# Patient Record
Sex: Female | Born: 1965 | Race: White | Hispanic: No | Marital: Married | Smoking: Former smoker
Health system: Southern US, Community
[De-identification: ages and names within clinical notes are randomized; demographics above are authoritative.]

## PROBLEM LIST (undated history)

## (undated) DIAGNOSIS — N6310 Unspecified lump in the right breast, unspecified quadrant: Secondary | ICD-10-CM

## (undated) DIAGNOSIS — C801 Malignant (primary) neoplasm, unspecified: Secondary | ICD-10-CM

## (undated) DIAGNOSIS — R519 Headache, unspecified: Secondary | ICD-10-CM

## (undated) DIAGNOSIS — Z800 Family history of malignant neoplasm of digestive organs: Secondary | ICD-10-CM

## (undated) DIAGNOSIS — D649 Anemia, unspecified: Secondary | ICD-10-CM

## (undated) DIAGNOSIS — Z9221 Personal history of antineoplastic chemotherapy: Secondary | ICD-10-CM

## (undated) DIAGNOSIS — G4730 Sleep apnea, unspecified: Secondary | ICD-10-CM

## (undated) DIAGNOSIS — K589 Irritable bowel syndrome without diarrhea: Secondary | ICD-10-CM

## (undated) DIAGNOSIS — K219 Gastro-esophageal reflux disease without esophagitis: Secondary | ICD-10-CM

## (undated) DIAGNOSIS — F419 Anxiety disorder, unspecified: Secondary | ICD-10-CM

## (undated) DIAGNOSIS — H469 Unspecified optic neuritis: Secondary | ICD-10-CM

## (undated) DIAGNOSIS — E039 Hypothyroidism, unspecified: Secondary | ICD-10-CM

## (undated) HISTORY — DX: Family history of malignant neoplasm of digestive organs: Z80.0

## (undated) HISTORY — DX: Unspecified lump in the right breast, unspecified quadrant: N63.10

## (undated) HISTORY — DX: Unspecified optic neuritis: H46.9

---

## 1989-05-02 HISTORY — PX: CHOLECYSTECTOMY: SHX55

## 1996-05-02 HISTORY — PX: TUBAL LIGATION: SHX77

## 2006-05-02 HISTORY — PX: ABDOMINAL HYSTERECTOMY: SHX81

## 2010-09-08 ENCOUNTER — Other Ambulatory Visit (HOSPITAL_COMMUNITY): Payer: Self-pay | Admitting: Internal Medicine

## 2010-09-08 DIAGNOSIS — E039 Hypothyroidism, unspecified: Secondary | ICD-10-CM

## 2010-09-14 ENCOUNTER — Ambulatory Visit (HOSPITAL_COMMUNITY)
Admission: RE | Admit: 2010-09-14 | Discharge: 2010-09-14 | Disposition: A | Discharge status: Home / Self Care | Payer: 59 | Source: Ambulatory Visit | Attending: Internal Medicine | Admitting: Internal Medicine

## 2010-09-14 DIAGNOSIS — E049 Nontoxic goiter, unspecified: Secondary | ICD-10-CM | POA: Insufficient documentation

## 2010-09-14 DIAGNOSIS — E039 Hypothyroidism, unspecified: Secondary | ICD-10-CM | POA: Insufficient documentation

## 2010-12-17 ENCOUNTER — Inpatient Hospital Stay (INDEPENDENT_AMBULATORY_CARE_PROVIDER_SITE_OTHER)
Admission: RE | Admit: 2010-12-17 | Discharge: 2010-12-17 | Disposition: A | Discharge status: Home / Self Care | Payer: 59 | Source: Ambulatory Visit | Attending: Family Medicine | Admitting: Family Medicine

## 2010-12-17 DIAGNOSIS — R109 Unspecified abdominal pain: Secondary | ICD-10-CM

## 2010-12-17 LAB — POCT URINALYSIS DIP (DEVICE)
Bilirubin Urine: NEGATIVE
Glucose, UA: NEGATIVE mg/dL
Ketones, ur: NEGATIVE mg/dL
Leukocytes, UA: NEGATIVE
Nitrite: NEGATIVE

## 2010-12-27 ENCOUNTER — Other Ambulatory Visit (HOSPITAL_COMMUNITY): Payer: Self-pay | Admitting: Internal Medicine

## 2010-12-27 DIAGNOSIS — R1031 Right lower quadrant pain: Secondary | ICD-10-CM

## 2010-12-28 ENCOUNTER — Other Ambulatory Visit (HOSPITAL_COMMUNITY): Payer: 59

## 2010-12-31 ENCOUNTER — Ambulatory Visit (HOSPITAL_COMMUNITY)
Admission: RE | Admit: 2010-12-31 | Discharge: 2010-12-31 | Disposition: A | Discharge status: Home / Self Care | Payer: 59 | Source: Ambulatory Visit | Attending: Internal Medicine | Admitting: Internal Medicine

## 2010-12-31 DIAGNOSIS — R1031 Right lower quadrant pain: Secondary | ICD-10-CM | POA: Insufficient documentation

## 2010-12-31 MED ORDER — IOHEXOL 300 MG/ML  SOLN
80.0000 mL | Freq: Once | INTRAMUSCULAR | Status: AC | PRN
  Administered 2010-12-31: 80 mL via INTRAVENOUS

## 2011-05-29 ENCOUNTER — Emergency Department (HOSPITAL_COMMUNITY)
Admission: EM | Admit: 2011-05-29 | Discharge: 2011-05-29 | Disposition: A | Discharge status: Home / Self Care | Payer: 59 | Source: Home / Self Care | Attending: Emergency Medicine | Admitting: Emergency Medicine

## 2011-05-29 ENCOUNTER — Encounter (HOSPITAL_COMMUNITY): Payer: Self-pay

## 2011-05-29 ENCOUNTER — Emergency Department (INDEPENDENT_AMBULATORY_CARE_PROVIDER_SITE_OTHER): Payer: 59

## 2011-05-29 DIAGNOSIS — S93609A Unspecified sprain of unspecified foot, initial encounter: Secondary | ICD-10-CM

## 2011-05-29 DIAGNOSIS — S93602A Unspecified sprain of left foot, initial encounter: Secondary | ICD-10-CM

## 2011-05-29 HISTORY — DX: Gastro-esophageal reflux disease without esophagitis: K21.9

## 2011-05-29 HISTORY — DX: Hypothyroidism, unspecified: E03.9

## 2011-05-29 HISTORY — DX: Anxiety disorder, unspecified: F41.9

## 2011-05-29 MED ORDER — HYDROCODONE-ACETAMINOPHEN 5-325 MG PO TABS: 2.0000 | ORAL_TABLET | ORAL | Status: AC | PRN

## 2011-05-29 NOTE — ED Provider Notes (Signed)
History     CSN: [id number]  Arrival date & time 05/29/11  0901   First MD Initiated Contact with Patient 05/29/11 [phone]      Chief Complaint  Patient presents with  . Foot Pain    (Consider location/radiation/quality/duration/timing/severity/associated sxs/prior treatment) HPI Comments: Patient reports one week of dull, achy, lateral left foot pain. Reports increased physical activity, but does not recall any specific trauma. Pain worse with ambulation. Has been taking 800 mg of ibuprofen with temporary relief, however states pain is getting worse. Notes some foot swelling starting of the past few days. No fevers, redness, bruising, gross deformity, numbness, weakness. No history of injury to his foot.  ROS as noted in HPI. All other ROS negative.   Patient is a 46 y.o. female presenting with lower extremity pain. The history is provided by the patient.  Foot Pain This is a new problem. The current episode started more than 1 week ago. The problem occurs constantly. The problem has been gradually worsening. The symptoms are aggravated by walking. The symptoms are relieved by NSAIDs. The treatment provided mild relief.    Past Medical History  Diagnosis Date  . Acid reflux   . Anxiety   . Hypothyroid     History reviewed. No pertinent past surgical history.  History reviewed. No pertinent family history.  History  Substance Use Topics  . Smoking status: Never Smoker   . Smokeless tobacco: Not on file  . Alcohol Use: Yes    OB History    Grav Para Term Preterm Abortions TAB SAB Ect Mult Living                  Review of Systems  Allergies  Codeine; Wellbutrin; and Zanaflex  Home Medications   Current Outpatient Rx  Name Route Sig Dispense Refill  . ESCITALOPRAM OXALATE 20 MG PO TABS Oral Take 20 mg by mouth daily.    Marland Kitchen ESOMEPRAZOLE MAGNESIUM 40 MG PO CPDR Oral Take 40 mg by mouth daily before breakfast.    . IBUPROFEN 800 MG PO TABS Oral Take 800 mg by mouth  every 8 (eight) hours as needed.    Marland Kitchen PRESCRIPTION MEDICATION  tirosent for her thyroid      BP 145/92  Pulse 86  Temp(Src) 98 F (36.7 C) (Oral)  Resp 16  SpO2 98%  Physical Exam  Nursing note and vitals reviewed. Constitutional: She is oriented to person, place, and time. She appears well-developed and well-nourished. No distress.  HENT:  Head: Normocephalic and atraumatic.  Eyes: Conjunctivae and EOM are normal.  Neck: Normal range of motion.  Cardiovascular: Regular rhythm.   Pulmonary/Chest: Effort normal.  Abdominal: She exhibits no distension.  Musculoskeletal: Normal range of motion.       Feet:       Left foot trace edema. Point tenderness base left fifth metatarsal. Ankle, heel, midfoot, toes within normal limits. No tenderness along plantar fascia. DP 2+. No bruising, redness. Skin intact.  Neurological: She is alert and oriented to person, place, and time.  Skin: Skin is warm and dry.  Psychiatric: She has a normal mood and affect. Her behavior is normal. Judgment and thought content normal.    ED Course  Procedures (including critical care time)  Labs Reviewed - No data to display Dg Foot Complete Left  05/29/2011  *RADIOLOGY REPORT*  Clinical Data: Lateral foot pain  LEFT FOOT - COMPLETE 3+ VIEW  Comparison: None  Findings: There is no evidence of fracture  or dislocation. There is a plantar heel spur.  There is no evidence of arthropathy or other focal bone abnormality.  Soft tissues are unremarkable.  IMPRESSION: 1.  Plantar heel spur. 2.  No acute findings.  Original Report Authenticated By: Rosealee Albee, M.D.     No diagnosis found. X-ray reviewed by myself. Report per radiologist.  MDM  Attending x-ray to rule out fracture. Suspect either stress fracture versus overuse. Will send patient home was stiff soled shoe, versus ASO , plus minus crutches. Will have her continue 800 mg ibuprofen 3 times a day. Sent home with Norco. Patient followup with [facility]  medicine center or Dr. Carola Frost, orthopedics on call in a week.   Luiz Blare, MD 05/29/11 1008

## 2011-05-29 NOTE — ED Notes (Signed)
Pt has lt foot pain for one week, no known injury.  Painful with ambulation.

## 2012-05-02 HISTORY — PX: BREAST EXCISIONAL BIOPSY: SUR124

## 2013-02-25 ENCOUNTER — Telehealth (INDEPENDENT_AMBULATORY_CARE_PROVIDER_SITE_OTHER): Payer: Self-pay | Admitting: General Surgery

## 2013-02-25 NOTE — Telephone Encounter (Signed)
LMOM for patient to call back and ask for me or triage. Calling to see if patient has a CD of mammogram and ultrasound from [facility]. I would like to have patient bring these to her appt on Wednesday if possible. Awaiting call back.

## 2013-02-26 ENCOUNTER — Telehealth (INDEPENDENT_AMBULATORY_CARE_PROVIDER_SITE_OTHER): Payer: Self-pay | Admitting: General Surgery

## 2013-02-26 NOTE — Telephone Encounter (Signed)
Patient called back and she will have her Mgm CD with her tomorrow for her Dr apt

## 2013-02-27 ENCOUNTER — Encounter (INDEPENDENT_AMBULATORY_CARE_PROVIDER_SITE_OTHER): Payer: Self-pay | Admitting: Surgery

## 2013-02-27 ENCOUNTER — Ambulatory Visit (INDEPENDENT_AMBULATORY_CARE_PROVIDER_SITE_OTHER): Payer: Commercial Managed Care - PPO | Admitting: Surgery

## 2013-02-27 VITALS — BP 128/74 | HR 76 | Temp 98.4°F | Resp 15 | Ht 71.0 in | Wt 251.4 lb

## 2013-02-27 DIAGNOSIS — N6310 Unspecified lump in the right breast, unspecified quadrant: Secondary | ICD-10-CM

## 2013-02-27 DIAGNOSIS — N63 Unspecified lump in unspecified breast: Secondary | ICD-10-CM

## 2013-02-27 HISTORY — DX: Unspecified lump in the right breast, unspecified quadrant: N63.10

## 2013-02-27 NOTE — Progress Notes (Signed)
NAME: Natalie Burton DOB: 07/29/1965 MRN: [id number]                                                                                      DATE: 02/27/2013  PCP: Melissa Joyce Brown-Patram Referring Provider: No ref. 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  I have discussed the indications for the lumpectomy and described the procedure. She understand that the chance of removal of the abnormal area is very good, but that occasionally we are unable to locate it and may have to do a second procedure. We also discussed the possibility of a second procedure to get additional tissue. Risks of surgery such as bleeding and infection have also been explained, as well as the implications of not doing the surgery. She understands and wishes to proceed.                  CC:  Chief Complaint  Patient presents with  . Breast Mass    Right    HPI:  Natalie Burton is a 47 y.o.  female who presents for evaluation of A right breast mass recently found.she was referred from her primary care physician. She had a mammogram which showed a potential abnormality and after further evaluation an ultrasound a small right subareolar mass was found. A needle core biopsy was done showing a papilloma. Surgical biopsy was recommended. She does not have any breast symptoms or breast problems. She is not had any prior breast surgery. Her mother apparently has had a mastectomy for breast cancer developed at age 55.  PMH:  has a past medical history of Acid reflux; Anxiety; and Hypothyroid.  PSH:   has past surgical history that includes Cholecystectomy; Tubal ligation; and Abdominal hysterectomy.  ALLERGIES:   Allergies  Allergen Reactions  . Codeine   . Wellbutrin [Bupropion Hcl]   . Zanaflex [Tizanidine Hydrochloride]     MEDICATIONS: Current outpatient prescriptions:Dexlansoprazole (DEXILANT PO), Take by  mouth., Disp: , Rfl: ;  esomeprazole (NEXIUM) 40 MG capsule, Take 40 mg by mouth daily before breakfast., Disp: , Rfl: ;  ibuprofen (ADVIL,MOTRIN) 800 MG tablet, Take 800 mg by mouth every 8 (eight) hours as needed., Disp: , Rfl: ;  levothyroxine (SYNTHROID, LEVOTHROID) 175 MCG tablet, Take 175 mcg by mouth daily before breakfast., Disp: , Rfl:  escitalopram (LEXAPRO) 20 MG tablet, Take 20 mg by mouth daily., Disp: , Rfl:   ROS: She has filled out our 12 point review of systems and it is negative . EXAM:   VITAL SIGNS:  BP 128/74  Pulse 76  Temp(Src) 98.4 F (36.9 C) (Temporal)  Resp 15  Ht 5' 11" (1.803 m)  Wt 251 lb 6.4 oz (114.034 kg)  BMI 35.08 kg/m2  GENERAL:  The patient is alert, oriented, and generally healthy-appearing, NAD. Mood and affect are normal.  HEENT:  The head is normocephalic, the eyes nonicteric, the pupils were round regular and equal. EOMs are normal. Pharynx normal. Dentition good.  NECK:  The neck is supple and there are no masses or thyromegaly.    LUNGS: Normal respirations and clear to auscultation.  HEART: Regular rhythm, with no murmurs rubs or gallops. Pulses are intact carotid dorsalis pedis and posterior tibial. No significant varicosities are noted.  BREASTS:  the breasts are symmetric in size and shape. They're nontender. There is no mass. The nipple and areolar areas appear normal.  LYMPHATICS: There is no axillary or supraclavicular adenopathy on either side.  ABDOMEN: Soft, flat, and nontender. No masses or organomegaly is noted. No hernias are noted. Bowel sounds are normal.  EXTREMITIES:  Good range of motion, no edema.   DATA REVIEWED:  I have reviewed the mammogram, ultrasound, pathology reports as well as her recent physical exam by her primary care provider.    Geneen Dieter J 02/27/2013  CC: No ref. provider found, Melissa Joyce Brown-Patram        

## 2013-02-27 NOTE — Patient Instructions (Signed)
Schedule surgery to remove the small papilloma from your right breast

## 2013-03-05 ENCOUNTER — Encounter (INDEPENDENT_AMBULATORY_CARE_PROVIDER_SITE_OTHER): Payer: Self-pay

## 2013-03-13 ENCOUNTER — Encounter (HOSPITAL_BASED_OUTPATIENT_CLINIC_OR_DEPARTMENT_OTHER): Payer: Self-pay | Admitting: *Deleted

## 2013-03-13 NOTE — Progress Notes (Signed)
No labs needed [facility]

## 2013-03-18 ENCOUNTER — Ambulatory Visit (HOSPITAL_BASED_OUTPATIENT_CLINIC_OR_DEPARTMENT_OTHER): Payer: 59 | Admitting: Certified Registered Nurse Anesthetist

## 2013-03-18 ENCOUNTER — Encounter (HOSPITAL_BASED_OUTPATIENT_CLINIC_OR_DEPARTMENT_OTHER): Payer: Self-pay | Admitting: Certified Registered Nurse Anesthetist

## 2013-03-18 ENCOUNTER — Ambulatory Visit
Admission: RE | Admit: 2013-03-18 | Discharge: 2013-03-18 | Disposition: A | Discharge status: Home / Self Care | Payer: 59 | Source: Ambulatory Visit | Attending: Surgery | Admitting: Surgery

## 2013-03-18 ENCOUNTER — Encounter (HOSPITAL_BASED_OUTPATIENT_CLINIC_OR_DEPARTMENT_OTHER): Payer: 59 | Admitting: Certified Registered Nurse Anesthetist

## 2013-03-18 ENCOUNTER — Ambulatory Visit (HOSPITAL_BASED_OUTPATIENT_CLINIC_OR_DEPARTMENT_OTHER)
Admission: RE | Admit: 2013-03-18 | Discharge: 2013-03-18 | Disposition: A | Discharge status: Home / Self Care | Payer: 59 | Source: Ambulatory Visit | Attending: Surgery | Admitting: Surgery

## 2013-03-18 ENCOUNTER — Encounter (HOSPITAL_BASED_OUTPATIENT_CLINIC_OR_DEPARTMENT_OTHER)
Admission: RE | Disposition: A | Discharge status: Home / Self Care | Payer: Self-pay | Source: Ambulatory Visit | Attending: Surgery

## 2013-03-18 DIAGNOSIS — E039 Hypothyroidism, unspecified: Secondary | ICD-10-CM | POA: Insufficient documentation

## 2013-03-18 DIAGNOSIS — K219 Gastro-esophageal reflux disease without esophagitis: Secondary | ICD-10-CM | POA: Insufficient documentation

## 2013-03-18 DIAGNOSIS — F411 Generalized anxiety disorder: Secondary | ICD-10-CM | POA: Insufficient documentation

## 2013-03-18 DIAGNOSIS — Z803 Family history of malignant neoplasm of breast: Secondary | ICD-10-CM | POA: Insufficient documentation

## 2013-03-18 DIAGNOSIS — N6310 Unspecified lump in the right breast, unspecified quadrant: Secondary | ICD-10-CM

## 2013-03-18 DIAGNOSIS — N6089 Other benign mammary dysplasias of unspecified breast: Secondary | ICD-10-CM

## 2013-03-18 DIAGNOSIS — D249 Benign neoplasm of unspecified breast: Secondary | ICD-10-CM

## 2013-03-18 DIAGNOSIS — F17200 Nicotine dependence, unspecified, uncomplicated: Secondary | ICD-10-CM | POA: Insufficient documentation

## 2013-03-18 HISTORY — PX: BREAST LUMPECTOMY WITH NEEDLE LOCALIZATION: SHX5759

## 2013-03-18 HISTORY — DX: Sleep apnea, unspecified: G47.30

## 2013-03-18 SURGERY — BREAST LUMPECTOMY WITH NEEDLE LOCALIZATION
Anesthesia: General | Site: Breast | Laterality: Right | Wound class: Clean

## 2013-03-18 MED ORDER — HYDROMORPHONE HCL PF 1 MG/ML IJ SOLN
INTRAMUSCULAR | Status: AC
  Filled 2013-03-18: qty 1

## 2013-03-18 MED ORDER — MIDAZOLAM HCL 5 MG/5ML IJ SOLN
INTRAMUSCULAR | Status: DC | PRN
  Administered 2013-03-18: 2 mg via INTRAVENOUS

## 2013-03-18 MED ORDER — OXYCODONE HCL 5 MG/5ML PO SOLN: 5.0000 mg | Freq: Once | ORAL | Status: DC | PRN

## 2013-03-18 MED ORDER — FENTANYL CITRATE 0.05 MG/ML IJ SOLN
INTRAMUSCULAR | Status: DC | PRN
  Administered 2013-03-18 (×2): 50 ug via INTRAVENOUS

## 2013-03-18 MED ORDER — OXYCODONE HCL 5 MG PO TABS: 5.0000 mg | ORAL_TABLET | Freq: Once | ORAL | Status: DC | PRN

## 2013-03-18 MED ORDER — CHLORHEXIDINE GLUCONATE 4 % EX LIQD: 1.0000 "application " | Freq: Once | CUTANEOUS | Status: DC

## 2013-03-18 MED ORDER — PROPOFOL 10 MG/ML IV BOLUS
INTRAVENOUS | Status: DC | PRN
  Administered 2013-03-18: 150 mg via INTRAVENOUS

## 2013-03-18 MED ORDER — CEFAZOLIN SODIUM-DEXTROSE 2-3 GM-% IV SOLR
2.0000 g | INTRAVENOUS | Status: AC
  Administered 2013-03-18: 2 g via INTRAVENOUS

## 2013-03-18 MED ORDER — BUPIVACAINE HCL (PF) 0.25 % IJ SOLN
INTRAMUSCULAR | Status: AC
  Filled 2013-03-18: qty 30

## 2013-03-18 MED ORDER — ONDANSETRON HCL 4 MG/2ML IJ SOLN
INTRAMUSCULAR | Status: DC | PRN
  Administered 2013-03-18: 4 mg via INTRAVENOUS

## 2013-03-18 MED ORDER — DEXAMETHASONE SODIUM PHOSPHATE 4 MG/ML IJ SOLN
INTRAMUSCULAR | Status: DC | PRN
  Administered 2013-03-18: 10 mg via INTRAVENOUS

## 2013-03-18 MED ORDER — MIDAZOLAM HCL 2 MG/2ML IJ SOLN: 1.0000 mg | INTRAMUSCULAR | Status: DC | PRN

## 2013-03-18 MED ORDER — OXYCODONE-ACETAMINOPHEN 5-325 MG PO TABS: 1.0000 | ORAL_TABLET | ORAL | Status: DC | PRN

## 2013-03-18 MED ORDER — MIDAZOLAM HCL 2 MG/2ML IJ SOLN
INTRAMUSCULAR | Status: AC
  Filled 2013-03-18: qty 2

## 2013-03-18 MED ORDER — FENTANYL CITRATE 0.05 MG/ML IJ SOLN
INTRAMUSCULAR | Status: AC
  Filled 2013-03-18: qty 6

## 2013-03-18 MED ORDER — LACTATED RINGERS IV SOLN
INTRAVENOUS | Status: DC
  Administered 2013-03-18: 09:00:00 via INTRAVENOUS
  Administered 2013-03-18: 20 mL/h via INTRAVENOUS

## 2013-03-18 MED ORDER — ONDANSETRON HCL 4 MG/2ML IJ SOLN: 4.0000 mg | Freq: Once | INTRAMUSCULAR | Status: DC | PRN

## 2013-03-18 MED ORDER — MEPERIDINE HCL 25 MG/ML IJ SOLN: 6.2500 mg | INTRAMUSCULAR | Status: DC | PRN

## 2013-03-18 MED ORDER — HYDROMORPHONE HCL PF 1 MG/ML IJ SOLN
0.2500 mg | INTRAMUSCULAR | Status: DC | PRN
  Administered 2013-03-18 (×2): 0.5 mg via INTRAVENOUS

## 2013-03-18 MED ORDER — FENTANYL CITRATE 0.05 MG/ML IJ SOLN: 50.0000 ug | INTRAMUSCULAR | Status: DC | PRN

## 2013-03-18 MED ORDER — CEFAZOLIN SODIUM 1-5 GM-% IV SOLN
INTRAVENOUS | Status: AC
  Filled 2013-03-18: qty 100

## 2013-03-18 MED ORDER — PROPOFOL 10 MG/ML IV EMUL
INTRAVENOUS | Status: AC
  Filled 2013-03-18: qty 50

## 2013-03-18 MED ORDER — LIDOCAINE HCL (CARDIAC) 20 MG/ML IV SOLN
INTRAVENOUS | Status: DC | PRN
  Administered 2013-03-18: 60 mg via INTRAVENOUS

## 2013-03-18 MED ORDER — BUPIVACAINE HCL (PF) 0.25 % IJ SOLN
INTRAMUSCULAR | Status: DC | PRN
  Administered 2013-03-18: 20 mL

## 2013-03-18 SURGICAL SUPPLY — 46 items
APPLICATOR COTTON TIP 6IN STRL (MISCELLANEOUS) IMPLANT
BINDER BREAST LRG (GAUZE/BANDAGES/DRESSINGS) IMPLANT
BINDER BREAST MEDIUM (GAUZE/BANDAGES/DRESSINGS) IMPLANT
BINDER BREAST XLRG (GAUZE/BANDAGES/DRESSINGS) ×2 IMPLANT
BINDER BREAST XXLRG (GAUZE/BANDAGES/DRESSINGS) IMPLANT
BLADE HEX COATED 2.75 (ELECTRODE) ×2 IMPLANT
BLADE SURG 15 STRL LF DISP TIS (BLADE) ×1 IMPLANT
BLADE SURG 15 STRL SS (BLADE) ×1
CANISTER SUCT 1200ML W/VALVE (MISCELLANEOUS) ×2 IMPLANT
CHLORAPREP W/TINT 26ML (MISCELLANEOUS) ×2 IMPLANT
CLIP TI MEDIUM 6 (CLIP) IMPLANT
CLIP TI WIDE RED SMALL 6 (CLIP) IMPLANT
COVER MAYO STAND STRL (DRAPES) ×2 IMPLANT
COVER TABLE BACK 60X90 (DRAPES) ×2 IMPLANT
DECANTER SPIKE VIAL GLASS SM (MISCELLANEOUS) IMPLANT
DERMABOND ADVANCED (GAUZE/BANDAGES/DRESSINGS) ×1
DERMABOND ADVANCED .7 DNX12 (GAUZE/BANDAGES/DRESSINGS) ×1 IMPLANT
DEVICE DUBIN W/COMP PLATE 8390 (MISCELLANEOUS) ×2 IMPLANT
DRAPE LAPAROTOMY TRNSV 102X78 (DRAPE) ×2 IMPLANT
DRAPE SURG 17X23 STRL (DRAPES) IMPLANT
DRAPE UTILITY XL STRL (DRAPES) ×2 IMPLANT
ELECT REM PT RETURN 9FT ADLT (ELECTROSURGICAL) ×2
ELECTRODE REM PT RTRN 9FT ADLT (ELECTROSURGICAL) ×1 IMPLANT
GLOVE BIO SURGEON STRL SZ 6.5 (GLOVE) ×2 IMPLANT
GLOVE BIOGEL PI IND STRL 7.0 (GLOVE) ×1 IMPLANT
GLOVE BIOGEL PI INDICATOR 7.0 (GLOVE) ×1
GLOVE EUDERMIC 7 POWDERFREE (GLOVE) ×2 IMPLANT
GOWN PREVENTION PLUS XLARGE (GOWN DISPOSABLE) ×4 IMPLANT
KIT MARKER MARGIN INK (KITS) IMPLANT
NEEDLE HYPO 25X1 1.5 SAFETY (NEEDLE) ×2 IMPLANT
NS IRRIG 1000ML POUR BTL (IV SOLUTION) ×2 IMPLANT
PACK BASIN DAY SURGERY FS (CUSTOM PROCEDURE TRAY) ×2 IMPLANT
PENCIL BUTTON HOLSTER BLD 10FT (ELECTRODE) ×2 IMPLANT
SHEET MEDIUM DRAPE 40X70 STRL (DRAPES) IMPLANT
SLEEVE SCD COMPRESS KNEE MED (MISCELLANEOUS) ×2 IMPLANT
SPONGE GAUZE 4X4 12PLY (GAUZE/BANDAGES/DRESSINGS) IMPLANT
SPONGE INTESTINAL PEANUT (DISPOSABLE) IMPLANT
SPONGE LAP 4X18 X RAY DECT (DISPOSABLE) ×2 IMPLANT
STAPLER VISISTAT 35W (STAPLE) IMPLANT
SUT MNCRL AB 4-0 PS2 18 (SUTURE) ×2 IMPLANT
SUT SILK 0 TIES 10X30 (SUTURE) IMPLANT
SUT VICRYL 3-0 CR8 SH (SUTURE) ×2 IMPLANT
SYR CONTROL 10ML LL (SYRINGE) ×2 IMPLANT
TOWEL OR NON WOVEN STRL DISP B (DISPOSABLE) IMPLANT
TUBE CONNECTING 20X1/4 (TUBING) ×2 IMPLANT
YANKAUER SUCT BULB TIP NO VENT (SUCTIONS) ×2 IMPLANT

## 2013-03-18 NOTE — Interval H&P Note (Signed)
History and Physical Interval Note:  03/18/2013 8:46 AM  Natalie Burton  has presented today for surgery, with the diagnosis of breast mass  The various methods of treatment have been discussed with the patient and family. After consideration of risks, benefits and other options for treatment, the patient has consented to  Procedure(s): BREAST LUMPECTOMY WITH NEEDLE LOCALIZATION (Right) as a surgical intervention .  The patient's history has been reviewed, patient examined, no change in status, stable for surgery.  I have reviewed the patient's chart and labs.  Questions were answered to the patient's satisfaction.     Chanise Habeck J

## 2013-03-18 NOTE — Anesthesia Procedure Notes (Signed)
Procedure Name: LMA Insertion Date/Time: 03/18/2013 9:15 AM Performed by: Currie Paris Pre-anesthesia Checklist: Patient identified, Emergency Drugs available, Suction available and Patient being monitored Patient Re-evaluated:Patient Re-evaluated prior to inductionOxygen Delivery Method: Circle System Utilized Preoxygenation: Pre-oxygenation with 100% oxygen Intubation Type: IV induction Ventilation: Mask ventilation without difficulty LMA: LMA inserted LMA Size: 4.0 Number of attempts: 1 Airway Equipment and Method: bite block Placement Confirmation: positive ETCO2 Tube secured with: Tape Dental Injury: Teeth and Oropharynx as per pre-operative assessment

## 2013-03-18 NOTE — Addendum Note (Signed)
Addendum created 03/18/13 1319 by Arta Bruce, MD   Modules edited: Anesthesia Attestations

## 2013-03-18 NOTE — H&P (View-Only) (Signed)
NAME: Natalie Burton DOB: 05-28-1965 MRN: [id number]                                                                                      DATE: 02/27/2013  PCP: Krystal Clark Referring Provider: No ref. 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  I have discussed the indications for the lumpectomy and described the procedure. She understand that the chance of removal of the abnormal area is very good, but that occasionally we are unable to locate it and may have to do a second procedure. We also discussed the possibility of a second procedure to get additional tissue. Risks of surgery such as bleeding and infection have also been explained, as well as the implications of not doing the surgery. She understands and wishes to proceed.                  CC:  Chief Complaint  Patient presents with  . Breast Mass    Right    HPI:  Natalie Burton is a 47 y.o.  female who presents for evaluation of A right breast mass recently found.she was referred from her primary care physician. She had a mammogram which showed a potential abnormality and after further evaluation an ultrasound a small right subareolar mass was found. A needle core biopsy was done showing a papilloma. Surgical biopsy was recommended. She does not have any breast symptoms or breast problems. She is not had any prior breast surgery. Her mother apparently has had a mastectomy for breast cancer developed at age 59.  PMH:  has a past medical history of Acid reflux; Anxiety; and Hypothyroid.  PSH:   has past surgical history that includes Cholecystectomy; Tubal ligation; and Abdominal hysterectomy.  ALLERGIES:   Allergies  Allergen Reactions  . Codeine   . Wellbutrin [Bupropion Hcl]   . Zanaflex [Tizanidine Hydrochloride]     MEDICATIONS: Current outpatient prescriptions:Dexlansoprazole (DEXILANT PO), Take by  mouth., Disp: , Rfl: ;  esomeprazole (NEXIUM) 40 MG capsule, Take 40 mg by mouth daily before breakfast., Disp: , Rfl: ;  ibuprofen (ADVIL,MOTRIN) 800 MG tablet, Take 800 mg by mouth every 8 (eight) hours as needed., Disp: , Rfl: ;  levothyroxine (SYNTHROID, LEVOTHROID) 175 MCG tablet, Take 175 mcg by mouth daily before breakfast., Disp: , Rfl:  escitalopram (LEXAPRO) 20 MG tablet, Take 20 mg by mouth daily., Disp: , Rfl:   ROS: She has filled out our 12 point review of systems and it is negative . EXAM:   VITAL SIGNS:  BP 128/74  Pulse 76  Temp(Src) 98.4 F (36.9 C) (Temporal)  Resp 15  Ht 5\' 11"  (1.803 m)  Wt 251 lb 6.4 oz (114.034 kg)  BMI 35.08 kg/m2  GENERAL:  The patient is alert, oriented, and generally healthy-appearing, NAD. Mood and affect are normal.  HEENT:  The head is normocephalic, the eyes nonicteric, the pupils were round regular and equal. EOMs are normal. Pharynx normal. Dentition good.  NECK:  The neck is supple and there are no masses or thyromegaly.  LUNGS: Normal respirations and clear to auscultation.  HEART: Regular rhythm, with no murmurs rubs or gallops. Pulses are intact carotid dorsalis pedis and posterior tibial. No significant varicosities are noted.  BREASTS:  the breasts are symmetric in size and shape. They're nontender. There is no mass. The nipple and areolar areas appear normal.  LYMPHATICS: There is no axillary or supraclavicular adenopathy on either side.  ABDOMEN: Soft, flat, and nontender. No masses or organomegaly is noted. No hernias are noted. Bowel sounds are normal.  EXTREMITIES:  Good range of motion, no edema.   DATA REVIEWED:  I have reviewed the mammogram, ultrasound, pathology reports as well as her recent physical exam by her primary care provider.    Mikala Podoll J 02/27/2013  CC: No ref. provider found, Melissa Richardson Dopp

## 2013-03-18 NOTE — Transfer of Care (Signed)
Immediate Anesthesia Transfer of Care Note  Patient: Natalie Burton  Procedure(s) Performed: Procedure(s): BREAST LUMPECTOMY WITH NEEDLE LOCALIZATION (Right)  Patient Location: PACU  Anesthesia Type:General  Level of Consciousness: awake, alert , oriented and patient cooperative  Airway & Oxygen Therapy: Patient Spontanous Breathing and Patient connected to face mask oxygen  Post-op Assessment: Report given to PACU RN and Post -op Vital signs reviewed and stable  Post vital signs: Reviewed and stable  Complications: No apparent anesthesia complications

## 2013-03-18 NOTE — Op Note (Signed)
Natalie Burton  1965/05/10  [id number]  03/18/2013   Preoperative diagnosis: papilloma, right subareolar area  Postoperative diagnosis: same  Procedure: wire localized removal of right breast papilloma  Surgeon: Currie Paris, MD, FACS  Anesthesia: GA combined with regional for post-op pain  Clinical History and Indications: this patient presents for a guidewire localized excision of a right breast Mass, papilloma by needle core biopsy.  Description of procedure: The patient was seen in the holding area and the plans for the procedure reviewed. The right breast was marked as the operative side. The wire localizing films were reviewed.  The patient was taken to the operating room and after satisfactory general anesthesia had been obtained the right breast was prepped and draped and the timeout was performed.  The guidewire entered laterally and tracked towards the nipple. The clip from the biopsy appeared to be subareolar. I made a curvilinear incision at the lateral aspect of the areola, manipulated the guidewire into the wound, grasped the breast tissue around the guidewire and excise the tissue around it going beyond the tip.in the very superficial area I saw what appeared to be an old hematoma from her prior biopsy.  After achieving hemostasis, I infiltrated the area with 20 cc of 0.25% Marcaine and then the incision was closed with 3-0 Vicryl, 4-0 Monocryl subcuticular, and Dermabond.  The patient tolerated the procedure well. There were no operative complications. All counts were correct. Specimen mammogram showed the clip in the specimen.  EBL: minimal  Currie Paris, MD, FACS 03/18/2013 9:59 AM

## 2013-03-18 NOTE — Anesthesia Postprocedure Evaluation (Signed)
Anesthesia Post Note  Patient: Natalie Burton  Procedure(s) Performed: Procedure(s) (LRB): BREAST LUMPECTOMY WITH NEEDLE LOCALIZATION (Right)  Anesthesia type: general  Patient location: PACU  Post pain: Pain level controlled  Post assessment: Patient's Cardiovascular Status Stable  Last Vitals:  Filed Vitals:   03/18/13 1220  BP: 142/81  Pulse:   Temp:   Resp:     Post vital signs: Reviewed and stable  Level of consciousness: sedated  Complications: No apparent anesthesia complications

## 2013-03-18 NOTE — Anesthesia Preprocedure Evaluation (Addendum)
Anesthesia Evaluation  Patient identified by MRN, date of birth, ID band Patient awake    Reviewed: Allergy & Precautions, NPO status   Airway Mallampati: I TM Distance: >3 FB Neck ROM: Full    Dental   Pulmonary sleep apnea , Current Smoker,          Cardiovascular     Neuro/Psych PSYCHIATRIC DISORDERS Anxiety    GI/Hepatic GERD-  Medicated,  Endo/Other  Hypothyroidism   Renal/GU      Musculoskeletal   Abdominal   Peds  Hematology   Anesthesia Other Findings   Reproductive/Obstetrics                         Anesthesia Physical Anesthesia Plan  ASA: II  Anesthesia Plan: General   Post-op Pain Management:    Induction: Intravenous  Airway Management Planned: LMA  Additional Equipment:   Intra-op Plan:   Post-operative Plan: Extubation in OR  Informed Consent: I have reviewed the patients History and Physical, chart, labs and discussed the procedure including the risks, benefits and alternatives for the proposed anesthesia with the patient or authorized representative who has indicated his/her understanding and acceptance.     Plan Discussed with: CRNA and Surgeon  Anesthesia Plan Comments:        Anesthesia Quick Evaluation

## 2013-03-19 ENCOUNTER — Encounter (HOSPITAL_BASED_OUTPATIENT_CLINIC_OR_DEPARTMENT_OTHER): Payer: Self-pay | Admitting: Surgery

## 2013-03-20 ENCOUNTER — Telehealth (INDEPENDENT_AMBULATORY_CARE_PROVIDER_SITE_OTHER): Payer: Self-pay | Admitting: General Surgery

## 2013-03-20 ENCOUNTER — Encounter (INDEPENDENT_AMBULATORY_CARE_PROVIDER_SITE_OTHER): Payer: Self-pay | Admitting: Surgery

## 2013-03-20 NOTE — Telephone Encounter (Signed)
Message copied by Liliana Cline on Wed Mar 20, 2013  4:10 PM ------      Message from: Natalie Burton      Created: Wed Mar 20, 2013  3:25 PM       Pt would like a callback with test results please...[phone] ------

## 2013-03-20 NOTE — Telephone Encounter (Signed)
Patient made aware results not back yet. Will call when we have them.

## 2013-03-21 ENCOUNTER — Telehealth (INDEPENDENT_AMBULATORY_CARE_PROVIDER_SITE_OTHER): Payer: Self-pay | Admitting: General Surgery

## 2013-03-21 NOTE — Telephone Encounter (Signed)
Message copied by Liliana Cline on Thu Mar 21, 2013  2:52 PM ------      Message from: Currie Paris      Created: Thu Mar 21, 2013  2:39 PM       Tell her path is benign and as expected. A papilloma. I will review in detail in the office ------

## 2013-03-21 NOTE — Telephone Encounter (Signed)
Patient made aware of path results. Will follow up at appt and call with any questions prior. Patient okay with change in appt time on 04/10/2013.

## 2013-03-26 ENCOUNTER — Ambulatory Visit (INDEPENDENT_AMBULATORY_CARE_PROVIDER_SITE_OTHER): Payer: Commercial Managed Care - PPO | Admitting: Surgery

## 2013-03-26 ENCOUNTER — Encounter (INDEPENDENT_AMBULATORY_CARE_PROVIDER_SITE_OTHER): Payer: Self-pay | Admitting: Surgery

## 2013-03-26 VITALS — BP 132/80 | HR 78 | Temp 97.0°F | Resp 14 | Ht 71.0 in | Wt 248.6 lb

## 2013-03-26 DIAGNOSIS — Z9889 Other specified postprocedural states: Secondary | ICD-10-CM | POA: Insufficient documentation

## 2013-03-26 NOTE — Progress Notes (Signed)
Patient returns after right breast mass excision from just under the right nipple but Streck last week. She had some changes to her nipple one to check. Pathology showed complex lesion papilloma.  Exam: There is some mild skin changes to the right nipple. Small amount of slough noted. This encompasses about 10% of the lateral portion of the nipple. Otherwise nipple viable.  Impression: Status post excision right breast lesion with benign pathology and mild nipple slough  Plan: Local wound care. Reassurance. Return to see Streck in about 10 days. Call condition worsens.

## 2013-03-26 NOTE — Patient Instructions (Signed)
Apply neosporin to nipple daily.  Expect some slough.  Keep follow up

## 2013-04-10 ENCOUNTER — Ambulatory Visit (INDEPENDENT_AMBULATORY_CARE_PROVIDER_SITE_OTHER): Payer: Commercial Managed Care - PPO | Admitting: Surgery

## 2013-04-10 ENCOUNTER — Encounter (INDEPENDENT_AMBULATORY_CARE_PROVIDER_SITE_OTHER): Payer: Self-pay | Admitting: Surgery

## 2013-04-10 VITALS — BP 122/72 | HR 64 | Resp 14 | Ht 71.0 in | Wt 247.6 lb

## 2013-04-10 DIAGNOSIS — Z9889 Other specified postprocedural states: Secondary | ICD-10-CM

## 2013-04-10 DIAGNOSIS — N63 Unspecified lump in unspecified breast: Secondary | ICD-10-CM

## 2013-04-10 DIAGNOSIS — N6310 Unspecified lump in the right breast, unspecified quadrant: Secondary | ICD-10-CM

## 2013-04-10 NOTE — Patient Instructions (Signed)
We will see you again on an as needed basis. Please call the office at [phone] if you have any questions or concerns. Thank you for allowing us to take care of you.  

## 2013-04-10 NOTE — Progress Notes (Signed)
NAME: Natalie Burton                                            DOB: 10-Nov-1965 DATE: 04/10/2013                                                   MRN: [id number]  CC:  Chief Complaint  Patient presents with  . po breast lumpectomy    HPI: This patient comes in for post op follow-up .Sheunderwent Right ductal excisioin on 03/18/2013. She feels that she is doing well.  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 Microscopic Comment There are portions of complex sclerosing lesion with areas of adenosis and intraductal papillomas. There are focal proliferative duct lesions consistent with atypical duct hyperplasia which is focally less than 0.1 cm from the margin of the specimen. Dr. Dierdre Searles has reviewed this case and agrees. (JDP:gt, 03/21/13) Jimmy Picket MD Pathologist, Electronic Signature (Case signed 03/21/2013)  IMPRESSION: The patient is doing well S/P ductal excision on right.    PLAN: RTC PRN I gave the patient a copy of the pathology report and reviewed it with her

## 2014-01-27 ENCOUNTER — Other Ambulatory Visit: Payer: Self-pay

## 2014-01-27 ENCOUNTER — Other Ambulatory Visit: Payer: Self-pay | Admitting: Internal Medicine

## 2014-01-27 DIAGNOSIS — Z853 Personal history of malignant neoplasm of breast: Secondary | ICD-10-CM

## 2014-01-27 DIAGNOSIS — Z9889 Other specified postprocedural states: Secondary | ICD-10-CM

## 2014-02-04 ENCOUNTER — Other Ambulatory Visit: Payer: Self-pay | Admitting: Internal Medicine

## 2014-02-04 ENCOUNTER — Ambulatory Visit
Admission: RE | Admit: 2014-02-04 | Discharge: 2014-02-04 | Disposition: A | Discharge status: Home / Self Care | Payer: 59 | Source: Ambulatory Visit | Attending: Internal Medicine | Admitting: Internal Medicine

## 2014-02-04 DIAGNOSIS — Z9889 Other specified postprocedural states: Secondary | ICD-10-CM

## 2014-02-04 DIAGNOSIS — Z853 Personal history of malignant neoplasm of breast: Secondary | ICD-10-CM

## 2014-02-04 DIAGNOSIS — Z1231 Encounter for screening mammogram for malignant neoplasm of breast: Secondary | ICD-10-CM

## 2015-04-21 ENCOUNTER — Other Ambulatory Visit: Payer: Self-pay

## 2015-04-21 DIAGNOSIS — Z1231 Encounter for screening mammogram for malignant neoplasm of breast: Secondary | ICD-10-CM

## 2015-05-26 ENCOUNTER — Ambulatory Visit
Admission: RE | Admit: 2015-05-26 | Discharge: 2015-05-26 | Disposition: A | Discharge status: Home / Self Care | Payer: 59 | Source: Ambulatory Visit

## 2015-05-26 DIAGNOSIS — Z1231 Encounter for screening mammogram for malignant neoplasm of breast: Secondary | ICD-10-CM

## 2015-06-01 MED FILL — PHENTERMINE 37.5 MG TABLET: 37.5 | 30 days supply | Qty: 30 | Fill #2

## 2015-06-02 ENCOUNTER — Ambulatory Visit: Payer: Self-pay | Admitting: Allergy and Immunology

## 2015-06-12 DIAGNOSIS — K581 Irritable bowel syndrome with constipation: Secondary | ICD-10-CM | POA: Diagnosis not present

## 2015-06-15 MED FILL — SYNTHROID 200 MCG TABLET: 200 | 90 days supply | Qty: 90 | Fill #0

## 2015-06-15 MED FILL — ESOMEPRAZOLE MAG DR 40 MG C: 40 | 90 days supply | Qty: 180 | Fill #1

## 2015-06-16 ENCOUNTER — Ambulatory Visit: Payer: Self-pay | Admitting: Allergy and Immunology

## 2015-06-23 ENCOUNTER — Ambulatory Visit: Payer: Self-pay | Admitting: Allergy and Immunology

## 2015-06-30 ENCOUNTER — Ambulatory Visit: Payer: Self-pay | Admitting: Allergy and Immunology

## 2015-07-03 MED FILL — LINZESS 145 MCG CAPSULE: 145 | 30 days supply | Qty: 30 | Fill #0

## 2015-07-07 ENCOUNTER — Ambulatory Visit: Payer: Self-pay | Admitting: Allergy and Immunology

## 2015-07-08 DIAGNOSIS — M25511 Pain in right shoulder: Secondary | ICD-10-CM | POA: Diagnosis not present

## 2015-08-24 DIAGNOSIS — E559 Vitamin D deficiency, unspecified: Secondary | ICD-10-CM | POA: Insufficient documentation

## 2015-08-24 DIAGNOSIS — F339 Major depressive disorder, recurrent, unspecified: Secondary | ICD-10-CM | POA: Insufficient documentation

## 2015-08-24 DIAGNOSIS — R5381 Other malaise: Secondary | ICD-10-CM | POA: Insufficient documentation

## 2015-08-24 DIAGNOSIS — E039 Hypothyroidism, unspecified: Secondary | ICD-10-CM | POA: Insufficient documentation

## 2015-08-24 DIAGNOSIS — E782 Mixed hyperlipidemia: Secondary | ICD-10-CM | POA: Insufficient documentation

## 2015-08-25 ENCOUNTER — Other Ambulatory Visit (HOSPITAL_COMMUNITY): Payer: Self-pay | Admitting: Internal Medicine

## 2015-08-25 ENCOUNTER — Encounter (HOSPITAL_COMMUNITY): Payer: Self-pay

## 2015-08-25 ENCOUNTER — Ambulatory Visit (HOSPITAL_COMMUNITY)
Admission: RE | Admit: 2015-08-25 | Discharge: 2015-08-25 | Disposition: A | Discharge status: Home / Self Care | Payer: 59 | Source: Ambulatory Visit | Attending: Internal Medicine | Admitting: Internal Medicine

## 2015-08-25 DIAGNOSIS — Z9049 Acquired absence of other specified parts of digestive tract: Secondary | ICD-10-CM | POA: Diagnosis not present

## 2015-08-25 DIAGNOSIS — R1084 Generalized abdominal pain: Secondary | ICD-10-CM

## 2015-08-25 DIAGNOSIS — K219 Gastro-esophageal reflux disease without esophagitis: Secondary | ICD-10-CM | POA: Diagnosis not present

## 2015-08-25 DIAGNOSIS — Z90710 Acquired absence of both cervix and uterus: Secondary | ICD-10-CM | POA: Insufficient documentation

## 2015-08-25 DIAGNOSIS — R1033 Periumbilical pain: Secondary | ICD-10-CM | POA: Diagnosis not present

## 2015-08-25 DIAGNOSIS — R110 Nausea: Secondary | ICD-10-CM | POA: Diagnosis not present

## 2015-08-25 MED ORDER — BARIUM SULFATE 2.1 % PO SUSP
ORAL | Status: AC
  Filled 2015-08-25: qty 2

## 2015-08-25 MED ORDER — IOPAMIDOL (ISOVUE-300) INJECTION 61%
INTRAVENOUS | Status: AC
  Administered 2015-08-25: 100 mL
  Filled 2015-08-25: qty 100

## 2015-09-07 MED FILL — SYNTHROID 200 MCG TABLET: 200 | 90 days supply | Qty: 90 | Fill #1

## 2015-10-28 DIAGNOSIS — R232 Flushing: Secondary | ICD-10-CM | POA: Insufficient documentation

## 2015-10-28 DIAGNOSIS — E039 Hypothyroidism, unspecified: Secondary | ICD-10-CM | POA: Diagnosis not present

## 2015-10-28 DIAGNOSIS — F330 Major depressive disorder, recurrent, mild: Secondary | ICD-10-CM | POA: Diagnosis not present

## 2015-10-28 DIAGNOSIS — E782 Mixed hyperlipidemia: Secondary | ICD-10-CM | POA: Diagnosis not present

## 2015-10-28 DIAGNOSIS — G4733 Obstructive sleep apnea (adult) (pediatric): Secondary | ICD-10-CM | POA: Diagnosis not present

## 2015-10-28 DIAGNOSIS — R5383 Other fatigue: Secondary | ICD-10-CM | POA: Diagnosis not present

## 2015-10-28 DIAGNOSIS — R5381 Other malaise: Secondary | ICD-10-CM | POA: Diagnosis not present

## 2015-10-28 DIAGNOSIS — K219 Gastro-esophageal reflux disease without esophagitis: Secondary | ICD-10-CM | POA: Diagnosis not present

## 2015-10-28 DIAGNOSIS — E559 Vitamin D deficiency, unspecified: Secondary | ICD-10-CM | POA: Diagnosis not present

## 2015-10-28 MED FILL — ESOMEPRAZOLE MAG DR 40 MG C: 40 | 90 days supply | Qty: 180 | Fill #0

## 2015-12-14 MED FILL — SYNTHROID 200 MCG TABLET: 200 | 90 days supply | Qty: 90 | Fill #2

## 2016-01-06 DIAGNOSIS — E782 Mixed hyperlipidemia: Secondary | ICD-10-CM | POA: Diagnosis not present

## 2016-01-06 DIAGNOSIS — K219 Gastro-esophageal reflux disease without esophagitis: Secondary | ICD-10-CM | POA: Diagnosis not present

## 2016-01-06 DIAGNOSIS — E039 Hypothyroidism, unspecified: Secondary | ICD-10-CM | POA: Diagnosis not present

## 2016-01-06 DIAGNOSIS — B351 Tinea unguium: Secondary | ICD-10-CM | POA: Diagnosis not present

## 2016-01-06 DIAGNOSIS — R5381 Other malaise: Secondary | ICD-10-CM | POA: Diagnosis not present

## 2016-01-06 DIAGNOSIS — F330 Major depressive disorder, recurrent, mild: Secondary | ICD-10-CM | POA: Diagnosis not present

## 2016-01-06 DIAGNOSIS — R232 Flushing: Secondary | ICD-10-CM | POA: Diagnosis not present

## 2016-01-06 DIAGNOSIS — Z6835 Body mass index (BMI) 35.0-35.9, adult: Secondary | ICD-10-CM | POA: Insufficient documentation

## 2016-01-06 DIAGNOSIS — E669 Obesity, unspecified: Secondary | ICD-10-CM | POA: Diagnosis not present

## 2016-01-06 DIAGNOSIS — E559 Vitamin D deficiency, unspecified: Secondary | ICD-10-CM | POA: Diagnosis not present

## 2016-01-06 MED FILL — FLUCONAZOLE 100 MG TABLET: 100 | 30 days supply | Qty: 30 | Fill #0

## 2016-03-21 MED FILL — SYNTHROID 200 MCG TABLET: 200 | 90 days supply | Qty: 90 | Fill #3

## 2016-03-23 DIAGNOSIS — L821 Other seborrheic keratosis: Secondary | ICD-10-CM | POA: Diagnosis not present

## 2016-03-23 DIAGNOSIS — D1801 Hemangioma of skin and subcutaneous tissue: Secondary | ICD-10-CM | POA: Diagnosis not present

## 2016-03-23 DIAGNOSIS — D2239 Melanocytic nevi of other parts of face: Secondary | ICD-10-CM | POA: Diagnosis not present

## 2016-03-23 DIAGNOSIS — D225 Melanocytic nevi of trunk: Secondary | ICD-10-CM | POA: Diagnosis not present

## 2016-03-23 DIAGNOSIS — D485 Neoplasm of uncertain behavior of skin: Secondary | ICD-10-CM | POA: Diagnosis not present

## 2016-03-29 MED FILL — ESOMEPRAZOLE MAG DR 40 MG C: 40 | 90 days supply | Qty: 180 | Fill #1

## 2016-04-23 DIAGNOSIS — S65212A Laceration of superficial palmar arch of left hand, initial encounter: Secondary | ICD-10-CM | POA: Diagnosis not present

## 2016-05-09 MED FILL — PHENTERMINE 37.5 MG TABLET: 37.5 | 30 days supply | Qty: 30 | Fill #0

## 2016-06-08 ENCOUNTER — Other Ambulatory Visit: Payer: Self-pay | Admitting: Specialist

## 2016-06-08 DIAGNOSIS — Z1231 Encounter for screening mammogram for malignant neoplasm of breast: Secondary | ICD-10-CM

## 2016-06-27 MED FILL — FLUCONAZOLE 100 MG TABLET: 100 | 30 days supply | Qty: 30 | Fill #1

## 2016-06-27 MED FILL — PHENTERMINE 37.5 MG TABLET: 37.5 | 30 days supply | Qty: 30 | Fill #1

## 2016-07-05 ENCOUNTER — Ambulatory Visit
Admission: RE | Admit: 2016-07-05 | Discharge: 2016-07-05 | Disposition: A | Discharge status: Home / Self Care | Payer: 59 | Source: Ambulatory Visit | Attending: Specialist | Admitting: Specialist

## 2016-07-05 DIAGNOSIS — Z1231 Encounter for screening mammogram for malignant neoplasm of breast: Secondary | ICD-10-CM

## 2016-07-05 MED FILL — SYNTHROID 200 MCG TABLET: 200 | 90 days supply | Qty: 90 | Fill #0

## 2016-07-22 DIAGNOSIS — Z800 Family history of malignant neoplasm of digestive organs: Secondary | ICD-10-CM | POA: Diagnosis not present

## 2016-07-27 DIAGNOSIS — R5383 Other fatigue: Secondary | ICD-10-CM | POA: Diagnosis not present

## 2016-07-27 DIAGNOSIS — R232 Flushing: Secondary | ICD-10-CM | POA: Diagnosis not present

## 2016-07-27 DIAGNOSIS — R5381 Other malaise: Secondary | ICD-10-CM | POA: Diagnosis not present

## 2016-07-27 DIAGNOSIS — F330 Major depressive disorder, recurrent, mild: Secondary | ICD-10-CM | POA: Diagnosis not present

## 2016-07-27 DIAGNOSIS — G4733 Obstructive sleep apnea (adult) (pediatric): Secondary | ICD-10-CM | POA: Diagnosis not present

## 2016-07-27 DIAGNOSIS — K219 Gastro-esophageal reflux disease without esophagitis: Secondary | ICD-10-CM | POA: Diagnosis not present

## 2016-07-27 DIAGNOSIS — E669 Obesity, unspecified: Secondary | ICD-10-CM | POA: Diagnosis not present

## 2016-07-27 DIAGNOSIS — E782 Mixed hyperlipidemia: Secondary | ICD-10-CM | POA: Diagnosis not present

## 2016-07-27 DIAGNOSIS — E559 Vitamin D deficiency, unspecified: Secondary | ICD-10-CM | POA: Diagnosis not present

## 2016-07-27 DIAGNOSIS — E039 Hypothyroidism, unspecified: Secondary | ICD-10-CM | POA: Diagnosis not present

## 2016-07-27 MED FILL — ESOMEPRAZOLE MAG DR 40 MG C: 40 | 90 days supply | Qty: 180 | Fill #0

## 2016-07-27 MED FILL — CYCLOBENZAPRINE 10 MG TAB: 10 | 30 days supply | Qty: 90 | Fill #0

## 2016-07-29 MED FILL — CLENPIQ 10-3.5-12 MG-GM -GM: 10-3.5-12 M | 1 days supply | Qty: 320 | Fill #0

## 2016-08-15 MED FILL — PHENTERMINE 37.5 MG TABLET: 37.5 | 30 days supply | Qty: 30 | Fill #2

## 2016-08-19 DIAGNOSIS — H524 Presbyopia: Secondary | ICD-10-CM | POA: Diagnosis not present

## 2016-08-19 DIAGNOSIS — H5213 Myopia, bilateral: Secondary | ICD-10-CM | POA: Diagnosis not present

## 2016-08-19 DIAGNOSIS — H52223 Regular astigmatism, bilateral: Secondary | ICD-10-CM | POA: Diagnosis not present

## 2016-08-24 DIAGNOSIS — K648 Other hemorrhoids: Secondary | ICD-10-CM | POA: Diagnosis not present

## 2016-08-24 DIAGNOSIS — D122 Benign neoplasm of ascending colon: Secondary | ICD-10-CM | POA: Diagnosis not present

## 2016-08-24 DIAGNOSIS — Z800 Family history of malignant neoplasm of digestive organs: Secondary | ICD-10-CM | POA: Diagnosis not present

## 2016-08-24 DIAGNOSIS — E039 Hypothyroidism, unspecified: Secondary | ICD-10-CM | POA: Diagnosis not present

## 2016-08-24 DIAGNOSIS — Z1211 Encounter for screening for malignant neoplasm of colon: Secondary | ICD-10-CM | POA: Diagnosis not present

## 2016-08-24 DIAGNOSIS — F17200 Nicotine dependence, unspecified, uncomplicated: Secondary | ICD-10-CM | POA: Diagnosis not present

## 2016-08-24 DIAGNOSIS — K649 Unspecified hemorrhoids: Secondary | ICD-10-CM | POA: Diagnosis not present

## 2016-08-24 DIAGNOSIS — K635 Polyp of colon: Secondary | ICD-10-CM | POA: Diagnosis not present

## 2016-08-24 HISTORY — PX: COLONOSCOPY: SHX174

## 2016-09-20 MED FILL — PHENTERMINE 37.5 MG TABLET: 37.5 | 30 days supply | Qty: 30 | Fill #3

## 2016-09-28 MED FILL — SYNTHROID 200 MCG TABLET: 200 | 90 days supply | Qty: 90 | Fill #1

## 2016-10-27 MED FILL — PHENTERMINE 37.5 MG TABLET: 37.5 | 30 days supply | Qty: 30 | Fill #0

## 2016-11-14 DIAGNOSIS — D105 Benign neoplasm of other parts of oropharynx: Secondary | ICD-10-CM | POA: Diagnosis not present

## 2016-11-14 DIAGNOSIS — H6980 Other specified disorders of Eustachian tube, unspecified ear: Secondary | ICD-10-CM | POA: Diagnosis not present

## 2016-11-14 DIAGNOSIS — H9203 Otalgia, bilateral: Secondary | ICD-10-CM | POA: Diagnosis not present

## 2016-11-14 DIAGNOSIS — J342 Deviated nasal septum: Secondary | ICD-10-CM | POA: Diagnosis not present

## 2016-11-14 DIAGNOSIS — Z8739 Personal history of other diseases of the musculoskeletal system and connective tissue: Secondary | ICD-10-CM | POA: Diagnosis not present

## 2016-11-14 MED FILL — ESOMEPRAZOLE MAG DR 40 MG C: 40 | 90 days supply | Qty: 180 | Fill #1

## 2016-12-07 MED FILL — PHENTERMINE 37.5 MG TABLET: 37.5 | 30 days supply | Qty: 30 | Fill #1

## 2017-01-03 MED FILL — SYNTHROID 200 MCG TABLET: 200 | 90 days supply | Qty: 90 | Fill #2

## 2017-01-26 MED FILL — PHENTERMINE 37.5 MG TABLET: 37.5 | 30 days supply | Qty: 30 | Fill #0

## 2017-02-21 MED FILL — ESOMEPRAZOLE MAG DR 40 MG C: 40 | 90 days supply | Qty: 180 | Fill #0

## 2017-03-07 DIAGNOSIS — R682 Dry mouth, unspecified: Secondary | ICD-10-CM | POA: Diagnosis not present

## 2017-03-07 DIAGNOSIS — R5381 Other malaise: Secondary | ICD-10-CM | POA: Diagnosis not present

## 2017-03-07 DIAGNOSIS — E039 Hypothyroidism, unspecified: Secondary | ICD-10-CM | POA: Diagnosis not present

## 2017-03-07 DIAGNOSIS — F330 Major depressive disorder, recurrent, mild: Secondary | ICD-10-CM | POA: Diagnosis not present

## 2017-03-07 DIAGNOSIS — R5383 Other fatigue: Secondary | ICD-10-CM | POA: Diagnosis not present

## 2017-03-07 DIAGNOSIS — K219 Gastro-esophageal reflux disease without esophagitis: Secondary | ICD-10-CM | POA: Diagnosis not present

## 2017-03-22 MED FILL — VENLAFAXINE HCL ER 37.5 MG: 37.5 | 90 days supply | Qty: 90 | Fill #0

## 2017-03-27 MED FILL — SYNTHROID 200 MCG TABLET: 200 | 90 days supply | Qty: 90 | Fill #3

## 2017-05-16 DIAGNOSIS — R079 Chest pain, unspecified: Secondary | ICD-10-CM | POA: Diagnosis not present

## 2017-05-16 DIAGNOSIS — E039 Hypothyroidism, unspecified: Secondary | ICD-10-CM | POA: Diagnosis not present

## 2017-05-16 DIAGNOSIS — F330 Major depressive disorder, recurrent, mild: Secondary | ICD-10-CM | POA: Diagnosis not present

## 2017-05-16 DIAGNOSIS — R0602 Shortness of breath: Secondary | ICD-10-CM | POA: Diagnosis not present

## 2017-05-16 MED FILL — ESCITALOPRAM 10 MG TABLET: 10 | 90 days supply | Qty: 90 | Fill #0

## 2017-05-17 ENCOUNTER — Other Ambulatory Visit (HOSPITAL_BASED_OUTPATIENT_CLINIC_OR_DEPARTMENT_OTHER): Payer: Self-pay | Admitting: Specialist

## 2017-05-17 ENCOUNTER — Other Ambulatory Visit (HOSPITAL_COMMUNITY): Payer: Self-pay | Admitting: Specialist

## 2017-05-17 DIAGNOSIS — R7889 Finding of other specified substances, not normally found in blood: Secondary | ICD-10-CM

## 2017-05-17 DIAGNOSIS — R9431 Abnormal electrocardiogram [ECG] [EKG]: Secondary | ICD-10-CM

## 2017-05-17 MED FILL — SYNTHROID 175 MCG TABLET: 175 | 90 days supply | Qty: 90 | Fill #0

## 2017-05-18 ENCOUNTER — Ambulatory Visit (HOSPITAL_BASED_OUTPATIENT_CLINIC_OR_DEPARTMENT_OTHER)
Admission: RE | Admit: 2017-05-18 | Discharge: 2017-05-18 | Disposition: A | Discharge status: Home / Self Care | Payer: 59 | Source: Ambulatory Visit | Attending: Specialist | Admitting: Specialist

## 2017-05-18 ENCOUNTER — Ambulatory Visit (HOSPITAL_COMMUNITY)
Admission: RE | Admit: 2017-05-18 | Discharge: 2017-05-18 | Disposition: A | Discharge status: Home / Self Care | Payer: 59 | Source: Ambulatory Visit | Attending: Specialist | Admitting: Specialist

## 2017-05-18 DIAGNOSIS — J9811 Atelectasis: Secondary | ICD-10-CM | POA: Insufficient documentation

## 2017-05-18 DIAGNOSIS — R7989 Other specified abnormal findings of blood chemistry: Secondary | ICD-10-CM | POA: Diagnosis not present

## 2017-05-18 DIAGNOSIS — I517 Cardiomegaly: Secondary | ICD-10-CM | POA: Diagnosis not present

## 2017-05-18 DIAGNOSIS — Z9049 Acquired absence of other specified parts of digestive tract: Secondary | ICD-10-CM | POA: Insufficient documentation

## 2017-05-18 DIAGNOSIS — R9431 Abnormal electrocardiogram [ECG] [EKG]: Secondary | ICD-10-CM | POA: Diagnosis not present

## 2017-05-18 DIAGNOSIS — R7889 Finding of other specified substances, not normally found in blood: Secondary | ICD-10-CM

## 2017-05-18 MED ORDER — IOPAMIDOL (ISOVUE-370) INJECTION 76%
INTRAVENOUS | Status: AC
  Filled 2017-05-18: qty 100

## 2017-05-18 MED ORDER — IOPAMIDOL (ISOVUE-370) INJECTION 76%
100.0000 mL | Freq: Once | INTRAVENOUS | Status: AC | PRN
  Administered 2017-05-18: 100 mL via INTRAVENOUS

## 2017-05-18 NOTE — Progress Notes (Signed)
  Echocardiogram 2D Echocardiogram has been performed.  Sumedh Shinsato T Arieanna Pressey 05/18/2017, 11:37 AM

## 2017-05-23 ENCOUNTER — Emergency Department (HOSPITAL_COMMUNITY)
Admission: EM | Admit: 2017-05-23 | Discharge: 2017-05-24 | Disposition: A | Discharge status: Home / Self Care | Payer: 59 | Attending: Emergency Medicine | Admitting: Emergency Medicine

## 2017-05-23 ENCOUNTER — Encounter (HOSPITAL_COMMUNITY): Payer: Self-pay

## 2017-05-23 ENCOUNTER — Other Ambulatory Visit: Payer: Self-pay

## 2017-05-23 ENCOUNTER — Emergency Department (HOSPITAL_COMMUNITY): Payer: 59

## 2017-05-23 DIAGNOSIS — R0789 Other chest pain: Secondary | ICD-10-CM | POA: Diagnosis not present

## 2017-05-23 DIAGNOSIS — R079 Chest pain, unspecified: Secondary | ICD-10-CM | POA: Diagnosis not present

## 2017-05-23 DIAGNOSIS — E039 Hypothyroidism, unspecified: Secondary | ICD-10-CM | POA: Insufficient documentation

## 2017-05-23 DIAGNOSIS — Z79899 Other long term (current) drug therapy: Secondary | ICD-10-CM | POA: Diagnosis not present

## 2017-05-23 DIAGNOSIS — F17200 Nicotine dependence, unspecified, uncomplicated: Secondary | ICD-10-CM | POA: Diagnosis not present

## 2017-05-23 DIAGNOSIS — I517 Cardiomegaly: Secondary | ICD-10-CM | POA: Insufficient documentation

## 2017-05-23 DIAGNOSIS — R0602 Shortness of breath: Secondary | ICD-10-CM | POA: Insufficient documentation

## 2017-05-23 LAB — CBC
HEMATOCRIT: 41 % (ref 36.0–46.0)
Hemoglobin: 13.8 g/dL (ref 12.0–15.0)
MCH: 30.2 pg (ref 26.0–34.0)
MCHC: 33.7 g/dL (ref 30.0–36.0)
MCV: 89.7 fL (ref 78.0–100.0)
Platelets: 234 10*3/uL (ref 150–400)
RBC: 4.57 MIL/uL (ref 3.87–5.11)
RDW: 11.9 % (ref 11.5–15.5)
WBC: 6.8 10*3/uL (ref 4.0–10.5)

## 2017-05-23 LAB — BASIC METABOLIC PANEL
Anion gap: 13 (ref 5–15)
BUN: 11 mg/dL (ref 6–20)
CHLORIDE: 103 mmol/L (ref 101–111)
CO2: 24 mmol/L (ref 22–32)
Calcium: 9.2 mg/dL (ref 8.9–10.3)
Creatinine, Ser: 0.78 mg/dL (ref 0.44–1.00)
GFR calc Af Amer: >60 mL/min (ref >60)
GFR calc non Af Amer: >60 mL/min (ref >60)
GLUCOSE: 113 mg/dL — AB (ref 65–99)
POTASSIUM: 3.8 mmol/L (ref 3.5–5.1)
Sodium: 140 mmol/L (ref 135–145)

## 2017-05-23 LAB — I-STAT TROPONIN, ED: Troponin i, poc: 0 ng/mL (ref 0.00–0.08)

## 2017-05-23 NOTE — ED Triage Notes (Signed)
Pt presents with onset of mid-sternal chest pain and shortness of breath x 2 weeks that is intermittent; pt worked up for elevated d-dimer with negative CT of chest; echo done; diagnosed with enlarged heart.  Pt reports tonight, pain was the worst, radiated into mid-scapula.

## 2017-05-23 NOTE — ED Provider Notes (Signed)
[facility] EMERGENCY DEPARTMENT Provider Note   CSN: [id number] Arrival date & time: 05/23/17  2157     History   Chief Complaint No chief complaint on file.   HPI Natalie Burton is a 52 y.o. female with history of hypothyroidism, anxiety who presents with a 2-week history of intermittent chest pain.  Patient reports the pain occurs randomly out of nowhere.  It is not associated with exertion or at rest.  Usually lasts a few minutes up to 30 minutes.  She describes a combination of pressure and sharp pains.  She has had associated shortness of breath with it.  She denies pleuritic symptoms.  She recently had a basically normal echocardiogram (mild LVH, mild mitral and tricuspid regurgitation, EF 60-65%) as well as a negative CT angios study through her PCP.  PCP plans to refer her to cardiology.  Patient came tonight because her pain was little more severe than usual.  It resolved on its own.  She has been taking ibuprofen without relief.  Patient reports having similar symptoms several years ago from a medication reaction to Wellbutrin.  Patient stopped taking this medication at the time, however she had been on Effexor, which she stopped 2 weeks ago.  This medication is a similar ingredient and patient feels it may be related.  She has no history of panic attacks.  She has been under more stress recently.  She denies any history of blood clots, recent long trips, surgeries, known cancer, new leg pain or swelling, exogenous estrogen use.  HPI  Past Medical History:  Diagnosis Date  . Acid reflux   . Anxiety   . Breast mass, right 02/27/2013   Excision 03/18/13. Papilloma with focal ADH   . Hypothyroid   . Sleep apnea    uses a cpap    Patient Active Problem List   Diagnosis Date Noted  . Post-operative state 03/26/2013    Past Surgical History:  Procedure Laterality Date  . ABDOMINAL HYSTERECTOMY  2008   vag  . BREAST EXCISIONAL BIOPSY Right 2014  .  BREAST LUMPECTOMY WITH NEEDLE LOCALIZATION Right 03/18/2013   Procedure: BREAST LUMPECTOMY WITH NEEDLE LOCALIZATION;  Surgeon: Haywood Lasso, MD;  Location: [facility];  Service: General;  Laterality: Right;  . CHOLECYSTECTOMY  1991   lap choli  . TUBAL LIGATION  1998    OB History    No data available       Home Medications    Prior to Admission medications   Medication Sig Start Date End Date Taking? Authorizing Provider  escitalopram (LEXAPRO) 20 MG tablet Take 20 mg by mouth daily.   Yes [provider]  esomeprazole (NEXIUM) 40 MG capsule Take 40 mg by mouth daily before breakfast.   Yes [provider]  ibuprofen (ADVIL,MOTRIN) 800 MG tablet Take 800 mg by mouth every 8 (eight) hours as needed for moderate pain.    Yes [provider]  levothyroxine (SYNTHROID, LEVOTHROID) 200 MCG tablet Take 175 mcg by mouth daily before breakfast.    Yes [provider]  Nutritional Supplements (ESTROVEN PO) Take 1 tablet by mouth daily.   Yes [provider]    Family History Family History  Problem Relation Age of Onset  . Cancer Mother        colon, breast  . Breast cancer Mother   . Cancer Father        non hodgkins lymphoma    Social History Social History  Tobacco Use  . Smoking status: Current Some Day Smoker    Packs/day: 0.25  . Smokeless tobacco: Never Used  Substance Use Topics  . Alcohol use: Yes  . Drug use: No     Allergies   Wellbutrin [bupropion hcl]; Codeine; and Zanaflex [tizanidine hydrochloride]   Review of Systems Review of Systems  Constitutional: Negative for chills and fever.  HENT: Negative for facial swelling and sore throat.   Respiratory: Positive for shortness of breath.   Cardiovascular: Positive for chest pain.  Gastrointestinal: Negative for abdominal pain, nausea and vomiting.  Genitourinary: Negative for dysuria.  Musculoskeletal: Negative for back pain.  Skin:  Negative for rash and wound.  Neurological: Negative for headaches.  Psychiatric/Behavioral: The patient is not nervous/anxious.      Physical Exam Updated Vital Signs BP 116/67   Pulse 74   Temp 97.9 F (36.6 C) (Oral)   Resp 18   Ht 5\' 11"  (1.803 m)   Wt 108.9 kg (240 lb)   SpO2 94%   BMI 33.47 kg/m   Physical Exam  Constitutional: She appears well-developed and well-nourished. No distress.  HENT:  Head: Normocephalic and atraumatic.  Mouth/Throat: Oropharynx is clear and moist. No oropharyngeal exudate.  Eyes: Conjunctivae are normal. Pupils are equal, round, and reactive to light. Right eye exhibits no discharge. Left eye exhibits no discharge. No scleral icterus.  Neck: Normal range of motion. Neck supple. No thyromegaly present.  Cardiovascular: Normal rate, regular rhythm, normal heart sounds and intact distal pulses. Exam reveals no gallop and no friction rub.  No murmur heard. Pulmonary/Chest: Effort normal and breath sounds normal. No stridor. No respiratory distress. She has no wheezes. She has no rales. She exhibits no tenderness.  Abdominal: Soft. Bowel sounds are normal. She exhibits no distension. There is no tenderness. There is no rebound and no guarding.  Musculoskeletal: She exhibits no edema.  Lymphadenopathy:    She has no cervical adenopathy.  Neurological: She is alert. Coordination normal.  Skin: Skin is warm and dry. No rash noted. She is not diaphoretic. No pallor.  Psychiatric: She has a normal mood and affect.  Nursing note and vitals reviewed.    ED Treatments / Results  Labs (all labs ordered are listed, but only abnormal results are displayed) Labs Reviewed  BASIC METABOLIC PANEL - Abnormal; Notable for the following components:      Result Value   Glucose, Bld 113 (*)    All other components within normal limits  CBC  I-STAT TROPONIN, ED  I-STAT TROPONIN, ED    EKG  EKG Interpretation  Date/Time:  Tuesday May 23 2017 22:00:40  EST Ventricular Rate:  74 PR Interval:  158 QRS Duration: 88 QT Interval:  404 QTC Calculation: 448 R Axis:   23 Text Interpretation:  Normal sinus rhythm No old tracing to compare Confirmed by Pryor Curia [phone]) on 05/24/2017 1:57:50 AM       Radiology Dg Chest 2 View  Result Date: 05/23/2017 CLINICAL DATA:  52 year old with central chest pain and shortness of breath. EXAM: CHEST  2 VIEW COMPARISON:  Chest CT 05/18/2017. FINDINGS: Both lungs are clear. Heart and mediastinum are within normal limits. Trachea is midline. No large pleural effusions. No acute bone abnormality. IMPRESSION: No active cardiopulmonary disease. Electronically Signed   By: Markus Daft M.D.   On: 05/23/2017 22:31    Procedures Procedures (including critical care time)  Medications Ordered in ED Medications - No data to display   Initial Impression /  Assessment and Plan / ED Course  I have reviewed the triage vital signs and the nursing notes.  Pertinent labs & imaging results that were available during my care of the patient were reviewed by me and considered in my medical decision making (see chart for details).     Patient reporting 2-week history of intermittent chest pain.  She has had extensive workup by her primary care provider including negative CTA chest and echocardiogram which showed mild LVH and mild mitral and tricuspid regurgitation.  [place] troponin is negative.  Chest x-ray is negative.  CBC, BMP negative.  EKG shows NSR.  Patient's HEART score is 1.  I feel patient is safe for follow-up to cardiology for further evaluation.  Patient symptoms may be related to side effect from medication versus anxiety as well.  Return precautions discussed.  Patient understands and agrees with plan.  Patient vitals stable and discharged in satisfactory condition. I discussed patient case with Dr. Leonides Schanz who guided the patient's management and agrees with plan.   Final Clinical Impressions(s) / ED Diagnoses    Final diagnoses:  Nonspecific chest pain    ED Discharge Orders    None       Frederica Kuster, PA-C 05/24/17 [place], Delice Bison, DO 05/24/17 [id number]

## 2017-05-24 DIAGNOSIS — Z79899 Other long term (current) drug therapy: Secondary | ICD-10-CM | POA: Diagnosis not present

## 2017-05-24 DIAGNOSIS — R0602 Shortness of breath: Secondary | ICD-10-CM | POA: Diagnosis not present

## 2017-05-24 DIAGNOSIS — F17200 Nicotine dependence, unspecified, uncomplicated: Secondary | ICD-10-CM | POA: Diagnosis not present

## 2017-05-24 DIAGNOSIS — R079 Chest pain, unspecified: Secondary | ICD-10-CM | POA: Diagnosis not present

## 2017-05-24 DIAGNOSIS — E039 Hypothyroidism, unspecified: Secondary | ICD-10-CM | POA: Diagnosis not present

## 2017-05-24 LAB — I-STAT TROPONIN, ED: Troponin i, poc: 0 ng/mL (ref 0.00–0.08)

## 2017-05-24 NOTE — Discharge Instructions (Signed)
Please follow-up with cardiology, either the office below or whoever your primary care provider refers you.  Please return to the emergency department if you develop any new or worsening symptoms.

## 2017-05-31 ENCOUNTER — Encounter: Payer: Self-pay | Admitting: Cardiology

## 2017-05-31 ENCOUNTER — Ambulatory Visit (INDEPENDENT_AMBULATORY_CARE_PROVIDER_SITE_OTHER): Payer: 59 | Admitting: Cardiology

## 2017-05-31 DIAGNOSIS — F419 Anxiety disorder, unspecified: Secondary | ICD-10-CM | POA: Diagnosis not present

## 2017-05-31 DIAGNOSIS — G4733 Obstructive sleep apnea (adult) (pediatric): Secondary | ICD-10-CM

## 2017-05-31 DIAGNOSIS — R0789 Other chest pain: Secondary | ICD-10-CM

## 2017-05-31 DIAGNOSIS — G4730 Sleep apnea, unspecified: Secondary | ICD-10-CM | POA: Insufficient documentation

## 2017-05-31 DIAGNOSIS — R0609 Other forms of dyspnea: Secondary | ICD-10-CM | POA: Diagnosis not present

## 2017-05-31 NOTE — Patient Instructions (Addendum)
Medication Instructions:  Your physician recommends that you continue on your current medications as directed. Please refer to the Current Medication list given to you today.  Labwork: Your physician recommends that you have lab work today: Lipid panel  Testing/Procedures: Your physician has requested that you have a stress echocardiogram. For further information please visit [email or web address]. Please follow instruction sheet as given.  EKG Today  Follow-Up: Your physician recommends that you schedule a follow-up appointment in: 1 month with Dr. Agustin Cree  Any Other Special Instructions Will Be Listed Below (If Applicable).     If you need a refill on your cardiac medications before your next appointment, please call your pharmacy.

## 2017-05-31 NOTE — Progress Notes (Signed)
Cardiology Consultation:    Date:  05/31/2017   ID:  Natalie Burton, DOB [birth year], MRN [id number]  PCP:  [name] (Inactive)  Cardiologist:  Jenne Campus, MD   Referring MD: Raina Mina., MD   Chief Complaint  Patient presents with  . Chest Pain  . Shortness of Breath  Having some shortness of breath and atypical chest pain  History of Present Illness:    Natalie Burton is a 52 y.o. female who is being seen today for the evaluation of chest pain and shortness of breath at the request of Raina Mina., MD.  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  She is being experiencing exertional shortness of breath and some chest pain for a few months.  She is to exercise on the regular basis but in November stopped doing it.  Initially she ended up getting plantar fasciitis and after that around Christmas time.  She started experiencing some shortness of breath that happen with exertion as well as some chest tightness that happen at rest chest sensation can last for a few minutes and it is clearly not related to exercise.  There are no aggravating or relieving factor.  She graded the sensation is 2-3 scale up to 10.  She had similar sensation couple years ago when she was put on Wellbutrin.  Eventually this medication has been withdrawn and then symptoms subsided.  She was recently put on Effexor and she thinks the symptoms could be related to that medication this medication has been withdrawn 2 weeks ago she thinks she may be feeling better but not perfect yet.  She is to exercise on the regular basis but now she is afraid to do it because of concern about her heart.  Years ago she did have a stress test done which was negative.  Recently she had echocardiogram which showed borderline left ventricular hypertrophy.  She also end up getting to the emergency room for some heaviness in the chest troponins were negative d-dimer was positive but CT Angie was negative for  PE.  Past Medical History:  Diagnosis Date  . Acid reflux   . Anxiety   . Breast mass, right 02/27/2013   Excision 03/18/13. Papilloma with focal ADH   . Hypothyroid   . Sleep apnea    uses a cpap    Past Surgical History:  Procedure Laterality Date  . ABDOMINAL HYSTERECTOMY  2008   vag  . BREAST EXCISIONAL BIOPSY Right 2014  . BREAST LUMPECTOMY WITH NEEDLE LOCALIZATION Right 03/18/2013   Procedure: BREAST LUMPECTOMY WITH NEEDLE LOCALIZATION;  Surgeon: Haywood Lasso, MD;  Location: [facility];  Service: General;  Laterality: Right;  . CHOLECYSTECTOMY  1991   lap choli  . TUBAL LIGATION  1998    Current Medications: Current Meds  Medication Sig  . escitalopram (LEXAPRO) 20 MG tablet Take 20 mg by mouth daily.  Marland Kitchen esomeprazole (NEXIUM) 40 MG capsule Take 40 mg by mouth daily before breakfast.  . ibuprofen (ADVIL,MOTRIN) 800 MG tablet Take 800 mg by mouth every 8 (eight) hours as needed for moderate pain.   Marland Kitchen levothyroxine (SYNTHROID) 175 MCG tablet Take by mouth.  . Nutritional Supplements (ESTROVEN PO) Take 1 tablet by mouth daily.     Allergies:   Wellbutrin [bupropion hcl]; Codeine; and Zanaflex [tizanidine hydrochloride]   Social History   Socioeconomic History  . Marital status: Married    Spouse name: None  . Number of children:  None  . Years of education: None  . Highest education level: None  Social Needs  . Financial resource strain: None  . Food insecurity - worry: None  . Food insecurity - inability: None  . Transportation needs - medical: None  . Transportation needs - non-medical: None  Occupational History  . None  Tobacco Use  . Smoking status: Former Smoker    Packs/day: 0.25    Last attempt to quit: 05/31/2016    Years since quitting: 1.0  . Smokeless tobacco: Never Used  Substance and Sexual Activity  . Alcohol use: Yes  . Drug use: No  . Sexual activity: None  Other Topics Concern  . None  Social History Narrative  .  None     Family History: The patient's family history includes Breast cancer in her mother; Cancer in her father and mother. ROS:   Please see the history of present illness.    All 14 point review of systems negative except as described per history of present illness.  EKGs/Labs/Other Studies Reviewed:    The following studies were reviewed today: CT Angie of the chest reviewed was negative for PE    Recent Labs: 05/23/2017: BUN 11; Creatinine, Ser 0.78; Hemoglobin 13.8; Platelets 234; Potassium 3.8; Sodium 140  Recent Lipid Panel No results found for: CHOL, TRIG, HDL, CHOLHDL, VLDL, LDLCALC, LDLDIRECT  Physical Exam:    VS:  BP 130/88 (BP Location: Right Arm, Patient Position: Sitting, Cuff Size: Normal)   Pulse 68   Ht 5\' 11"  (1.803 m)   Wt 244 lb (110.7 kg)   SpO2 98%   BMI 34.03 kg/m     Wt Readings from Last 3 Encounters:  05/31/17 244 lb (110.7 kg)  05/23/17 240 lb (108.9 kg)  04/10/13 247 lb 9.6 oz (112.3 kg)     GEN:  Well nourished, well developed in no acute distress HEENT: Normal NECK: No JVD; No carotid bruits LYMPHATICS: No lymphadenopathy CARDIAC: RRR, no murmurs, no rubs, no gallops RESPIRATORY:  Clear to auscultation without rales, wheezing or rhonchi  ABDOMEN: Soft, non-tender, non-distended MUSCULOSKELETAL:  No edema; No deformity  SKIN: Warm and dry NEUROLOGIC:  Alert and oriented x 3 PSYCHIATRIC:  Normal affect   ASSESSMENT:    1. Atypical chest pain   2. Anxiety   3. Dyspnea on exertion    PLAN:    In order of problems listed above:  1. Atypical chest pain: Likely she does not have too many risk factors for coronary artery disease.  She quit smoking a year ago, I do not see cholesterol and will do the test.  Will ask her to have stress test and will do stress echocardiogram to rule out ischemia.  I have I rather low level of suspicion for her to have significant coronary artery disease but I think we reached the point that need to be  clarified. 2. Anxiety: She is being put on Lexapro just 2 weeks ago she cannot tell if there is any difference she thinks it may be helping somewhat.  She was on this medication before which seems to be helping a lot.  We did talk also potentially about seeing some psychotherapist with psychotherapy that could help with the situation. 3. Dyspnea on exertion: Echocardiogram showed preserved left ventricular ejection fraction with mild degree of left ventricle hypertrophy which is kind of surprising since he does not have any hypertension.  This is something we can I watch EKG will be done today to check for  LVH.   Medication Adjustments/Labs and Tests Ordered: Current medicines are reviewed at length with the patient today.  Concerns regarding medicines are outlined above.  No orders of the defined types were placed in this encounter.  No orders of the defined types were placed in this encounter.   Signed, Park Liter, MD, [facility]. 05/31/2017 9:43 AM    [facility]

## 2017-06-01 LAB — LIPID PANEL
CHOL/HDL RATIO: 3.4 ratio (ref 0.0–4.4)
Cholesterol, Total: 239 mg/dL — ABNORMAL HIGH (ref 100–199)
HDL: 71 mg/dL (ref >39)
LDL CALC: 143 mg/dL — AB (ref 0–99)
TRIGLYCERIDES: 124 mg/dL (ref 0–149)
VLDL CHOLESTEROL CAL: 25 mg/dL (ref 5–40)

## 2017-06-13 MED FILL — ESOMEPRAZOLE MAG DR 40 MG C: 40 | 90 days supply | Qty: 180 | Fill #0

## 2017-06-21 ENCOUNTER — Ambulatory Visit (HOSPITAL_BASED_OUTPATIENT_CLINIC_OR_DEPARTMENT_OTHER)
Admission: RE | Admit: 2017-06-21 | Discharge: 2017-06-21 | Disposition: A | Discharge status: Home / Self Care | Payer: 59 | Source: Ambulatory Visit | Attending: Cardiology | Admitting: Cardiology

## 2017-06-21 DIAGNOSIS — R0600 Dyspnea, unspecified: Secondary | ICD-10-CM | POA: Insufficient documentation

## 2017-06-21 DIAGNOSIS — R0789 Other chest pain: Secondary | ICD-10-CM | POA: Insufficient documentation

## 2017-06-21 NOTE — Progress Notes (Signed)
Echocardiogram Echocardiogram Stress Test has been performed.  Joelene Millin 06/21/2017, 12:22 PM

## 2017-06-27 DIAGNOSIS — L820 Inflamed seborrheic keratosis: Secondary | ICD-10-CM | POA: Diagnosis not present

## 2017-06-27 DIAGNOSIS — L821 Other seborrheic keratosis: Secondary | ICD-10-CM | POA: Diagnosis not present

## 2017-06-27 DIAGNOSIS — L918 Other hypertrophic disorders of the skin: Secondary | ICD-10-CM | POA: Diagnosis not present

## 2017-06-27 DIAGNOSIS — C44529 Squamous cell carcinoma of skin of other part of trunk: Secondary | ICD-10-CM | POA: Diagnosis not present

## 2017-06-28 ENCOUNTER — Ambulatory Visit: Payer: 59 | Admitting: Cardiology

## 2017-08-14 MED FILL — ESCITALOPRAM 10 MG TABLET: 10 | 90 days supply | Qty: 90 | Fill #1

## 2017-08-14 MED FILL — SYNTHROID 175 MCG TABLET: 175 | 90 days supply | Qty: 90 | Fill #1

## 2017-09-07 MED FILL — PHENTERMINE 37.5 MG TABLET: 37.5 | 30 days supply | Qty: 30 | Fill #0

## 2017-09-08 MED FILL — ESOMEPRAZOLE MAG DR 40 MG C: 40 | 90 days supply | Qty: 180 | Fill #1

## 2017-09-19 ENCOUNTER — Other Ambulatory Visit: Payer: Self-pay | Admitting: Specialist

## 2017-09-19 DIAGNOSIS — Z1231 Encounter for screening mammogram for malignant neoplasm of breast: Secondary | ICD-10-CM

## 2017-10-17 ENCOUNTER — Ambulatory Visit
Admission: RE | Admit: 2017-10-17 | Discharge: 2017-10-17 | Disposition: A | Discharge status: Home / Self Care | Payer: 59 | Source: Ambulatory Visit | Attending: Specialist | Admitting: Specialist

## 2017-10-17 ENCOUNTER — Ambulatory Visit: Payer: 59

## 2017-10-17 DIAGNOSIS — R5383 Other fatigue: Secondary | ICD-10-CM | POA: Diagnosis not present

## 2017-10-17 DIAGNOSIS — E782 Mixed hyperlipidemia: Secondary | ICD-10-CM | POA: Diagnosis not present

## 2017-10-17 DIAGNOSIS — K219 Gastro-esophageal reflux disease without esophagitis: Secondary | ICD-10-CM | POA: Diagnosis not present

## 2017-10-17 DIAGNOSIS — R928 Other abnormal and inconclusive findings on diagnostic imaging of breast: Secondary | ICD-10-CM | POA: Diagnosis not present

## 2017-10-17 DIAGNOSIS — Z1231 Encounter for screening mammogram for malignant neoplasm of breast: Secondary | ICD-10-CM

## 2017-10-17 DIAGNOSIS — Z0000 Encounter for general adult medical examination without abnormal findings: Secondary | ICD-10-CM | POA: Diagnosis not present

## 2017-10-17 DIAGNOSIS — F330 Major depressive disorder, recurrent, mild: Secondary | ICD-10-CM | POA: Diagnosis not present

## 2017-10-17 DIAGNOSIS — E559 Vitamin D deficiency, unspecified: Secondary | ICD-10-CM | POA: Diagnosis not present

## 2017-10-17 DIAGNOSIS — G4733 Obstructive sleep apnea (adult) (pediatric): Secondary | ICD-10-CM | POA: Diagnosis not present

## 2017-10-17 DIAGNOSIS — R5381 Other malaise: Secondary | ICD-10-CM | POA: Diagnosis not present

## 2017-10-17 DIAGNOSIS — E039 Hypothyroidism, unspecified: Secondary | ICD-10-CM | POA: Diagnosis not present

## 2017-11-14 ENCOUNTER — Ambulatory Visit: Payer: 59

## 2017-11-15 MED FILL — SYNTHROID 175 MCG TABLET: 175 | 90 days supply | Qty: 90 | Fill #0

## 2017-11-15 MED FILL — ESCITALOPRAM 10 MG TABLET: 10 | 90 days supply | Qty: 90 | Fill #0

## 2017-11-16 DIAGNOSIS — I517 Cardiomegaly: Secondary | ICD-10-CM | POA: Diagnosis not present

## 2017-12-05 MED FILL — BELVIQ 10 MG TABLET: 10 | 30 days supply | Qty: 60 | Fill #0

## 2017-12-27 MED FILL — ESOMEPRAZOLE MAG DR 40 MG C: 40 | 90 days supply | Qty: 180 | Fill #0

## 2018-02-19 MED FILL — SYNTHROID 175 MCG TABLET: 175 | 90 days supply | Qty: 90 | Fill #1

## 2018-02-19 MED FILL — ESCITALOPRAM 10 MG TABLET: 10 | 90 days supply | Qty: 90 | Fill #1

## 2018-03-16 DIAGNOSIS — R0602 Shortness of breath: Secondary | ICD-10-CM | POA: Diagnosis not present

## 2018-03-16 DIAGNOSIS — E039 Hypothyroidism, unspecified: Secondary | ICD-10-CM | POA: Diagnosis not present

## 2018-03-16 DIAGNOSIS — R5381 Other malaise: Secondary | ICD-10-CM | POA: Diagnosis not present

## 2018-03-16 DIAGNOSIS — R5383 Other fatigue: Secondary | ICD-10-CM | POA: Diagnosis not present

## 2018-03-16 DIAGNOSIS — R232 Flushing: Secondary | ICD-10-CM | POA: Diagnosis not present

## 2018-03-16 DIAGNOSIS — E559 Vitamin D deficiency, unspecified: Secondary | ICD-10-CM | POA: Diagnosis not present

## 2018-04-02 MED FILL — ESOMEPRAZOLE MAG DR 40 MG C: 40 | 90 days supply | Qty: 180 | Fill #1

## 2018-05-30 DIAGNOSIS — E669 Obesity, unspecified: Secondary | ICD-10-CM | POA: Diagnosis not present

## 2018-05-30 DIAGNOSIS — R5383 Other fatigue: Secondary | ICD-10-CM | POA: Diagnosis not present

## 2018-05-30 DIAGNOSIS — M2550 Pain in unspecified joint: Secondary | ICD-10-CM | POA: Diagnosis not present

## 2018-05-30 DIAGNOSIS — R5381 Other malaise: Secondary | ICD-10-CM | POA: Diagnosis not present

## 2018-05-30 MED FILL — PHENTERMINE 37.5 MG TABLET: 37.5 | 90 days supply | Qty: 90 | Fill #0

## 2018-06-06 MED FILL — SYNTHROID 175 MCG TABLET: 175 | 90 days supply | Qty: 90 | Fill #0

## 2018-06-26 DIAGNOSIS — H5213 Myopia, bilateral: Secondary | ICD-10-CM | POA: Diagnosis not present

## 2018-06-26 DIAGNOSIS — H52223 Regular astigmatism, bilateral: Secondary | ICD-10-CM | POA: Diagnosis not present

## 2018-06-26 DIAGNOSIS — H524 Presbyopia: Secondary | ICD-10-CM | POA: Diagnosis not present

## 2018-07-02 ENCOUNTER — Other Ambulatory Visit: Payer: Self-pay | Admitting: Family Medicine

## 2018-07-02 ENCOUNTER — Other Ambulatory Visit (HOSPITAL_COMMUNITY): Payer: Self-pay | Admitting: Family Medicine

## 2018-07-02 DIAGNOSIS — K5732 Diverticulitis of large intestine without perforation or abscess without bleeding: Secondary | ICD-10-CM | POA: Diagnosis not present

## 2018-07-02 DIAGNOSIS — K5792 Diverticulitis of intestine, part unspecified, without perforation or abscess without bleeding: Secondary | ICD-10-CM

## 2018-07-03 DIAGNOSIS — R1032 Left lower quadrant pain: Secondary | ICD-10-CM | POA: Diagnosis not present

## 2018-07-03 DIAGNOSIS — B9689 Other specified bacterial agents as the cause of diseases classified elsewhere: Secondary | ICD-10-CM | POA: Diagnosis not present

## 2018-07-03 DIAGNOSIS — F17210 Nicotine dependence, cigarettes, uncomplicated: Secondary | ICD-10-CM | POA: Diagnosis not present

## 2018-07-03 DIAGNOSIS — N3000 Acute cystitis without hematuria: Secondary | ICD-10-CM | POA: Diagnosis not present

## 2018-07-06 ENCOUNTER — Ambulatory Visit (HOSPITAL_COMMUNITY): Payer: 59

## 2018-07-16 MED FILL — ESOMEPRAZOLE MAG DR 40 MG C: 40 | 90 days supply | Qty: 180 | Fill #0

## 2018-10-03 DIAGNOSIS — E559 Vitamin D deficiency, unspecified: Secondary | ICD-10-CM | POA: Diagnosis not present

## 2018-10-03 DIAGNOSIS — G4733 Obstructive sleep apnea (adult) (pediatric): Secondary | ICD-10-CM | POA: Diagnosis not present

## 2018-10-03 DIAGNOSIS — K219 Gastro-esophageal reflux disease without esophagitis: Secondary | ICD-10-CM | POA: Diagnosis not present

## 2018-10-03 DIAGNOSIS — R5383 Other fatigue: Secondary | ICD-10-CM | POA: Diagnosis not present

## 2018-10-03 DIAGNOSIS — R232 Flushing: Secondary | ICD-10-CM | POA: Diagnosis not present

## 2018-10-03 DIAGNOSIS — E039 Hypothyroidism, unspecified: Secondary | ICD-10-CM | POA: Diagnosis not present

## 2018-10-03 DIAGNOSIS — R748 Abnormal levels of other serum enzymes: Secondary | ICD-10-CM | POA: Diagnosis not present

## 2018-10-03 DIAGNOSIS — R5381 Other malaise: Secondary | ICD-10-CM | POA: Diagnosis not present

## 2018-10-03 DIAGNOSIS — Z0184 Encounter for antibody response examination: Secondary | ICD-10-CM | POA: Diagnosis not present

## 2018-10-03 DIAGNOSIS — E782 Mixed hyperlipidemia: Secondary | ICD-10-CM | POA: Diagnosis not present

## 2018-10-03 DIAGNOSIS — F330 Major depressive disorder, recurrent, mild: Secondary | ICD-10-CM | POA: Diagnosis not present

## 2018-10-08 MED FILL — CYCLOBENZAPRINE HCL 10 MG T: 10 | 30 days supply | Qty: 90 | Fill #0

## 2018-10-08 MED FILL — ESOMEPRAZOLE MAG DR 40 MG C: 40 | 90 days supply | Qty: 180 | Fill #0

## 2018-10-08 MED FILL — SYNTHROID 175 MCG TABLET: 175 | 90 days supply | Qty: 90 | Fill #0

## 2018-10-25 DIAGNOSIS — N3091 Cystitis, unspecified with hematuria: Secondary | ICD-10-CM | POA: Diagnosis not present

## 2018-10-25 DIAGNOSIS — R399 Unspecified symptoms and signs involving the genitourinary system: Secondary | ICD-10-CM | POA: Diagnosis not present

## 2018-10-25 MED FILL — OXYBUTYNIN CL ER 5 MG TAB: 5 | 5 days supply | Qty: 5 | Fill #0

## 2018-10-31 DIAGNOSIS — C50919 Malignant neoplasm of unspecified site of unspecified female breast: Secondary | ICD-10-CM

## 2018-10-31 HISTORY — DX: Malignant neoplasm of unspecified site of unspecified female breast: C50.919

## 2018-11-07 ENCOUNTER — Other Ambulatory Visit: Payer: Self-pay | Admitting: Specialist

## 2018-11-07 DIAGNOSIS — Z1231 Encounter for screening mammogram for malignant neoplasm of breast: Secondary | ICD-10-CM

## 2018-11-26 ENCOUNTER — Other Ambulatory Visit: Payer: Self-pay | Admitting: Family Medicine

## 2018-11-26 ENCOUNTER — Other Ambulatory Visit: Payer: Self-pay

## 2018-11-26 ENCOUNTER — Ambulatory Visit
Admission: RE | Admit: 2018-11-26 | Discharge: 2018-11-26 | Disposition: A | Discharge status: Home / Self Care | Payer: 59 | Source: Ambulatory Visit | Attending: Family Medicine | Admitting: Family Medicine

## 2018-11-26 ENCOUNTER — Other Ambulatory Visit: Payer: Self-pay | Admitting: Specialist

## 2018-11-26 DIAGNOSIS — N6310 Unspecified lump in the right breast, unspecified quadrant: Secondary | ICD-10-CM

## 2018-11-26 DIAGNOSIS — R922 Inconclusive mammogram: Secondary | ICD-10-CM | POA: Diagnosis not present

## 2018-11-26 DIAGNOSIS — N6313 Unspecified lump in the right breast, lower outer quadrant: Secondary | ICD-10-CM | POA: Diagnosis not present

## 2018-11-27 ENCOUNTER — Ambulatory Visit
Admission: RE | Admit: 2018-11-27 | Discharge: 2018-11-27 | Disposition: A | Discharge status: Home / Self Care | Payer: 59 | Source: Ambulatory Visit | Attending: Family Medicine | Admitting: Family Medicine

## 2018-11-27 DIAGNOSIS — N6310 Unspecified lump in the right breast, unspecified quadrant: Secondary | ICD-10-CM

## 2018-11-27 DIAGNOSIS — N6313 Unspecified lump in the right breast, lower outer quadrant: Secondary | ICD-10-CM | POA: Diagnosis not present

## 2018-11-27 DIAGNOSIS — Z170 Estrogen receptor positive status [ER+]: Secondary | ICD-10-CM | POA: Diagnosis not present

## 2018-11-27 DIAGNOSIS — C50511 Malignant neoplasm of lower-outer quadrant of right female breast: Secondary | ICD-10-CM | POA: Insufficient documentation

## 2018-12-03 ENCOUNTER — Ambulatory Visit: Payer: Self-pay | Admitting: Surgery

## 2018-12-03 DIAGNOSIS — C50911 Malignant neoplasm of unspecified site of right female breast: Secondary | ICD-10-CM | POA: Diagnosis not present

## 2018-12-03 DIAGNOSIS — C50511 Malignant neoplasm of lower-outer quadrant of right female breast: Secondary | ICD-10-CM | POA: Diagnosis not present

## 2018-12-03 DIAGNOSIS — Z170 Estrogen receptor positive status [ER+]: Secondary | ICD-10-CM | POA: Diagnosis not present

## 2018-12-03 NOTE — H&P (Signed)
Natalie Burton Documented: 12/03/2018 2:09 PM Location: [facility] Patient #: [id number] DOB: 12-16-1965 Married / Language: English / Race: White Female  History of Present Illness Marcello Moores A. Jalaysia Lobb MD; 12/03/2018 3:47 PM) Patient words: 53 year old female sent at the request of the [facility] [place] and Dr. Blanch Media for right breast cancer. Patient noted a mass 1 week ago in the right breast just below the nipple. Core biopsy with ultrasound guidance showed a 3.2 cm mass as she receptor positive progesterone receptor positive HER-2/neu negative grade 3. Patient complains of some mild bruising at the biopsy site. Mother had atypical breast condition necessitating bilateral mastectomy in her 8s. 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 HER2 FISH was performed by a technologist and cell imaging and analysis on the BioView. RATIO OF HER2/CEN17 SIGNALS 1.18 AVERAGE HER2 COPY NUMBER PER CELL 1.95 The ratio of HER2/CEN 17 is within the range < 2.0 of HER2/CEN 17 and a copy number of HER2 signals per cell is <4.0. Arch Pathol Lab Med 1:1,2018 Enid Cutter MD Pathologist, Electronic Signature ( Signed 11/30/2018) PROGNOSTIC INDICATORS Results: IMMUNOHISTOCHEMICAL AND MORPHOMETRIC ANALYSIS PERFORMED MANUALLY The tumor cells are EQUIVOCAL for Her2 (2+). HER2 by FISH will be PERFORMED and the RESULTS REPORTED SEPARATELY Estrogen Receptor: 80%, POSITIVE, STRONG STAINING INTENSITY Progesterone Receptor: 5%, POSITIVE, STRONG STAINING INTENSITY Proliferation Marker Ki67: 40% REFERENCE RANGE ESTROGEN RECEPTOR NEGATIVE 0% POSITIVE =>1% 1 of 3 FINAL for Natalie Burton, Natalie Burton [phone]) ADDITIONAL  INFORMATION:(continued) REFERENCE RANGE PROGESTERONE RECEPTOR NEGATIVE 0% POSITIVE =>1% All controls stained appropriately Vicente Males MD Pathologist, Electronic Signature ( Signed 11/29/2018) FINAL DIAGNOSIS Diagnosis Breast, right, needle core biopsy, 7 o'clock - INVASIVE DUCTAL CARCINOMA - DUCTAL CARCINOMA IN SITU - SEE COMMENT Microscopic Comment Based on the biopsy, the carcinoma appears Nottingham grade 3 of 3 and measures 1.8 cm in greatest linear extent. 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 Prognostic markers (ER/PR/ki-67/HER2) are pending and will be reported in an addendum. Dr. Jeannie Done has reviewed the case and agrees with above diagnosis. These results were called to [facility] on November 28, 2018. Thressa Sheller MD Pathologist, Electronic Signature (Case signed 11/28/2018)                CLINICAL DATA: Mass felt by the patient in the slightly lower outer right breast for the past 2 days. Mother diagnosed with breast cancer at age 59. Status right breast excision in 2014.  EXAM: DIGITAL DIAGNOSTIC BILATERAL MAMMOGRAM WITH CAD AND TOMO  ULTRASOUND RIGHT BREAST  COMPARISON: Previous exam(s).  ACR Breast Density Category c: The breast tissue is heterogeneously dense, which may obscure small masses.  FINDINGS: There is an interval irregular mass with pleomorphic microcalcifications in the inferior right breast slightly laterally, corresponding to the mass felt by the patient, marked with a metallic marker. There are no findings elsewhere in either breast suspicious for malignancy.  Mammographic images were processed with CAD.  On physical exam, the patient has an approximately 3 x 3 cm oval, firm, immobile, palpable mass centered in the 7 o'clock position of the right breast, 2 cm from the nipple. There are no palpable right axillary lymph nodes.  Targeted ultrasound is performed, showing a 3.2 x 3.2 x 1.7  cm irregular, heterogeneous,  predominantly hypoechoic mass with posterior acoustical shadowing in the 7 o'clock position of the right breast, 2 cm from the nipple. There is an ill-defined surrounding echogenic halo and the mass contains multiple pleomorphic calcifications.  Ultrasound of the right axilla demonstrated normal appearing right axillary lymph nodes.  IMPRESSION: 3.2 cm mass in the 7 o'clock position of the right breast with imaging features highly suspicious for malignancy.  RECOMMENDATION: Ultrasound-guided core needle biopsy of the 3.2 cm mass in the 7 o'clock position of the right breast. This has been discussed with the patient and scheduled at 8:30 a.m. on 11/27/2018.  I have discussed the findings and recommendations with the patient. Results were also provided in writing at the conclusion of the visit. If applicable, a reminder letter will be sent to the patient regarding the next appointment.  BI-RADS CATEGORY 5: Highly suggestive of malignancy.   Electronically Signed By: Claudie Revering M.D. On: 11/26/2018 16:06.  The patient is a 53 year old female.   Past Surgical History (Tanisha A. Owens Shark, [place]; 12/03/2018 2:09 PM) Breast Biopsy Right. multiple Colon Polyp Removal - Colonoscopy Gallbladder Surgery - Laparoscopic Hysterectomy (not due to cancer) - Partial Oral Surgery  Diagnostic Studies History (Tanisha A. Owens Shark, [place]; 12/03/2018 2:09 PM) Colonoscopy 1-5 years ago Mammogram within last year Pap Smear >5 years ago  Allergies (Tanisha A. Owens Shark, [place]; 12/03/2018 2:10 PM) No Known Drug Allergies [12/03/2018]: Allergies Reconciled  Medication History (Tanisha A. Owens Shark, [place]; 12/03/2018 2:10 PM) Escitalopram Oxalate (10MG Tablet, Oral) Active. Esomeprazole Magnesium (40MG Capsule DR, Oral) Active. Rizatriptan Benzoate (5MG Tablet, Oral) Active. Synthroid (175MCG Tablet, Oral) Active. Medications Reconciled  Social History (Tanisha A.  Owens Shark, [place]; 12/03/2018 2:09 PM) Alcohol use Moderate alcohol use. Caffeine use Carbonated beverages, Tea. No drug use Tobacco use Current some day smoker.  Family History (Tanisha A. Owens Shark, [place]; 12/03/2018 2:09 PM) Colon Cancer Mother. Colon Polyps Mother. Depression Mother. Diabetes Mellitus Mother. Heart Disease Father. Hypertension Mother. Ischemic Bowel Disease Sister. Respiratory Condition Father. Thyroid problems Daughter.  Pregnancy / Birth History (Tanisha A. Owens Shark, [place]; 12/03/2018 2:09 PM) Age at menarche 3 years. Contraceptive History Depo-provera. Gravida 2 Irregular periods Length (months) of breastfeeding 3-6 Maternal age 73-25 Para 2  Other Problems (Tanisha A. Owens Shark, [place]; 12/03/2018 2:09 PM) Cholelithiasis Depression Gastroesophageal Reflux Disease Lump In Breast Migraine Headache Sleep Apnea Thyroid Disease     Review of Systems (Broox Lonigro A. Marylen Zuk MD; 12/03/2018 2:27 PM) General Not Present- Appetite Loss, Chills, Fatigue, Fever, Night Sweats, Weight Gain and Weight Loss. Skin Not Present- Change in Wart/Mole, Dryness, Hives, Jaundice, New Lesions, Non-Healing Wounds, Rash and Ulcer. HEENT Not Present- Earache, Hearing Loss, Hoarseness, Nose Bleed, Oral Ulcers, Ringing in the Ears, Seasonal Allergies, Sinus Pain, Sore Throat, Visual Disturbances, Wears glasses/contact lenses and Yellow Eyes. Respiratory Not Present- Bloody sputum, Chronic Cough, Difficulty Breathing, Snoring and Wheezing. Breast Present- Breast Mass and Breast Pain. Not Present- Nipple Discharge and Skin Changes. Cardiovascular Not Present- Chest Pain, Difficulty Breathing Lying Down, Leg Cramps, Palpitations, Rapid Heart Rate, Shortness of Breath and Swelling of Extremities. Gastrointestinal Not Present- Abdominal Pain, Bloating, Bloody Stool, Change in Bowel Habits, Chronic diarrhea, Constipation, Difficulty Swallowing, Excessive gas, Gets full quickly at meals,  Hemorrhoids, Indigestion, Nausea, Rectal Pain and Vomiting. Female Genitourinary Not Present- Frequency, Nocturia, Painful Urination, Pelvic Pain and Urgency. Musculoskeletal Not Present- Back Pain, Joint Pain, Joint Stiffness, Muscle Pain, Muscle Weakness and Swelling of Extremities. Neurological Not Present- Decreased Memory, Fainting, Headaches, Numbness, Seizures, Tingling, Tremor, Trouble walking and  Weakness. Psychiatric Not Present- Anxiety, Bipolar, Change in Sleep Pattern, Depression, Fearful and Frequent crying. Endocrine Not Present- Cold Intolerance, Excessive Hunger, Hair Changes, Heat Intolerance, Hot flashes and New Diabetes. Hematology Not Present- Blood Thinners, Easy Bruising, Excessive bleeding, Gland problems, HIV and Persistent Infections. All other systems negative  Vitals (Tanisha A. Brown RMA; 12/03/2018 2:10 PM) 12/03/2018 2:09 PM Weight: 272.8 lb Height: 71in Body Surface Area: 2.41 m Body Mass Index: 38.05 kg/m  Temp.: 97.25F  Pulse: 77 (Regular)  BP: 126/84 (Sitting, Left Arm, Standard)        Physical Exam (Aviyah Swetz A. Donevan Biller MD; 12/03/2018 2:27 PM)  General Mental Status-Alert. General Appearance-Consistent with stated age. Hydration-Well hydrated. Voice-Normal.  Head and Neck Head-normocephalic, atraumatic with no lesions or palpable masses. Trachea-midline. Thyroid Gland Characteristics - normal size and consistency.  Chest and Lung Exam Chest and lung exam reveals -quiet, even and easy respiratory effort with no use of accessory muscles and on auscultation, normal breath sounds, no adventitious sounds and normal vocal resonance. Inspection Chest Wall - Normal. Back - normal.  Breast Note: Right breast shows bruising at about 6:00 with a 3 cm mobile mass. Left breast is normal. Scar is noted on both breasts.  Neurologic Neurologic evaluation reveals -alert and oriented x 3 with no impairment of recent or remote  memory. Mental Status-Normal.  Musculoskeletal Normal Exam - Left-Upper Extremity Strength Normal and Lower Extremity Strength Normal. Normal Exam - Right-Upper Extremity Strength Normal and Lower Extremity Strength Normal.  Lymphatic Head & Neck  General Head & Neck Lymphatics: Bilateral - Description - Normal. Axillary  General Axillary Region: Bilateral - Description - Normal. Tenderness - Non Tender.    Assessment & Plan (Leonidas Boateng A. Catalino Plascencia MD; 12/03/2018 3:48 PM)  BREAST CANCER, RIGHT (C50.911) Impression: Patient opted for bilateral simple mastectomy and right sentinel lymph node mapping Discussed treatment options for breast cancer to include breast conservation vs mastectomy with reconstruction. Pt has decided on mastectomy. Risk include bleeding, infection, flap necrosis, pain, numbness, recurrence, hematoma, other surgery needs. Pt understands and agrees to proceed. Risk of sentinel lymph node mapping include bleeding, infection, lymphedema, shoulder pain. stiffness, dye allergy. cosmetic deformity , blood clots, death, need for more surgery. Pt agres to proceed. with reconstruction.  Refer to medical oncology in [place] [facility] Rao oncology since she lives there Refer to plastic surgery  Genetics  Current Plans You are being scheduled for surgery- Our schedulers will call you.  You should hear from our office's scheduling department within 5 working days about the location, date, and time of surgery. We try to make accommodations for patient's preferences in scheduling surgery, but sometimes the OR schedule or the surgeon's schedule prevents [place] from making those accommodations.  If you have not heard from our office [phone]) in 5 working days, call the office and ask for your surgeon's nurse.  If you have other questions about your diagnosis, plan, or surgery, call the office and ask for your surgeon's nurse.  Pt Education - [facility] Breast Cancer Information  Given - [facility] "Breast Journey" Package We discussed the staging and pathophysiology of breast cancer. We discussed all of the different options for treatment for breast cancer including surgery, chemotherapy, radiation therapy, Herceptin, and antiestrogen therapy. We discussed a sentinel lymph node biopsy as she does not appear to having lymph node involvement right now. We discussed the performance of that with injection of radioactive tracer and blue dye. We discussed that she would have an incision underneath her  axillary hairline. We discussed that there is a bout a 10-20% chance of having a positive node with a sentinel lymph node biopsy and we will await the permanent pathology to make any other first further decisions in terms of her treatment. One of these options might be to return to the operating room to perform an axillary lymph node dissection. We discussed about a 1-2% risk lifetime of chronic shoulder pain as well as lymphedema associated with a sentinel lymph node biopsy. We discussed the options for treatment of the breast cancer which included lumpectomy versus a mastectomy. We discussed the performance of the lumpectomy with a wire placement. We discussed a 10-20% chance of a positive margin requiring reexcision in the operating room. We also discussed that she may need radiation therapy or antiestrogen therapy or both if she undergoes lumpectomy. We discussed the mastectomy and the postoperative care for that as well. We discussed that there is no difference in her survival whether she undergoes lumpectomy with radiation therapy or antiestrogen therapy versus a mastectomy. There is a slight difference in the local recurrence rate being 3-5% with lumpectomy and about 1% with a mastectomy. We discussed the risks of operation including bleeding, infection, possible reoperation. She understands her further therapy will be based on what her stages at the time of her operation.  Pt Education -  flb breast cancer surgery: discussed with patient and provided information. Pt Education - [facility] Breast Biopsy HCI: discussed with patient and provided information. Pt Education - [facility] Breast Pains Education Pt Education - [facility] Mastectomy HCI  MALIGNANT NEOPLASM OF LOWER-OUTER QUADRANT OF RIGHT BREAST OF FEMALE, ESTROGEN RECEPTOR POSITIVE (C50.511)

## 2018-12-04 DIAGNOSIS — L820 Inflamed seborrheic keratosis: Secondary | ICD-10-CM | POA: Diagnosis not present

## 2018-12-04 DIAGNOSIS — L821 Other seborrheic keratosis: Secondary | ICD-10-CM | POA: Diagnosis not present

## 2018-12-04 DIAGNOSIS — D225 Melanocytic nevi of trunk: Secondary | ICD-10-CM | POA: Diagnosis not present

## 2018-12-04 DIAGNOSIS — D2239 Melanocytic nevi of other parts of face: Secondary | ICD-10-CM | POA: Diagnosis not present

## 2018-12-04 DIAGNOSIS — L814 Other melanin hyperpigmentation: Secondary | ICD-10-CM | POA: Diagnosis not present

## 2018-12-10 DIAGNOSIS — C50511 Malignant neoplasm of lower-outer quadrant of right female breast: Secondary | ICD-10-CM | POA: Diagnosis not present

## 2018-12-10 DIAGNOSIS — Z170 Estrogen receptor positive status [ER+]: Secondary | ICD-10-CM | POA: Diagnosis not present

## 2018-12-10 DIAGNOSIS — Z720 Tobacco use: Secondary | ICD-10-CM | POA: Diagnosis not present

## 2018-12-17 DIAGNOSIS — C50511 Malignant neoplasm of lower-outer quadrant of right female breast: Secondary | ICD-10-CM | POA: Diagnosis not present

## 2018-12-19 DIAGNOSIS — C50511 Malignant neoplasm of lower-outer quadrant of right female breast: Secondary | ICD-10-CM | POA: Diagnosis not present

## 2018-12-21 ENCOUNTER — Ambulatory Visit: Payer: 59

## 2019-01-10 DIAGNOSIS — C50511 Malignant neoplasm of lower-outer quadrant of right female breast: Secondary | ICD-10-CM | POA: Diagnosis not present

## 2019-01-10 DIAGNOSIS — Z170 Estrogen receptor positive status [ER+]: Secondary | ICD-10-CM | POA: Diagnosis not present

## 2019-01-10 NOTE — H&P (Signed)
Subjective:     Patient ID: Natalie Burton is a 53 y.o. female.  HPI  Here for follow up discussion breast reconstruction prior to planned bilateral mastectomies. Presented with palpable right breast mass. MMG/US showed 3.2 x 3.2 x 1.7 cm mass in the 7 o'clock position of the right breast, 2 cmfn. Mass contains multiple pleomorphic calcifications. Right axilla benign. Biopsy demonstrated IDC, ER/PR+, Her2-  History prior right lumpectomy for ADH. Mother with colon ca and "pre cancer" breasts per patient, underwent bilateral mastectomies no reconstruction.   Current 40 B. Wt up 40 lb over last year.  Stopped all nicotine via e cig 3 weeks ago.  PMH includes OSA on CPAP. 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  Accompanied by husband who is retired Engineer, structural.  Review of Systems  Allergic/Immunologic: Positive for environmental allergies.  Psychiatric/Behavioral: Positive for sleep disturbance.  Remainder 12 point review negative    Objective:   Physical Exam  Cardiovascular: Normal rate, regular rhythm and normal heart sounds.  Pulmonary/Chest: Effort normal and breath sounds normal.  Abdominal:  Sufficient tissue reconstruction  Lymphadenopathy:    She has no axillary adenopathy.   no ptosis bilateral Right areolar scar, left breast superior pole scar from atypical mole Right breast mass palpable 6 o clock SN to nipple R 26 L 27 cm BW R 20 L 20 cm (CW 16 cm) Nipple to IMF R 8 L 8 cm    Assessment:     Right breast ca LOQ ER+ Family history breast ca Current smoker    Plan:     Plan bilateral mastectomies with immediate expander acellular dermis reconstruction.  Reviewedanticipated incisions/scars, drains, OR length, hospital stay and recovery, limitations. Discussed process of expansion and implant based risks including rupture, MRI surveillance for silicone implants, infection requiring surgery or removal, contracture.  Reviewed risks mastectomy flap necrosis requiring additional surgery.Reviewed SSM vs NSM, both will be asensate and not stimulate. Patient herself concerned about leaving breast tissue and reading online groups about not ideal position of NAC post NSM. Plan SSM.  Discussed use of acellular dermis in reconstruction, cadaveric source, incorporation over several weeks, risk that if has seroma or infection can act as additional nidus for infection if not incorporated.  Discussed prepectoral vs sub pectoral reconstruction. Discussed with patient and benefit of this is no animation deformity, may be less pain. Risk may be more visible rippling over upper poles, greater need of ADM. Reviewed pre pectoral would require larger amount acellular dermis, more drains. Discussed any type reconstruction also risks long term displacement implant and visible rippling. If prepectoral counseled I would recommend she be comfortable with silicone implants as more options that have less rippling. She agrees to prepectoral placement.  Reviewed reconstruction will be asensate and not stimulate. Reviewed additional risks including but not limited to risks mastectomy flap necrosis requiring additional surgery, seroma, hematoma, asymmetry, need to additional procedures, fat necrosis, DVT/PE, damage to adjacent structures, cardiopulmonary complications.  Discussed risk COVID infectionthrough this elective surgery. It is likely patient will receive COVID testing prior to surgery. Discussed even if patient receivesa negative test result, the tests in some cases may fail to detect the virus or patient maycontract COVID after the test.COVID 19 infectionbefore/during/aftersurgery may result in lead to a higher chance of complication and death.  Rx for [place] given.  States narcotics cause significant constipation, only used one day after last surgery due to this. Has linzess for constipation. Has flexeril at  home. Has tried Tramadol in past - did not help pain.  Irene Limbo, MD [facility] [phone], pin [phone]

## 2019-01-14 DIAGNOSIS — M8589 Other specified disorders of bone density and structure, multiple sites: Secondary | ICD-10-CM | POA: Diagnosis not present

## 2019-01-14 DIAGNOSIS — Z13820 Encounter for screening for osteoporosis: Secondary | ICD-10-CM | POA: Diagnosis not present

## 2019-01-14 DIAGNOSIS — C50511 Malignant neoplasm of lower-outer quadrant of right female breast: Secondary | ICD-10-CM | POA: Diagnosis not present

## 2019-01-14 LAB — HM DEXA SCAN

## 2019-01-17 MED FILL — ESOMEPRAZOLE MAG DR 40 MG C: 40 | 90 days supply | Qty: 180 | Fill #0

## 2019-01-17 MED FILL — SYNTHROID 175 MCG TABLET: 175 | 90 days supply | Qty: 90 | Fill #0

## 2019-01-17 NOTE — Progress Notes (Signed)
[place], [place] - [address] [facility]. [address] [place] [address] Phone: [phone] Fax: [place], [place] [address] Phone: [phone] Fax: [phone]       Your procedure is scheduled on September 22nd.  Report to [facility] Main Entrance "A" at 5:30 A.M., and check in at the Admitting office.  Call this number if you have problems the morning of surgery:  [phone]  Call [phone] if you have any questions prior to your surgery date Monday-Friday 8am-4pm    Remember:  Do not eat after midnight the night before your surgery  You may drink clear liquids until 4:30 AM the morning of your surgery.   Clear liquids allowed are: Water, Non-Citrus Juices (without pulp), Carbonated Beverages, Clear Tea, Black Coffee Only, and Gatorade  Please complete your PRE-SURGERY ENSURE that was provided to you by 4:30 AM the morning of surgery.  Please, if able, drink it in one setting. DO NOT SIP.     Take these medicines the morning of surgery with A SIP OF WATER   Escitalopram (Lexapro)  Esomeprazole (Nexium)  Levothyroxine (Synthroid)  7 days prior to surgery STOP taking any Aspirin (unless otherwise instructed by your surgeon), Aleve, Naproxen, Ibuprofen, Motrin, Advil, Goody's, BC's, all herbal medications, fish oil, and all vitamins.    The Morning of Surgery  Do not wear jewelry, make-up or nail polish.  Do not wear lotions, powders, or perfumes, or deodorant  Do not shave 48 hours prior to surgery.    Do not bring valuables to the hospital.  [facility] is not responsible for any belongings or valuables.  If you are a smoker, DO NOT Smoke 24 hours prior to surgery IF you wear a CPAP at night please bring your mask, tubing, and machine the morning of surgery   Remember that you must have someone to transport you home after your surgery, and remain  with you for 24 hours if you are discharged the same day.   Contacts, glasses, hearing aids, dentures or bridgework may not be worn into surgery.    Leave your suitcase in the car.  After surgery it may be brought to your room.  For patients admitted to the hospital, discharge time will be determined by your treatment team.  Patients discharged the day of surgery will not be allowed to drive home.    Special instructions:   Natalie Burton- Preparing For Surgery  Before surgery, you can play an important role. Because skin is not sterile, your skin needs to be as free of germs as possible. You can reduce the number of germs on your skin by washing with CHG (chlorahexidine gluconate) Soap before surgery.  CHG is an antiseptic cleaner which kills germs and bonds with the skin to continue killing germs even after washing.    Oral Hygiene is also important to reduce your risk of infection.  Remember - BRUSH YOUR TEETH THE MORNING OF SURGERY WITH YOUR REGULAR TOOTHPASTE  Please do not use if you have an allergy to CHG or antibacterial soaps. If your skin becomes reddened/irritated stop using the CHG.  Do not shave (including legs and underarms) for at least 48 hours prior to first CHG shower. It is OK to shave your face.  Please follow these instructions carefully.   1. Shower the NIGHT BEFORE SURGERY and the MORNING OF SURGERY with CHG Soap.  2. If you chose to wash your hair, wash your hair first as usual with your normal shampoo.  3. After you shampoo, rinse your hair and body thoroughly to remove the shampoo.  4. Use CHG as you would any other liquid soap. You can apply CHG directly to the skin and wash gently with a scrungie or a clean washcloth.   5. Apply the CHG Soap to your body ONLY FROM THE NECK DOWN.  Do not use on open wounds or open sores. Avoid contact with your eyes, ears, mouth and genitals (private parts). Wash Face and genitals (private parts)  with your normal soap.    6. Wash thoroughly, paying special attention to the area where your surgery will be performed.  7. Thoroughly rinse your body with warm water from the neck down.  8. DO NOT shower/wash with your normal soap after using and rinsing off the CHG Soap.  9. Pat yourself dry with a CLEAN TOWEL.  10. Wear CLEAN PAJAMAS to bed the night before surgery, wear comfortable clothes the morning of surgery  11. Place CLEAN SHEETS on your bed the night of your first shower and DO NOT SLEEP WITH PETS.    Day of Surgery:  Do not apply any deodorants/lotions. Please shower the morning of surgery with the CHG soap  Please wear clean clothes to the hospital/surgery center.   Remember to brush your teeth WITH YOUR REGULAR TOOTHPASTE.   Please read over the following fact sheets that you were given.

## 2019-01-18 ENCOUNTER — Encounter (HOSPITAL_COMMUNITY): Payer: Self-pay

## 2019-01-18 ENCOUNTER — Other Ambulatory Visit: Payer: Self-pay

## 2019-01-18 ENCOUNTER — Encounter (HOSPITAL_COMMUNITY)
Admission: RE | Admit: 2019-01-18 | Discharge: 2019-01-18 | Disposition: A | Discharge status: Home / Self Care | Payer: 59 | Source: Ambulatory Visit | Attending: Surgery | Admitting: Surgery

## 2019-01-18 ENCOUNTER — Other Ambulatory Visit (HOSPITAL_COMMUNITY)
Admission: RE | Admit: 2019-01-18 | Discharge: 2019-01-18 | Disposition: A | Discharge status: Home / Self Care | Payer: 59 | Source: Ambulatory Visit | Attending: Surgery | Admitting: Surgery

## 2019-01-18 DIAGNOSIS — C50911 Malignant neoplasm of unspecified site of right female breast: Secondary | ICD-10-CM

## 2019-01-18 DIAGNOSIS — Z20828 Contact with and (suspected) exposure to other viral communicable diseases: Secondary | ICD-10-CM | POA: Insufficient documentation

## 2019-01-18 DIAGNOSIS — Z01812 Encounter for preprocedural laboratory examination: Secondary | ICD-10-CM | POA: Insufficient documentation

## 2019-01-18 HISTORY — DX: Malignant (primary) neoplasm, unspecified: C80.1

## 2019-01-18 HISTORY — DX: Headache, unspecified: R51.9

## 2019-01-18 HISTORY — DX: Irritable bowel syndrome, unspecified: K58.9

## 2019-01-18 LAB — CBC WITH DIFFERENTIAL/PLATELET
Abs Immature Granulocytes: 0.01 10*3/uL (ref 0.00–0.07)
Basophils Absolute: 0.1 10*3/uL (ref 0.0–0.1)
Basophils Relative: 1 %
Eosinophils Absolute: 0.2 10*3/uL (ref 0.0–0.5)
Eosinophils Relative: 4 %
HCT: 43.3 % (ref 36.0–46.0)
Hemoglobin: 14.2 g/dL (ref 12.0–15.0)
Immature Granulocytes: 0 %
Lymphocytes Relative: 25 %
Lymphs Abs: 1.7 10*3/uL (ref 0.7–4.0)
MCH: 30.1 pg (ref 26.0–34.0)
MCHC: 32.8 g/dL (ref 30.0–36.0)
MCV: 91.9 fL (ref 80.0–100.0)
Monocytes Absolute: 0.4 10*3/uL (ref 0.1–1.0)
Monocytes Relative: 6 %
Neutro Abs: 4.4 10*3/uL (ref 1.7–7.7)
Neutrophils Relative %: 64 %
Platelets: 245 10*3/uL (ref 150–400)
RBC: 4.71 MIL/uL (ref 3.87–5.11)
RDW: 12.5 % (ref 11.5–15.5)
WBC: 6.8 10*3/uL (ref 4.0–10.5)
nRBC: 0 % (ref 0.0–0.2)

## 2019-01-18 LAB — COMPREHENSIVE METABOLIC PANEL
ALT: 40 U/L (ref 0–44)
AST: 21 U/L (ref 15–41)
Albumin: 3.9 g/dL (ref 3.5–5.0)
Alkaline Phosphatase: 96 U/L (ref 38–126)
Anion gap: 8 (ref 5–15)
BUN: 13 mg/dL (ref 6–20)
CO2: 26 mmol/L (ref 22–32)
Calcium: 9.6 mg/dL (ref 8.9–10.3)
Chloride: 103 mmol/L (ref 98–111)
Creatinine, Ser: 0.78 mg/dL (ref 0.44–1.00)
GFR calc Af Amer: >60 mL/min (ref >60)
GFR calc non Af Amer: >60 mL/min (ref >60)
Glucose, Bld: 99 mg/dL (ref 70–99)
Potassium: 3.9 mmol/L (ref 3.5–5.1)
Sodium: 137 mmol/L (ref 135–145)
Total Bilirubin: 0.5 mg/dL (ref 0.3–1.2)
Total Protein: 7.2 g/dL (ref 6.5–8.1)

## 2019-01-18 NOTE — Progress Notes (Signed)
PCP: Edyth Gunnels, NP in [place] Cardiologist: Denies  EKG: 2019 C.E. NSR CXR: n/a ECHO: 2019 Stress Test: denies Cardiac Cath: denies  Had previous cardiac workup in 2019 for SOB, but cardiac workup was normal.  SOB caused by medications (now on allergy list)--no SOB currently  Pre-surgery ENSURE provided. Going for Covid testing today.   Patient denies shortness of breath, fever, cough, and chest pain at PAT appointment.  Patient verbalized understanding of instructions provided today at the PAT appointment.  Patient asked to review instructions at home and day of surgery.

## 2019-01-19 LAB — NOVEL CORONAVIRUS, NAA (HOSP ORDER, SEND-OUT TO REF LAB; TAT 18-24 HRS): SARS-CoV-2, NAA: NOT DETECTED

## 2019-01-21 MED ORDER — DEXTROSE 5 % IV SOLN
3.0000 g | INTRAVENOUS | Status: AC
  Administered 2019-01-22: 3 g via INTRAVENOUS
  Filled 2019-01-21: qty 3

## 2019-01-21 NOTE — Anesthesia Preprocedure Evaluation (Addendum)
Anesthesia Evaluation  Patient identified by MRN, date of birth, ID band Patient awake    Reviewed: Allergy & Precautions, NPO status , Patient's Chart, lab work & pertinent test results  History of Anesthesia Complications Negative for: history of anesthetic complications  Airway Mallampati: III  TM Distance: >3 FB Neck ROM: Full    Dental  (+) Teeth Intact, Dental Advisory Given   Pulmonary sleep apnea , former smoker,    Pulmonary exam normal breath sounds clear to auscultation       Cardiovascular negative cardio ROS Normal cardiovascular exam Rhythm:Regular Rate:Normal     Neuro/Psych  Headaches, PSYCHIATRIC DISORDERS Anxiety    GI/Hepatic Neg liver ROS, GERD  Medicated,  Endo/Other  Hypothyroidism Morbid obesity  Renal/GU negative Renal ROS     Musculoskeletal negative musculoskeletal ROS (+)   Abdominal   Peds  Hematology negative hematology ROS (+)   Anesthesia Other Findings Day of surgery medications reviewed with the patient.  Breast cancer  Reproductive/Obstetrics                            Anesthesia Physical Anesthesia Plan  ASA: III  Anesthesia Plan: General   Post-op Pain Management:  Regional for Post-op pain   Induction: Intravenous  PONV Risk Score and Plan: 3 and Diphenhydramine, Scopolamine patch - Pre-op, Midazolam, Dexamethasone and Ondansetron  Airway Management Planned: Oral ETT  Additional Equipment:   Intra-op Plan:   Post-operative Plan: Extubation in OR  Informed Consent: I have reviewed the patients History and Physical, chart, labs and discussed the procedure including the risks, benefits and alternatives for the proposed anesthesia with the patient or authorized representative who has indicated his/her understanding and acceptance.     Dental advisory given  Plan Discussed with: CRNA  Anesthesia Plan Comments:        Anesthesia  Quick Evaluation

## 2019-01-22 ENCOUNTER — Encounter (HOSPITAL_COMMUNITY)
Admission: RE | Admit: 2019-01-22 | Discharge: 2019-01-22 | Disposition: A | Discharge status: Home / Self Care | Payer: 59 | Source: Ambulatory Visit | Attending: Surgery | Admitting: Surgery

## 2019-01-22 ENCOUNTER — Ambulatory Visit (HOSPITAL_COMMUNITY): Payer: 59 | Admitting: Anesthesiology

## 2019-01-22 ENCOUNTER — Encounter (HOSPITAL_COMMUNITY)
Admission: RE | Disposition: A | Discharge status: Home / Self Care | Payer: Self-pay | Source: Home / Self Care | Attending: Plastic Surgery

## 2019-01-22 ENCOUNTER — Encounter (HOSPITAL_COMMUNITY): Payer: Self-pay

## 2019-01-22 ENCOUNTER — Other Ambulatory Visit: Payer: Self-pay

## 2019-01-22 ENCOUNTER — Observation Stay (HOSPITAL_COMMUNITY)
Admission: RE | Admit: 2019-01-22 | Discharge: 2019-01-23 | Disposition: A | Discharge status: Home / Self Care | Payer: 59 | Attending: Plastic Surgery | Admitting: Plastic Surgery

## 2019-01-22 DIAGNOSIS — Z6839 Body mass index (BMI) 39.0-39.9, adult: Secondary | ICD-10-CM | POA: Insufficient documentation

## 2019-01-22 DIAGNOSIS — G4733 Obstructive sleep apnea (adult) (pediatric): Secondary | ICD-10-CM | POA: Diagnosis not present

## 2019-01-22 DIAGNOSIS — F17200 Nicotine dependence, unspecified, uncomplicated: Secondary | ICD-10-CM | POA: Diagnosis not present

## 2019-01-22 DIAGNOSIS — G8918 Other acute postprocedural pain: Secondary | ICD-10-CM | POA: Diagnosis not present

## 2019-01-22 DIAGNOSIS — Z803 Family history of malignant neoplasm of breast: Secondary | ICD-10-CM | POA: Diagnosis not present

## 2019-01-22 DIAGNOSIS — N62 Hypertrophy of breast: Secondary | ICD-10-CM | POA: Diagnosis not present

## 2019-01-22 DIAGNOSIS — C50911 Malignant neoplasm of unspecified site of right female breast: Secondary | ICD-10-CM | POA: Diagnosis present

## 2019-01-22 DIAGNOSIS — D242 Benign neoplasm of left breast: Secondary | ICD-10-CM | POA: Diagnosis not present

## 2019-01-22 DIAGNOSIS — Z170 Estrogen receptor positive status [ER+]: Secondary | ICD-10-CM | POA: Insufficient documentation

## 2019-01-22 DIAGNOSIS — C50511 Malignant neoplasm of lower-outer quadrant of right female breast: Secondary | ICD-10-CM | POA: Diagnosis not present

## 2019-01-22 DIAGNOSIS — K219 Gastro-esophageal reflux disease without esophagitis: Secondary | ICD-10-CM | POA: Insufficient documentation

## 2019-01-22 DIAGNOSIS — N6489 Other specified disorders of breast: Secondary | ICD-10-CM | POA: Diagnosis not present

## 2019-01-22 DIAGNOSIS — G4730 Sleep apnea, unspecified: Secondary | ICD-10-CM | POA: Diagnosis not present

## 2019-01-22 HISTORY — PX: MASTECTOMY W/ SENTINEL NODE BIOPSY: SHX2001

## 2019-01-22 HISTORY — PX: BREAST RECONSTRUCTION WITH PLACEMENT OF TISSUE EXPANDER AND ALLODERM: SHX6805

## 2019-01-22 SURGERY — MASTECTOMY WITH SENTINEL LYMPH NODE BIOPSY
Anesthesia: General | Site: Breast | Laterality: Bilateral

## 2019-01-22 MED ORDER — EPHEDRINE 5 MG/ML INJ
INTRAVENOUS | Status: AC
  Filled 2019-01-22: qty 10

## 2019-01-22 MED ORDER — FENTANYL CITRATE (PF) 100 MCG/2ML IJ SOLN
INTRAMUSCULAR | Status: AC
  Filled 2019-01-22: qty 2

## 2019-01-22 MED ORDER — CHLORHEXIDINE GLUCONATE CLOTH 2 % EX PADS: 6.0000 | MEDICATED_PAD | Freq: Once | CUTANEOUS | Status: DC

## 2019-01-22 MED ORDER — ONDANSETRON HCL 4 MG/2ML IJ SOLN
INTRAMUSCULAR | Status: DC | PRN
  Administered 2019-01-22: 4 mg via INTRAVENOUS

## 2019-01-22 MED ORDER — OXYCODONE HCL 5 MG PO TABS
ORAL_TABLET | ORAL | Status: AC
  Filled 2019-01-22: qty 2

## 2019-01-22 MED ORDER — LIDOCAINE 2% (20 MG/ML) 5 ML SYRINGE
INTRAMUSCULAR | Status: AC
  Filled 2019-01-22: qty 5

## 2019-01-22 MED ORDER — ENOXAPARIN SODIUM 40 MG/0.4ML ~~LOC~~ SOLN: 40.0000 mg | SUBCUTANEOUS | Status: DC

## 2019-01-22 MED ORDER — PANTOPRAZOLE SODIUM 40 MG PO TBEC: 40.0000 mg | DELAYED_RELEASE_TABLET | Freq: Every day | ORAL | Status: DC

## 2019-01-22 MED ORDER — LEVOTHYROXINE SODIUM 75 MCG PO TABS
175.0000 ug | ORAL_TABLET | Freq: Every day | ORAL | Status: DC
  Administered 2019-01-23: 175 ug via ORAL
  Filled 2019-01-22: qty 1

## 2019-01-22 MED ORDER — SCOPOLAMINE 1 MG/3DAYS TD PT72
1.0000 | MEDICATED_PATCH | Freq: Once | TRANSDERMAL | Status: DC
  Administered 2019-01-22: 1.5 mg via TRANSDERMAL
  Filled 2019-01-22: qty 1

## 2019-01-22 MED ORDER — CYCLOBENZAPRINE HCL 10 MG PO TABS
10.0000 mg | ORAL_TABLET | Freq: Three times a day (TID) | ORAL | Status: DC | PRN
  Administered 2019-01-22: 15:00:00 10 mg via ORAL
  Filled 2019-01-22 (×3): qty 1

## 2019-01-22 MED ORDER — TECHNETIUM TC 99M SULFUR COLLOID FILTERED
1.0000 | Freq: Once | INTRAVENOUS | Status: AC | PRN
  Administered 2019-01-22: 07:00:00 1 via INTRADERMAL

## 2019-01-22 MED ORDER — MIDAZOLAM HCL 2 MG/2ML IJ SOLN
INTRAMUSCULAR | Status: AC
  Filled 2019-01-22: qty 2

## 2019-01-22 MED ORDER — LIDOCAINE 2% (20 MG/ML) 5 ML SYRINGE
INTRAMUSCULAR | Status: DC | PRN
  Administered 2019-01-22: 100 mg via INTRAVENOUS

## 2019-01-22 MED ORDER — STERILE WATER FOR IRRIGATION IR SOLN
Status: DC | PRN
  Administered 2019-01-22: 1000 mL

## 2019-01-22 MED ORDER — MIDAZOLAM HCL 5 MG/5ML IJ SOLN
INTRAMUSCULAR | Status: DC | PRN
  Administered 2019-01-22: 2 mg via INTRAVENOUS

## 2019-01-22 MED ORDER — METHYLENE BLUE 0.5 % INJ SOLN
INTRAVENOUS | Status: AC
  Filled 2019-01-22: qty 10

## 2019-01-22 MED ORDER — CYCLOBENZAPRINE HCL 10 MG PO TABS
ORAL_TABLET | ORAL | Status: AC
  Filled 2019-01-22: qty 1

## 2019-01-22 MED ORDER — SODIUM CHLORIDE 0.9 % IV SOLN
Freq: Once | INTRAVENOUS | Status: AC
  Administered 2019-01-22: 08:00:00 1000 mL
  Filled 2019-01-22 (×2): qty 1

## 2019-01-22 MED ORDER — BUPIVACAINE-EPINEPHRINE (PF) 0.25% -1:200000 IJ SOLN
INTRAMUSCULAR | Status: DC | PRN
  Administered 2019-01-22 (×2): 30 mL via PERINEURAL

## 2019-01-22 MED ORDER — GABAPENTIN 300 MG PO CAPS
300.0000 mg | ORAL_CAPSULE | ORAL | Status: AC
  Administered 2019-01-22: 300 mg via ORAL
  Filled 2019-01-22: qty 1

## 2019-01-22 MED ORDER — FENTANYL CITRATE (PF) 250 MCG/5ML IJ SOLN
INTRAMUSCULAR | Status: DC | PRN
  Administered 2019-01-22 (×5): 50 ug via INTRAVENOUS

## 2019-01-22 MED ORDER — LACTATED RINGERS IV SOLN
INTRAVENOUS | Status: DC | PRN
  Administered 2019-01-22 (×2): via INTRAVENOUS

## 2019-01-22 MED ORDER — EPHEDRINE SULFATE-NACL 50-0.9 MG/10ML-% IV SOSY
PREFILLED_SYRINGE | INTRAVENOUS | Status: DC | PRN
  Administered 2019-01-22 (×2): 5 mg via INTRAVENOUS

## 2019-01-22 MED ORDER — ROCURONIUM BROMIDE 10 MG/ML (PF) SYRINGE
PREFILLED_SYRINGE | INTRAVENOUS | Status: AC
  Filled 2019-01-22: qty 10

## 2019-01-22 MED ORDER — ACETAMINOPHEN 650 MG RE SUPP: 650.0000 mg | Freq: Four times a day (QID) | RECTAL | Status: DC | PRN

## 2019-01-22 MED ORDER — KETOROLAC TROMETHAMINE 30 MG/ML IJ SOLN
30.0000 mg | Freq: Three times a day (TID) | INTRAMUSCULAR | Status: AC
  Administered 2019-01-22 – 2019-01-23 (×3): 30 mg via INTRAVENOUS
  Filled 2019-01-22 (×2): qty 1

## 2019-01-22 MED ORDER — DEXAMETHASONE SODIUM PHOSPHATE 10 MG/ML IJ SOLN
INTRAMUSCULAR | Status: AC
  Filled 2019-01-22: qty 1

## 2019-01-22 MED ORDER — ESCITALOPRAM OXALATE 20 MG PO TABS: 20.0000 mg | ORAL_TABLET | Freq: Every day | ORAL | Status: DC

## 2019-01-22 MED ORDER — PROPOFOL 10 MG/ML IV BOLUS
INTRAVENOUS | Status: DC | PRN
  Administered 2019-01-22: 200 mg via INTRAVENOUS

## 2019-01-22 MED ORDER — SUGAMMADEX SODIUM 200 MG/2ML IV SOLN
INTRAVENOUS | Status: DC | PRN
  Administered 2019-01-22: 250 mg via INTRAVENOUS

## 2019-01-22 MED ORDER — PROPOFOL 10 MG/ML IV BOLUS
INTRAVENOUS | Status: AC
  Filled 2019-01-22: qty 20

## 2019-01-22 MED ORDER — 0.9 % SODIUM CHLORIDE (POUR BTL) OPTIME
TOPICAL | Status: DC | PRN
  Administered 2019-01-22 (×3): 1000 mL

## 2019-01-22 MED ORDER — FENTANYL CITRATE (PF) 250 MCG/5ML IJ SOLN
INTRAMUSCULAR | Status: AC
  Filled 2019-01-22: qty 5

## 2019-01-22 MED ORDER — OXYCODONE HCL 5 MG PO TABS
5.0000 mg | ORAL_TABLET | ORAL | Status: DC | PRN
  Administered 2019-01-22: 5 mg via ORAL
  Administered 2019-01-22: 10 mg via ORAL
  Filled 2019-01-22 (×2): qty 2

## 2019-01-22 MED ORDER — KETOROLAC TROMETHAMINE 30 MG/ML IJ SOLN
INTRAMUSCULAR | Status: AC
  Filled 2019-01-22: qty 1

## 2019-01-22 MED ORDER — SODIUM CHLORIDE 0.9 % IV SOLN
INTRAVENOUS | Status: DC | PRN
  Administered 2019-01-22: 25 ug/min via INTRAVENOUS

## 2019-01-22 MED ORDER — DEXAMETHASONE SODIUM PHOSPHATE 10 MG/ML IJ SOLN
INTRAMUSCULAR | Status: DC | PRN
  Administered 2019-01-22: 10 mg via INTRAVENOUS

## 2019-01-22 MED ORDER — FENTANYL CITRATE (PF) 100 MCG/2ML IJ SOLN
25.0000 ug | INTRAMUSCULAR | Status: DC | PRN
  Administered 2019-01-22 (×3): 50 ug via INTRAVENOUS

## 2019-01-22 MED ORDER — CLONIDINE HCL (ANALGESIA) 100 MCG/ML EP SOLN
EPIDURAL | Status: DC | PRN
  Administered 2019-01-22 (×2): 50 ug

## 2019-01-22 MED ORDER — ONDANSETRON HCL 4 MG/2ML IJ SOLN
INTRAMUSCULAR | Status: AC
  Filled 2019-01-22: qty 2

## 2019-01-22 MED ORDER — KCL IN DEXTROSE-NACL 20-5-0.45 MEQ/L-%-% IV SOLN
INTRAVENOUS | Status: DC
  Administered 2019-01-22 – 2019-01-23 (×2): via INTRAVENOUS
  Filled 2019-01-22 (×2): qty 1000

## 2019-01-22 MED ORDER — DIPHENHYDRAMINE HCL 50 MG/ML IJ SOLN
INTRAMUSCULAR | Status: DC | PRN
  Administered 2019-01-22: 12.5 mg via INTRAVENOUS

## 2019-01-22 MED ORDER — PROMETHAZINE HCL 25 MG/ML IJ SOLN: 6.2500 mg | INTRAMUSCULAR | Status: DC | PRN

## 2019-01-22 MED ORDER — CEFAZOLIN SODIUM-DEXTROSE 1-4 GM/50ML-% IV SOLN
1.0000 g | Freq: Three times a day (TID) | INTRAVENOUS | Status: DC
  Administered 2019-01-22 – 2019-01-23 (×2): 1 g via INTRAVENOUS
  Filled 2019-01-22 (×3): qty 50

## 2019-01-22 MED ORDER — GABAPENTIN 300 MG PO CAPS
300.0000 mg | ORAL_CAPSULE | Freq: Two times a day (BID) | ORAL | Status: DC
  Administered 2019-01-22: 300 mg via ORAL
  Filled 2019-01-22: qty 1

## 2019-01-22 MED ORDER — ACETAMINOPHEN 325 MG PO TABS: 650.0000 mg | ORAL_TABLET | Freq: Four times a day (QID) | ORAL | Status: DC | PRN

## 2019-01-22 MED ORDER — HYDROMORPHONE HCL 1 MG/ML IJ SOLN: 0.5000 mg | INTRAMUSCULAR | Status: DC | PRN

## 2019-01-22 MED ORDER — ACETAMINOPHEN 500 MG PO TABS
1000.0000 mg | ORAL_TABLET | ORAL | Status: AC
  Administered 2019-01-22: 1000 mg via ORAL
  Filled 2019-01-22: qty 2

## 2019-01-22 MED ORDER — ROCURONIUM BROMIDE 10 MG/ML (PF) SYRINGE
PREFILLED_SYRINGE | INTRAVENOUS | Status: DC | PRN
  Administered 2019-01-22: 60 mg via INTRAVENOUS

## 2019-01-22 SURGICAL SUPPLY — 88 items
ALLODERM 16X20 PERFORATED (Tissue) ×2 IMPLANT
ALLOGRAFT PERF 16X20 1.6+/-0.4 (Tissue) ×2 IMPLANT
APPLIER CLIP 9.375 MED OPEN (MISCELLANEOUS) ×3
BAG DECANTER FOR FLEXI CONT (MISCELLANEOUS) ×3 IMPLANT
BINDER BREAST LRG (GAUZE/BANDAGES/DRESSINGS) IMPLANT
BINDER BREAST XLRG (GAUZE/BANDAGES/DRESSINGS) ×2 IMPLANT
CANISTER SUCT 3000ML PPV (MISCELLANEOUS) ×6 IMPLANT
CHLORAPREP W/TINT 26 (MISCELLANEOUS) ×6 IMPLANT
CLIP APPLIE 9.375 MED OPEN (MISCELLANEOUS) ×1 IMPLANT
CONT SPEC 4OZ CLIKSEAL STRL BL (MISCELLANEOUS) ×3 IMPLANT
COVER PROBE W GEL 5X96 (DRAPES) ×3 IMPLANT
COVER SURGICAL LIGHT HANDLE (MISCELLANEOUS) ×6 IMPLANT
COVER WAND RF STERILE (DRAPES) ×6 IMPLANT
DERMABOND ADVANCED (GAUZE/BANDAGES/DRESSINGS) ×6
DERMABOND ADVANCED .7 DNX12 (GAUZE/BANDAGES/DRESSINGS) ×3 IMPLANT
DRAIN CHANNEL 15F RND FF W/TCR (WOUND CARE) IMPLANT
DRAIN CHANNEL 19F RND (DRAIN) ×1 IMPLANT
DRAPE HALF SHEET 40X57 (DRAPES) IMPLANT
DRAPE LAPAROSCOPIC ABDOMINAL (DRAPES) ×3 IMPLANT
DRAPE ORTHO SPLIT 77X108 STRL (DRAPES) ×4
DRAPE SURG ORHT 6 SPLT 77X108 (DRAPES) ×2 IMPLANT
DRAPE WARM FLUID 44X44 (DRAPES) ×3 IMPLANT
DRSG PAD ABDOMINAL 8X10 ST (GAUZE/BANDAGES/DRESSINGS) ×8 IMPLANT
DRSG TEGADERM 4X4.75 (GAUZE/BANDAGES/DRESSINGS) ×8 IMPLANT
ELECT BLADE 4.0 EZ CLEAN MEGAD (MISCELLANEOUS) ×3
ELECT CAUTERY BLADE 6.4 (BLADE) ×3 IMPLANT
ELECT COATED BLADE 2.86 ST (ELECTRODE) ×3 IMPLANT
ELECT REM PT RETURN 9FT ADLT (ELECTROSURGICAL) ×6
ELECTRODE BLDE 4.0 EZ CLN MEGD (MISCELLANEOUS) ×1 IMPLANT
ELECTRODE REM PT RTRN 9FT ADLT (ELECTROSURGICAL) ×2 IMPLANT
EVACUATOR SILICONE 100CC (DRAIN) ×9 IMPLANT
EXPANDER TISSUE FV FOURTE 400 (Prosthesis & Implant Plastic) IMPLANT
GLOVE BIO SURGEON STRL SZ 6 (GLOVE) ×11 IMPLANT
GLOVE BIO SURGEON STRL SZ8 (GLOVE) ×7 IMPLANT
GLOVE BIOGEL PI IND STRL 6.5 (GLOVE) IMPLANT
GLOVE BIOGEL PI IND STRL 8 (GLOVE) ×1 IMPLANT
GLOVE BIOGEL PI INDICATOR 6.5 (GLOVE) ×10
GLOVE BIOGEL PI INDICATOR 8 (GLOVE) ×4
GLOVE ECLIPSE 6.5 STRL STRAW (GLOVE) ×10 IMPLANT
GOWN STRL REUS W/ TWL LRG LVL3 (GOWN DISPOSABLE) ×5 IMPLANT
GOWN STRL REUS W/ TWL XL LVL3 (GOWN DISPOSABLE) ×1 IMPLANT
GOWN STRL REUS W/TWL LRG LVL3 (GOWN DISPOSABLE) ×12
GOWN STRL REUS W/TWL XL LVL3 (GOWN DISPOSABLE) ×4
KIT BASIN OR (CUSTOM PROCEDURE TRAY) ×6 IMPLANT
KIT FILL SYSTEM UNIVERSAL (SET/KITS/TRAYS/PACK) ×2 IMPLANT
KIT TURNOVER KIT B (KITS) ×6 IMPLANT
MARKER SKIN DUAL TIP RULER LAB (MISCELLANEOUS) ×3 IMPLANT
NDL 18GX1X1/2 (RX/OR ONLY) (NEEDLE) ×1 IMPLANT
NDL FILTER BLUNT 18X1 1/2 (NEEDLE) IMPLANT
NDL HYPO 25GX1X1/2 BEV (NEEDLE) ×1 IMPLANT
NEEDLE 18GX1X1/2 (RX/OR ONLY) (NEEDLE) ×3 IMPLANT
NEEDLE FILTER BLUNT 18X 1/2SAF (NEEDLE)
NEEDLE FILTER BLUNT 18X1 1/2 (NEEDLE) IMPLANT
NEEDLE HYPO 25GX1X1/2 BEV (NEEDLE) ×12 IMPLANT
NS IRRIG 1000ML POUR BTL (IV SOLUTION) ×9 IMPLANT
PACK GENERAL/GYN (CUSTOM PROCEDURE TRAY) ×4 IMPLANT
PAD ABD 8X10 STRL (GAUZE/BANDAGES/DRESSINGS) ×4 IMPLANT
PAD ARMBOARD 7.5X6 YLW CONV (MISCELLANEOUS) ×6 IMPLANT
PENCIL SMOKE EVACUATOR (MISCELLANEOUS) ×3 IMPLANT
PIN SAFETY STERILE (MISCELLANEOUS) ×3 IMPLANT
PUNCH BIOPSY 4MM (MISCELLANEOUS)
PUNCH BIOPSY DERMAL 6MM STRL (MISCELLANEOUS) IMPLANT
PUNCH BIOPSY DISP 4 (MISCELLANEOUS) IMPLANT
SET ASEPTIC TRANSFER (MISCELLANEOUS) ×3 IMPLANT
SOL PREP POV-IOD 4OZ 10% (MISCELLANEOUS) ×3 IMPLANT
SPECIMEN JAR MEDIUM (MISCELLANEOUS) ×4 IMPLANT
SPECIMEN JAR X LARGE (MISCELLANEOUS) ×3 IMPLANT
SPONGE LAP 18X18 RF (DISPOSABLE) ×6 IMPLANT
STAPLER VISISTAT 35W (STAPLE) ×3 IMPLANT
SUT CHROMIC 4 0 PS 2 18 (SUTURE) ×12 IMPLANT
SUT ETHILON 2 0 FS 18 (SUTURE) ×4 IMPLANT
SUT ETHILON 3 0 FSL (SUTURE) ×3 IMPLANT
SUT MNCRL AB 4-0 PS2 18 (SUTURE) ×7 IMPLANT
SUT VIC AB 0 CT2 27 (SUTURE) ×8 IMPLANT
SUT VIC AB 3-0 SH 18 (SUTURE) ×3 IMPLANT
SUT VIC AB 3-0 SH 27 (SUTURE) ×4
SUT VIC AB 3-0 SH 27X BRD (SUTURE) IMPLANT
SUT VIC AB 4-0 PS2 18 (SUTURE) ×4 IMPLANT
SUT VICRYL 4-0 PS2 18IN ABS (SUTURE) IMPLANT
SUT VLOC 180 0 24IN GS25 (SUTURE) ×4 IMPLANT
SYR BULB IRRIGATION 50ML (SYRINGE) ×5 IMPLANT
SYR CONTROL 10ML LL (SYRINGE) ×3 IMPLANT
TISSUE EXPNDR FV FOURTE 400 (Prosthesis & Implant Plastic) ×6 IMPLANT
TOWEL GREEN STERILE (TOWEL DISPOSABLE) ×4 IMPLANT
TOWEL GREEN STERILE FF (TOWEL DISPOSABLE) ×6 IMPLANT
TRAY FOLEY MTR SLVR 16FR STAT (SET/KITS/TRAYS/PACK) ×2 IMPLANT
TUBE CONNECTING 12'X1/4 (SUCTIONS) ×1
TUBE CONNECTING 12X1/4 (SUCTIONS) ×2 IMPLANT

## 2019-01-22 NOTE — Anesthesia Procedure Notes (Signed)
Procedure Name: Intubation Date/Time: 01/22/2019 7:34 AM Performed by: Gaylene Brooks, CRNA Pre-anesthesia Checklist: Patient identified, Emergency Drugs available, Suction available and Patient being monitored Patient Re-evaluated:Patient Re-evaluated prior to induction Oxygen Delivery Method: Circle System Utilized Preoxygenation: Pre-oxygenation with 100% oxygen Induction Type: IV induction Ventilation: Mask ventilation without difficulty Laryngoscope Size: Miller and 2 Grade View: Grade II Tube type: Oral Tube size: 7.0 mm Number of attempts: 1 Airway Equipment and Method: Stylet and Oral airway Placement Confirmation: ETT inserted through vocal cords under direct vision,  positive ETCO2 and breath sounds checked- equal and bilateral Secured at: 22 cm Tube secured with: Tape Dental Injury: Teeth and Oropharynx as per pre-operative assessment

## 2019-01-22 NOTE — Interval H&P Note (Signed)
History and Physical Interval Note:  01/22/2019 7:14 AM  Natalie Burton  has presented today for surgery, with the diagnosis of RIGHT BREAST CANCER.  The various methods of treatment have been discussed with the patient and family. After consideration of risks, benefits and other options for treatment, the patient has consented to  Procedure(s) with comments: BILATERAL SIMPLE MASTECTOMY WITH RIGHT SENTINEL LYMPH NODE MAPPING (Bilateral) - RNFA REQUESTED BILATERAL BREAST RECONSTRUCTION WITH PLACEMENT OF TISSUE EXPANDER AND ALLODERM (Bilateral) as a surgical intervention.  The patient's history has been reviewed, patient examined, no change in status, stable for surgery.  I have reviewed the patient's chart and labs.  Questions were answered to the patient's satisfaction.     [place]

## 2019-01-22 NOTE — Op Note (Signed)
Preoperative diagnosis: Stage I right breast cancer with strong family history of breast cancer  Postop diagnosis: Same  Procedure: Right simple mastectomy with right axillary sentinel lymph node mapping and left prophylactic mastectomy due to family history  Surgeon: Erroll Luna, MD  Assistant Dr. Iran Planas MD  Anesthesia: General with bilateral pectoral blocks  EBL: 100 cc  Specimen: Right breast tissue to pathology consisting of a right mastectomy with 3 right axillary sentinel nodes and left mastectomy to pathology in a separate container  IV fluids: Per anesthesia record  Indications for procedure: The patient is a 53 year old female diagnosed with stage I right breast cancer.  She is strong family history and desire bilateral mastectomy and reconstruction.  She was seen by plastic surgery preoperatively and evaluated in the multidisciplinary breast clinic.  All options were discussed to include breast conservation if desired.  After lengthy discussion she opted for bilateral simple mastectomy with right axillary sentinel node mapping.The surgical and non surgical options have been discussed with the patient.  Risks of surgery include bleeding,  Infection,  Flap necrosis,  Tissue loss,  Chronic pain, death, Numbness,  And the need for additional procedures.  Reconstruction options also have been discussed with the patient as well.  The patient agrees to proceed.Sentinel lymph node mapping and dissection has been discussed with the patient.  Risk of bleeding,  Infection,  Seroma formation,  Additional procedures,,  Shoulder weakness ,  Shoulder stiffness,  Nerve and blood vessel injury and reaction to the mapping dyes have been discussed.  Alternatives to surgery have been discussed with the patient.  The patient agrees to proceed.  Dissection was carried to the level 1 extra contents.  There were 2 hot sentinel nodes noted and these were removed.  Background counts approaches 0.  There is  no other adenopathy upon manual examination.  These were passed off the field and sent to pathology.  Hemostasis was achieved with cautery.  This was closed with 3-0 Vicryl and 4-0 Monocryl.  At this portion the case Dr. Feliciana Rossetti took over to complete the reconstruction.  Please see her operative note for details.  All counts were correct and the patient was hemodynamically stable. Description of procedure: The patient was met in the holding area.  She underwent bilateral pectoral block per anesthesia and injection of the right breast with technetium sulfur colloid per radiology protocol.  All questions were answered.  She was taken back to the operating.  She is placed supine upon the OR table.  After induction of general esthesia, both breasts were prepped and draped in sterile fashion timeout was performed.  Left side was performed first.  Curvilinear incision was made above and below the nipple were complex.  Skin flaps were raised to the clavicle, sternum medially into the inframammary fold.  The breast was then dissected off the chest wall taken the pectoralis fascia with it all the way to the lateral attachments and what was divided and removed, hemostasis achieved with cautery the wound was packed open.  Gloves were changed.  The right side was done.  Curvilinear incision was made above below the nipple areolar complex.  Superior skin flap raised up to the clavicle, medial to the sternum and then inferiorly to the inframammary fold.  The breast was mobilized with Merry Proud of the chest wall.  It was then divided at the lateral attachments and passed off the field.  Hemostasis achieved.  Neoprobe was used to identify the right axilla up to base and  hot node.  A 4 cm incision was made separate to the mastectomy.  3 right axillary sentinel nodes were removed which are level 1 nodes.  Hemostasis was achieved.  Wound closed with 3-0 Vicryl and 4-0 Monocryl.  Of note the long thoracic nerve, thoracodorsal trunk and  axillary vein were all preserved.  At this point time the wounds were packed open and Dr. Rebekah Chesterfield plastic surgery took over for reconstruction.  Please see her operative note for details.  All counts were correct and the patient was hemodynamically stable.

## 2019-01-22 NOTE — Interval H&P Note (Signed)
History and Physical Interval Note:  01/22/2019 7:09 AM  Natalie Burton  has presented today for surgery, with the diagnosis of RIGHT BREAST CANCER.  The various methods of treatment have been discussed with the patient and family. After consideration of risks, benefits and other options for treatment, the patient has consented to  Procedure(s) with comments: BILATERAL SIMPLE MASTECTOMY WITH RIGHT SENTINEL LYMPH NODE MAPPING (Bilateral) - RNFA REQUESTED BILATERAL BREAST RECONSTRUCTION WITH PLACEMENT OF TISSUE EXPANDER AND ALLODERM (Bilateral) as a surgical intervention.  The patient's history has been reviewed, patient examined, no change in status, stable for surgery.  I have reviewed the patient's chart and labs.  Questions were answered to the patient's satisfaction.     Arnoldo Hooker Bianca Raneri

## 2019-01-22 NOTE — H&P (Signed)
Natalie Burton  Location: [facility] Patient #: [id number] DOB: 22-Mar-1966 Married / Language: English / Race: White Female  History of Present Illness  Patient words: 53 year old female sent at the request of the [facility] [place] and Dr. Blanch Media for right breast cancer. Patient noted a mass 1 week ago in the right breast just below the nipple. Core biopsy with ultrasound guidance showed a 3.2 cm mass as she receptor positive progesterone receptor positive HER-2/neu negative grade 3. Patient complains of some mild bruising at the biopsy site. Mother had atypical breast condition necessitating bilateral mastectomy in her 44s. 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 HER2 FISH was performed by a technologist and cell imaging and analysis on the BioView. RATIO OF HER2/CEN17 SIGNALS 1.18 AVERAGE HER2 COPY NUMBER PER CELL 1.95 The ratio of HER2/CEN 17 is within the range < 2.0 of HER2/CEN 17 and a copy number of HER2 signals per cell is <4.0. Arch Pathol Lab Med 1:1,2018 Enid Cutter MD Pathologist, Electronic Signature ( Signed 11/30/2018) PROGNOSTIC INDICATORS Results: IMMUNOHISTOCHEMICAL AND MORPHOMETRIC ANALYSIS PERFORMED MANUALLY The tumor cells are EQUIVOCAL for Her2 (2+). HER2 by FISH will be PERFORMED and the RESULTS REPORTED SEPARATELY Estrogen Receptor: 80%, POSITIVE, STRONG STAINING INTENSITY Progesterone Receptor: 5%, POSITIVE, STRONG STAINING INTENSITY Proliferation Marker Ki67: 40% REFERENCE RANGE ESTROGEN RECEPTOR NEGATIVE 0% POSITIVE =>1% 1 of 3 FINAL for Natalie Burton, Natalie Burton [phone]) ADDITIONAL INFORMATION:(continued) REFERENCE RANGE PROGESTERONE RECEPTOR NEGATIVE 0% POSITIVE  =>1% All controls stained appropriately Vicente Males MD Pathologist, Electronic Signature ( Signed 11/29/2018) FINAL DIAGNOSIS Diagnosis Breast, right, needle core biopsy, 7 o'clock - INVASIVE DUCTAL CARCINOMA - DUCTAL CARCINOMA IN SITU - SEE COMMENT Microscopic Comment Based on the biopsy, the carcinoma appears Nottingham grade 3 of 3 and measures 1.8 cm in greatest linear extent. 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 Prognostic markers (ER/PR/ki-67/HER2) are pending and will be reported in an addendum. Dr. Jeannie Done has reviewed the case and agrees with above diagnosis. These results were called to [facility] on November 28, 2018. Thressa Sheller MD Pathologist, Electronic Signature (Case signed 11/28/2018)                CLINICAL DATA: Mass felt by the patient in the slightly lower outer right breast for the past 2 days. Mother diagnosed with breast cancer at age 1. Status right breast excision in 2014.  EXAM: DIGITAL DIAGNOSTIC BILATERAL MAMMOGRAM WITH CAD AND TOMO  ULTRASOUND RIGHT BREAST  COMPARISON: Previous exam(s).  ACR Breast Density Category c: The breast tissue is heterogeneously dense, which may obscure small masses.  FINDINGS: There is an interval irregular mass with pleomorphic microcalcifications in the inferior right breast slightly laterally, corresponding to the mass felt by the patient, marked with a metallic marker. There are no findings elsewhere in either breast suspicious for malignancy.  Mammographic images were processed with CAD.  On physical exam, the patient has an approximately 3 x 3 cm oval, firm, immobile, palpable mass centered in the 7 o'clock position of the right breast, 2 cm from the nipple. There are no palpable right axillary lymph nodes.  Targeted ultrasound is performed, showing a 3.2 x 3.2 x 1.7 cm irregular, heterogeneous, predominantly hypoechoic mass  with posterior acoustical shadowing in the  7 o'clock position of the right breast, 2 cm from the nipple. There is an ill-defined surrounding echogenic halo and the mass contains multiple pleomorphic calcifications.  Ultrasound of the right axilla demonstrated normal appearing right axillary lymph nodes.  IMPRESSION: 3.2 cm mass in the 7 o'clock position of the right breast with imaging features highly suspicious for malignancy.  RECOMMENDATION: Ultrasound-guided core needle biopsy of the 3.2 cm mass in the 7 o'clock position of the right breast. This has been discussed with the patient and scheduled at 8:30 a.m. on 11/27/2018.  I have discussed the findings and recommendations with the patient. Results were also provided in writing at the conclusion of the visit. If applicable, a reminder letter will be sent to the patient regarding the next appointment.  BI-RADS CATEGORY 5: Highly suggestive of malignancy.   Electronically Signed By: Claudie Revering M.D. On: 11/26/2018 16:06.  The patient is a 53 year old female.   Past Surgical History (Tanisha A. Owens [facility]ark, [place]; 12/03/2018 2:09 PM) Breast Biopsy Right. multiple Colon Polyp Removal - Colonoscopy Gallbladder Surgery - Laparoscopic Hysterectomy (not due to cancer) - Partial Oral Surgery  Diagnostic Studies History (Tanisha A. Owens [facility]ark, [place]; 12/03/2018 2:09 PM) Colonoscopy 1-5 years ago Mammogram within last year Pap Smear >5 years ago  Allergies (Tanisha A. Owens Shark, [place]; 12/03/2018 2:10 PM) No Known Drug Allergies [12/03/2018]: Allergies Reconciled  Medication History (Tanisha A. Owens Shark, [place]; 12/03/2018 2:10 PM) Escitalopram Oxalate (10MG Tablet, Oral) Active. Esomeprazole Magnesium (40MG Capsule DR, Oral) Active. Rizatriptan Benzoate (5MG Tablet, Oral) Active. Synthroid (175MCG Tablet, Oral) Active. Medications Reconciled  Social History Alcohol use Moderate alcohol use. Caffeine use Carbonated  beverages, Tea. No drug use Tobacco use Current some day smoker.  Family History  Colon Cancer Mother. Colon Polyps Mother. Depression Mother. Diabetes Mellitus Mother. Heart Disease Father. Hypertension Mother. Ischemic Bowel Disease Sister. Respiratory Condition Father. Thyroid problems Daughter.  Pregnancy / Birth History  Age at menarche 69 years. Contraceptive History Depo-provera. Gravida 2 Irregular periods Length (months) of breastfeeding 3-6 Maternal age 86-25 Para 2  Other Problems Cholelithiasis Depression Gastroesophageal Reflux Disease Lump In Breast Migraine Headache Sleep Apnea Thyroid Disease     Review of Systems  General Not Present- Appetite Loss, Chills, Fatigue, Fever, Night Sweats, Weight Gain and Weight Loss. Skin Not Present- Change in Wart/Mole, Dryness, Hives, Jaundice, New Lesions, Non-Healing Wounds, Rash and Ulcer. HEENT Not Present- Earache, Hearing Loss, Hoarseness, Nose Bleed, Oral Ulcers, Ringing in the Ears, Seasonal Allergies, Sinus Pain, Sore Throat, Visual Disturbances, Wears glasses/contact lenses and Yellow Eyes. Respiratory Not Present- Bloody sputum, Chronic Cough, Difficulty Breathing, Snoring and Wheezing. Breast Present- Breast Mass and Breast Pain. Not Present- Nipple Discharge and Skin Changes. Cardiovascular Not Present- Chest Pain, Difficulty Breathing Lying Down, Leg Cramps, Palpitations, Rapid Heart Rate, Shortness of Breath and Swelling of Extremities. Gastrointestinal Not Present- Abdominal Pain, Bloating, Bloody Stool, Change in Bowel Habits, Chronic diarrhea, Constipation, Difficulty Swallowing, Excessive gas, Gets full quickly at meals, Hemorrhoids, Indigestion, Nausea, Rectal Pain and Vomiting. Female Genitourinary Not Present- Frequency, Nocturia, Painful Urination, Pelvic Pain and Urgency. Musculoskeletal Not Present- Back Pain, Joint Pain, Joint Stiffness, Muscle Pain, Muscle  Weakness and Swelling of Extremities. Neurological Not Present- Decreased Memory, Fainting, Headaches, Numbness, Seizures, Tingling, Tremor, Trouble walking and Weakness. Psychiatric Not Present- Anxiety, Bipolar, Change in Sleep Pattern, Depression, Fearful and Frequent crying. Endocrine Not Present- Cold Intolerance, Excessive Hunger, Hair Changes, Heat Intolerance, Hot flashes and New Diabetes. Hematology Not Present- Blood Thinners, Easy Bruising, Excessive bleeding, Gland  problems, HIV and Persistent Infections. All other systems negative  Vitals  12/03/2018 2:09 PM Weight: 272.8 lb Height: 71in Body Surface Area: 2.41 m Body Mass Index: 38.05 kg/m  Temp.: 97.18F  Pulse: 77 (Regular)  BP: 126/84 (Sitting, Left Arm, Standard)        Physical Exam   General Mental Status-Alert. General Appearance-Consistent with stated age. Hydration-Well hydrated. Voice-Normal.  Head and Neck Head-normocephalic, atraumatic with no lesions or palpable masses. Trachea-midline. Thyroid Gland Characteristics - normal size and consistency.  Chest and Lung Exam Chest and lung exam reveals -quiet, even and easy respiratory effort with no use of accessory muscles and on auscultation, normal breath sounds, no adventitious sounds and normal vocal resonance. Inspection Chest Wall - Normal. Back - normal.  Breast Note: Right breast shows bruising at about 6:00 with a 3 cm mobile mass. Left breast is normal. Scar is noted on both breasts.  Neurologic Neurologic evaluation reveals -alert and oriented x 3 with no impairment of recent or remote memory. Mental Status-Normal.  Musculoskeletal Normal Exam - Left-Upper Extremity Strength Normal and Lower Extremity Strength Normal. Normal Exam - Right-Upper Extremity Strength Normal and Lower Extremity Strength Normal.  Lymphatic Head & Neck  General Head & Neck Lymphatics: Bilateral - Description  - Normal. Axillary  General Axillary Region: Bilateral - Description - Normal. Tenderness - Non Tender.    Assessment & Plan   BREAST CANCER, RIGHT (C50.911) Impression: Patient opted for bilateral simple mastectomy and right sentinel lymph node mapping Discussed treatment options for breast cancer to include breast conservation vs mastectomy with reconstruction. Pt has decided on mastectomy. Risk include bleeding, infection, flap necrosis, pain, numbness, recurrence, hematoma, other surgery needs. Pt understands and agrees to proceed. Risk of sentinel lymph node mapping include bleeding, infection, lymphedema, shoulder pain. stiffness, dye allergy. cosmetic deformity , blood clots, death, need for more surgery. Pt agres to proceed. with reconstruction.  Refer to medical oncology in [place] [facility] since she lives there Refer to plastic surgery  Genetics  Current Plans You are being scheduled for surgery- Our schedulers will call you.  You should hear from our office's scheduling department within 5 working days about the location, date, and time of surgery. We try to make accommodations for patient's preferences in scheduling surgery, but sometimes the OR schedule or the surgeon's schedule prevents [place] from making those accommodations.  If you have not heard from our office [phone]) in 5 working days, call the office and ask for your surgeon's nurse.  If you have other questions about your diagnosis, plan, or surgery, call the office and ask for your surgeon's nurse.  Pt Education - [facility] Breast Cancer Information Given - [facility] "Breast Journey" Package We discussed the staging and pathophysiology of breast cancer. We discussed all of the different options for treatment for breast cancer including surgery, chemotherapy, radiation therapy, Herceptin, and antiestrogen therapy. We discussed a sentinel lymph node biopsy as she does not appear to having lymph  node involvement right now. We discussed the performance of that with injection of radioactive tracer and blue dye. We discussed that she would have an incision underneath her axillary hairline. We discussed that there is a bout a 10-20% chance of having a positive node with a sentinel lymph node biopsy and we will await the permanent pathology to make any other first further decisions in terms of her treatment. One of these options might be to return to the operating room to perform an axillary lymph  node dissection. We discussed about a 1-2% risk lifetime of chronic shoulder pain as well as lymphedema associated with a sentinel lymph node biopsy. We discussed the options for treatment of the breast cancer which included lumpectomy versus a mastectomy. We discussed the performance of the lumpectomy with a wire placement. We discussed a 10-20% chance of a positive margin requiring reexcision in the operating room. We also discussed that she may need radiation therapy or antiestrogen therapy or both if she undergoes lumpectomy. We discussed the mastectomy and the postoperative care for that as well. We discussed that there is no difference in her survival whether she undergoes lumpectomy with radiation therapy or antiestrogen therapy versus a mastectomy. There is a slight difference in the local recurrence rate being 3-5% with lumpectomy and about 1% with a mastectomy. We discussed the risks of operation including bleeding, infection, possible reoperation. She understands her further therapy will be based on what her stages at the time of her operation.  Pt Education - [facility] breast cancer surgery: discussed with patient and provided information. Pt Education - [facility] Breast Biopsy HCI: discussed with patient and provided information. Pt Education - [facility] Breast Pains Education Pt Education - [facility] Mastectomy HCI  MALIGNANT NEOPLASM OF LOWER-OUTER QUADRANT OF RIGHT BREAST OF FEMALE, ESTROGEN RECEPTOR POSITIVE  (C50.511)

## 2019-01-22 NOTE — Anesthesia Postprocedure Evaluation (Signed)
Anesthesia Post Note  Patient: Maridel M Gioffre  Procedure(s) Performed: BILATERAL SIMPLE MASTECTOMY WITH RIGHT SENTINEL LYMPH NODE MAPPING (Bilateral Breast) BILATERAL BREAST RECONSTRUCTION WITH PLACEMENT OF TISSUE EXPANDER AND ALLODERM (Bilateral Breast)     Patient location during evaluation: PACU Anesthesia Type: General Level of consciousness: awake and alert Pain management: pain level controlled Vital Signs Assessment: post-procedure vital signs reviewed and stable Respiratory status: spontaneous breathing, nonlabored ventilation, respiratory function stable and patient connected to nasal cannula oxygen Cardiovascular status: blood pressure returned to baseline and stable Postop Assessment: no apparent nausea or vomiting Anesthetic complications: no    Last Vitals:  Vitals:   01/22/19 1730 01/22/19 1803  BP:  (!) 114/58  Pulse:  64  Resp:    Temp: 36.9 C 37.1 C  SpO2:  93%    Last Pain:  Vitals:   01/22/19 1803  TempSrc: Oral  PainSc:                  Catalina Gravel

## 2019-01-22 NOTE — Anesthesia Procedure Notes (Signed)
Anesthesia Regional Block: Pectoralis block   Pre-Anesthetic Checklist: ,, timeout performed, Correct Patient, Correct Site, Correct Laterality, Correct Procedure, Correct Position, site marked, Risks and benefits discussed,  Surgical consent,  Pre-op evaluation,  At surgeon's request and post-op pain management  Laterality: Right  Prep: chloraprep       Needles:  Injection technique: Single-shot  Needle Type: Echogenic Needle     Needle Length: 9cm  Needle Gauge: 21     Additional Needles:   Procedures:,,,, ultrasound used (permanent image in chart),,,,  Narrative:  Start time: 01/22/2019 6:45 AM End time: 01/22/2019 6:55 AM Injection made incrementally with aspirations every 5 mL.  Performed by: Personally  Anesthesiologist: Catalina Gravel, MD  Additional Notes: No pain on injection. No increased resistance to injection. Injection made in 5cc increments.  Good needle visualization.  Patient tolerated procedure well.

## 2019-01-22 NOTE — Op Note (Signed)
Operative Note   DATE OF OPERATION: 9.22.20  LOCATION: [facility]  SURGICAL DIVISION: Plastic Surgery  PREOPERATIVE DIAGNOSES:  1. Right breast cancer LOQ ER+  POSTOPERATIVE DIAGNOSES:  same  PROCEDURE:  1. Bilateral breast reconstruction with tissue expanders 2. Acellular dermis (Alloderm) for breast reconstruction 600 cm2  SURGEON: Irene Limbo MD MBA  ASSISTANT: Lonn Georgia RNFA  ANESTHESIA:  General.   EBL: 200 ml for entire procedure  COMPLICATIONS: None immediate.   INDICATIONS FOR PROCEDURE:  The patient, Natalie Burton, is a 53 y.o. female born on 12/16/1965, is here for immediate prepectoral exapnder based breast reconstruction following bilateral skin sparing mastectomies.   FINDINGS: Natrelle 133S-FV-12 T 400 ml tissue expanders placed bilateral, initial fill volume 275 ml air. RIGHT SN [id number] LEFT SN [id number]  DESCRIPTION OF PROCEDURE:  The patient's operative site was marked with the patient in the preoperative area. The patientwas taken to the operating room. SCDs were placed and IV antibiotics were given. The patient's operative site was prepped and draped in a sterile fashion. A time out was performed and all information was confirmed to be correct.Following completion mastectomies, reconstruction began on left side.Breast cavity was irrigated with solution containingAncef, gentamicin, and bacitracin. Hemostasis was ensured.A 19 Fr drain was placed in subcutaneous position laterally anda 15 Fr drain placed along inframammary foldof breast cavity. Eachsecured to skin with 2-0 nylon. Cavity irrigated with Betadinesaline solution.The tissue expanderwasprepared on back table prior in insertion. The expander was filled with airto275 ml air. Perforated acellular dermis was draped over anterior surface expander. The ADM was then secured to itself over posterior surface of expanderwith 4-0 chromic suture. Redundant folds acellular dermis excised so that  the ADM lie flat without folds over air filled expander.The expander was secured tolateral sternal border medially andpectoralis superiorly and laterallywith a0 vicryl.The ADM was secured to pectoralis muscle and chest wall along inferior border at inframammary foldwith 0 V lock suture.Lateral mastectomy flap plicated to chest with 0 vicryl suture. Skin closure completedwith 3-0 vicryl in fascial layer and 4-0 vicryl in dermis. Skin closure completed with 4-0 monocryl subcuticular and tissue adhesive.  I then directed attention to right chest. Breast cavity was irrigated with solution containingAncef, gentamicin, and bacitracin. Hemostasis was ensured.A 19 Fr drain was placed in subcutaneous position laterally anda 15 Fr drain placed along inframammary foldof breast cavity. Eachsecured to skin with 2-0 nylon. Cavity irrigated with Betadinesaline solution.The tissue expanderwasprepared on back table prior in insertion in similar manner. The expander was filled with airto271ml air. Perforated acellular dermis was draped over anterior surface expander. The ADM was then secured to itself over posterior surface of expanderwith 4-0 chromic suture. Redundant folds acellular dermis excised so that the ADM lie flat without folds over air filled expander.The expander was secured tolateral sternal border medially andpectoralis superiorly and laterallywith a0 vicryl.The ADM was secured to pectoralis muscle and chest wall along inferior border at inframammary foldwith 0 V lock suture.Lateral mastectomy flap plicated to chest with 0 vicryl suture. Skin closure completedwith 3-0 vicryl in fascial layer and 4-0 vicryl in dermis. Skin closure completed with 4-0 monocryl subcuticular and tissue adhesive. Tegaderms applied over bilateral chest. Dry dressing and breast binder applied.  The patient was allowed to wake from anesthesia, extubated and taken to the recovery room in satisfactory  condition.   SPECIMENS: none  DRAINS: 15 and 19 Fr JP in right and left subcutaneous drain  Irene Limbo, MD [facility] [phone], pin [phone]

## 2019-01-22 NOTE — Anesthesia Procedure Notes (Signed)
Anesthesia Regional Block: Pectoralis block   Pre-Anesthetic Checklist: ,, timeout performed, Correct Patient, Correct Site, Correct Laterality, Correct Procedure, Correct Position, site marked, Risks and benefits discussed,  Surgical consent,  Pre-op evaluation,  At surgeon's request and post-op pain management  Laterality: Left  Prep: chloraprep       Needles:  Injection technique: Single-shot  Needle Type: Echogenic Needle     Needle Length: 9cm  Needle Gauge: 21     Additional Needles:   Procedures:,,,, ultrasound used (permanent image in chart),,,,  Narrative:  Start time: 01/22/2019 6:55 AM End time: 01/22/2019 7:05 AM Injection made incrementally with aspirations every 5 mL.  Performed by: Personally  Anesthesiologist: Catalina Gravel, MD  Additional Notes: No pain on injection. No increased resistance to injection. Injection made in 5cc increments.  Good needle visualization.  Patient tolerated procedure well.

## 2019-01-22 NOTE — Transfer of Care (Signed)
Immediate Anesthesia Transfer of Care Note  Patient: Natalie Burton  Procedure(s) Performed: BILATERAL SIMPLE MASTECTOMY WITH RIGHT SENTINEL LYMPH NODE MAPPING (Bilateral Breast) BILATERAL BREAST RECONSTRUCTION WITH PLACEMENT OF TISSUE EXPANDER AND ALLODERM (Bilateral Breast)  Patient Location: PACU  Anesthesia Type:GA combined with regional for post-op pain  Level of Consciousness: awake, alert  and oriented  Airway & Oxygen Therapy: Patient Spontanous Breathing and Patient connected to nasal cannula oxygen  Post-op Assessment: Report given to RN, Post -op Vital signs reviewed and stable and Patient moving all extremities X 4  Post vital signs: Reviewed and stable  Last Vitals:  Vitals Value Taken Time  BP 128/75 01/22/19 1137  Temp    Pulse 86 01/22/19 1137  Resp 8 01/22/19 1137  SpO2 95 % 01/22/19 1137  Vitals shown include unvalidated device data.  Last Pain:  Vitals:   01/22/19 0606  TempSrc:   PainSc: 0-No pain      Patients Stated Pain Goal: 4 ([id number])  Complications: No apparent anesthesia complications

## 2019-01-23 ENCOUNTER — Encounter (HOSPITAL_COMMUNITY): Payer: Self-pay | Admitting: Surgery

## 2019-01-23 DIAGNOSIS — F17200 Nicotine dependence, unspecified, uncomplicated: Secondary | ICD-10-CM | POA: Diagnosis not present

## 2019-01-23 DIAGNOSIS — Z803 Family history of malignant neoplasm of breast: Secondary | ICD-10-CM | POA: Diagnosis not present

## 2019-01-23 DIAGNOSIS — Z6839 Body mass index (BMI) 39.0-39.9, adult: Secondary | ICD-10-CM | POA: Diagnosis not present

## 2019-01-23 DIAGNOSIS — G4733 Obstructive sleep apnea (adult) (pediatric): Secondary | ICD-10-CM | POA: Diagnosis not present

## 2019-01-23 DIAGNOSIS — K219 Gastro-esophageal reflux disease without esophagitis: Secondary | ICD-10-CM | POA: Diagnosis not present

## 2019-01-23 DIAGNOSIS — N62 Hypertrophy of breast: Secondary | ICD-10-CM | POA: Diagnosis not present

## 2019-01-23 DIAGNOSIS — C50511 Malignant neoplasm of lower-outer quadrant of right female breast: Secondary | ICD-10-CM | POA: Diagnosis not present

## 2019-01-23 DIAGNOSIS — Z170 Estrogen receptor positive status [ER+]: Secondary | ICD-10-CM | POA: Diagnosis not present

## 2019-01-23 MED ORDER — SULFAMETHOXAZOLE-TRIMETHOPRIM 400-80 MG PO TABS: 1.0000 | ORAL_TABLET | Freq: Two times a day (BID) | ORAL | 0 refills | Status: DC

## 2019-01-23 MED ORDER — OXYCODONE HCL 5 MG PO TABS: 5.0000 mg | ORAL_TABLET | ORAL | 0 refills | Status: DC | PRN

## 2019-01-23 NOTE — Discharge Summary (Signed)
Physician Discharge Summary  Patient ID: Natalie Burton MRN: [id number] DOB/AGE: 1966-03-02 53 y.o.  Admit date: 01/22/2019 Discharge date: 01/23/2019  Admission Diagnoses: Right breast cancer  Discharge Diagnoses:  Active Problems:   Breast cancer, right [facility])   Discharged Condition: stable  Hospital Course: Post operatively patient did well with pain controlled with oral medication, ambulatory with minimal assist and tolerating diet. She was instructed on incision and drain care.  Treatments: surgery: bilateral mastectomies with right sentinel node biopsy bilateral breast reconstruction with tissue expanders acellular dermis 9.22.20  Discharge Exam: Blood pressure 107/61, pulse (!) 54, temperature 98.4 F (36.9 C), temperature source Oral, resp. rate 16, height 5\' 10"  (1.778 m), weight 123.4 kg, SpO2 99 %. Incision/Wound: chest soft incisions intact Tegaderms in place right lateral chest ecchymoses without hematoma drains serosanguinous  Disposition: Discharge disposition: 01-Home or Self Care       Discharge Instructions    Call MD for:  redness, tenderness, or signs of infection (pain, swelling, bleeding, redness, odor or green/yellow discharge around incision site)   Complete by: As directed    Call MD for:  temperature >100.5   Complete by: As directed    Discharge instructions   Complete by: As directed    Ok to remove dressings and shower am 9.24.20. Soap and water ok, pat Tegaderms dry. Do not remove Tegaderms. No creams or ointments over incisions. Do not let drains dangle in shower, attach to lanyard or similar.Strip and record drains twice daily and bring log to clinic visit.  Breast binder or soft compression bra all other times.  Ok to raise arms above shoulders for bathing and dressing.  No house yard work or exercise until cleared by MD.   Recommend ibuprofen with meals as directed to aid with pain control. Ok to use Tylenol as well as needed for pain.    Driving Restrictions   Complete by: As directed    No driving for 2 weeks then no driving if taking narcotics   Lifting restrictions   Complete by: As directed    No lifting > 5 lbs until cleared by MD   Resume previous diet   Complete by: As directed      Allergies as of 01/23/2019      Reactions   Venlafaxine Shortness Of Breath   Wellbutrin [bupropion Hcl] Shortness Of Breath   Codeine Nausea And Vomiting   Zanaflex [tizanidine Hydrochloride]    Slurred speech      Medication List    TAKE these medications   cyclobenzaprine 10 MG tablet Commonly known as: FLEXERIL Take 10 mg by mouth 3 (three) times daily as needed for muscle spasms.   escitalopram 20 MG tablet Commonly known as: LEXAPRO Take 20 mg by mouth daily.   esomeprazole 40 MG capsule Commonly known as: NEXIUM Take 40 mg by mouth daily before breakfast.   ibuprofen 800 MG tablet Commonly known as: ADVIL Take 800 mg by mouth every 8 (eight) hours as needed for moderate pain.   oxyCODONE 5 MG immediate release tablet Commonly known as: Oxy IR/ROXICODONE Take 1 tablet (5 mg total) by mouth every 4 (four) hours as needed for moderate pain.   sulfamethoxazole-trimethoprim 400-80 MG tablet Commonly known as: Bactrim Take 1 tablet by mouth 2 (two) times daily.   Synthroid 175 MCG tablet Generic drug: levothyroxine Take 175 mcg by mouth daily before breakfast.      Follow-up Information    Irene Limbo, MD In 1 week.  Specialty: Plastic Surgery Why: as scheduled Contact information: [address] [place] [address] [phone]        Erroll Luna, MD. Schedule an appointment as soon as possible for a visit in 2 weeks.   Specialty: General Surgery Contact information: [address] [place] [address] [phone]           Signed: Irene Limbo 01/23/2019, 7:35 AM

## 2019-01-23 NOTE — Progress Notes (Signed)
1 Day Post-Op   Subjective/Chief Complaint: Doing well   Objective: Vital signs in last 24 hours: Temp:  [98.3 F (36.8 C)-98.7 F (37.1 C)] 98.4 F (36.9 C) (09/23 0520) Pulse Rate:  [54-86] 54 (09/23 0520) Resp:  [8-18] 16 (09/23 0520) BP: (107-128)/(58-80) 107/61 (09/23 0520) SpO2:  [91 %-99 %] 99 % (09/23 0520) Last BM Date: (PTA)  Intake/Output from previous day: 09/22 0701 - 09/23 0700 In: 2439.5 [P.O.:200; I.V.:2189.5; IV Piggyback:50] Out: [id number] [Urine:960; Drains:355; Blood:200] Intake/Output this shift: No intake/output data recorded.  Incision/Wound:flaps viable no hematoma   Lab Results:  No results for input(s): WBC, HGB, HCT, PLT in the last 72 hours. BMET No results for input(s): NA, K, CL, CO2, GLUCOSE, BUN, CREATININE, CALCIUM in the last 72 hours. PT/INR No results for input(s): LABPROT, INR in the last 72 hours. ABG No results for input(s): PHART, HCO3 in the last 72 hours.  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    Anti-infectives: Anti-infectives (From admission, onward)   Start     Dose/Rate Route Frequency Ordered Stop   01/23/19 0000  sulfamethoxazole-trimethoprim (BACTRIM) 400-80 MG tablet     1 tablet Oral 2 times daily 01/23/19 0735     01/22/19 1900  ceFAZolin (ANCEF) IVPB 1 g/50 mL premix     1 g 100 mL/hr over 30 Minutes Intravenous Every 8 hours 01/22/19 1801 01/23/19 2159   01/22/19 0730  bacitracin 50,000 Units, gentamicin (GARAMYCIN) 80 mg, ceFAZolin (ANCEF) 1 g in sodium chloride 0.9 % 1,000 mL      Irrigation Once 01/22/19 0720 01/22/19 0801   01/22/19 0630  ceFAZolin (ANCEF) 3 g in dextrose 5 % 50 mL IVPB     3 g 100 mL/hr over 30 Minutes Intravenous To [facility]Stay [facility] 01/21/19 1458 01/22/19 0745      Assessment/Plan: s/p Procedure(s) with  comments: BILATERAL SIMPLE MASTECTOMY WITH RIGHT SENTINEL LYMPH NODE MAPPING (Bilateral) - RNFA REQUESTED BILATERAL BREAST RECONSTRUCTION WITH PLACEMENT OF TISSUE EXPANDER AND ALLODERM (Bilateral) Discharge   LOS: 0 days    Marcello Moores A Kaydence Menard 01/23/2019

## 2019-01-29 DIAGNOSIS — Z9013 Acquired absence of bilateral breasts and nipples: Secondary | ICD-10-CM | POA: Insufficient documentation

## 2019-01-29 LAB — SURGICAL PATHOLOGY

## 2019-01-31 HISTORY — PX: MASTECTOMY: SHX3

## 2019-02-05 DIAGNOSIS — C50511 Malignant neoplasm of lower-outer quadrant of right female breast: Secondary | ICD-10-CM | POA: Diagnosis not present

## 2019-02-25 ENCOUNTER — Encounter (HOSPITAL_COMMUNITY): Payer: Self-pay | Admitting: Oncology

## 2019-02-25 MED FILL — ESCITALOPRAM 10 MG TABLET: 10 | 90 days supply | Qty: 90 | Fill #0

## 2019-02-26 ENCOUNTER — Ambulatory Visit: Payer: Self-pay | Admitting: Surgery

## 2019-02-26 DIAGNOSIS — Z170 Estrogen receptor positive status [ER+]: Secondary | ICD-10-CM

## 2019-02-26 DIAGNOSIS — C50511 Malignant neoplasm of lower-outer quadrant of right female breast: Secondary | ICD-10-CM

## 2019-03-04 DIAGNOSIS — T451X1A Poisoning by antineoplastic and immunosuppressive drugs, accidental (unintentional), initial encounter: Secondary | ICD-10-CM | POA: Diagnosis not present

## 2019-03-04 DIAGNOSIS — C50511 Malignant neoplasm of lower-outer quadrant of right female breast: Secondary | ICD-10-CM | POA: Diagnosis not present

## 2019-03-07 MED FILL — ESCITALOPRAM 10 MG TABLET: 10 | 90 days supply | Qty: 90 | Fill #0

## 2019-03-08 ENCOUNTER — Other Ambulatory Visit (HOSPITAL_COMMUNITY)
Admission: RE | Admit: 2019-03-08 | Discharge: 2019-03-08 | Disposition: A | Discharge status: Home / Self Care | Payer: 59 | Source: Ambulatory Visit | Attending: Surgery | Admitting: Surgery

## 2019-03-08 DIAGNOSIS — Z01812 Encounter for preprocedural laboratory examination: Secondary | ICD-10-CM | POA: Diagnosis not present

## 2019-03-08 DIAGNOSIS — Z20828 Contact with and (suspected) exposure to other viral communicable diseases: Secondary | ICD-10-CM | POA: Diagnosis not present

## 2019-03-09 LAB — NOVEL CORONAVIRUS, NAA (HOSP ORDER, SEND-OUT TO REF LAB; TAT 18-24 HRS): SARS-CoV-2, NAA: NOT DETECTED

## 2019-03-11 ENCOUNTER — Encounter (HOSPITAL_COMMUNITY): Payer: Self-pay | Admitting: *Deleted

## 2019-03-11 ENCOUNTER — Other Ambulatory Visit: Payer: Self-pay

## 2019-03-11 MED ORDER — DEXTROSE 5 % IV SOLN
3.0000 g | INTRAVENOUS | Status: AC
  Administered 2019-03-12: 3 g via INTRAVENOUS
  Filled 2019-03-11: qty 3000
  Filled 2019-03-11: qty 3

## 2019-03-11 NOTE — Progress Notes (Signed)
Spoke with pt for pre-op call. Pt denies cardiac history, HTN or Diabetes.  Covid test done 03/08/19 and it's negative. Pt states she's been in quarantine since the test was done.  Visitation policy reviewed with pt and she voiced understanding.

## 2019-03-11 NOTE — Anesthesia Preprocedure Evaluation (Addendum)
Anesthesia Evaluation  Patient identified by MRN, date of birth, ID band Patient awake    Reviewed: Allergy & Precautions, NPO status , Patient's Chart, lab work & pertinent test results  History of Anesthesia Complications Negative for: history of anesthetic complications  Airway Mallampati: II  TM Distance: >3 FB Neck ROM: Full    Dental no notable dental hx.    Pulmonary sleep apnea , former smoker,    Pulmonary exam normal        Cardiovascular negative cardio ROS Normal cardiovascular exam     Neuro/Psych  Headaches, Anxiety    GI/Hepatic Neg liver ROS, GERD  Medicated and Controlled,  Endo/Other  Hypothyroidism   Renal/GU negative Renal ROS  negative genitourinary   Musculoskeletal negative musculoskeletal ROS (+)   Abdominal   Peds  Hematology negative hematology ROS (+)   Anesthesia Other Findings Day of surgery medications reviewed with patient.  Reproductive/Obstetrics negative OB ROS                            Anesthesia Physical Anesthesia Plan  ASA: II  Anesthesia Plan: General   Post-op Pain Management:    Induction: Intravenous  PONV Risk Score and Plan: 2 and Treatment may vary due to age or medical condition, Midazolam and Ondansetron  Airway Management Planned: LMA  Additional Equipment: None  Intra-op Plan:   Post-operative Plan: Extubation in OR  Informed Consent: I have reviewed the patients History and Physical, chart, labs and discussed the procedure including the risks, benefits and alternatives for the proposed anesthesia with the patient or authorized representative who has indicated his/her understanding and acceptance.     Dental advisory given  Plan Discussed with: CRNA  Anesthesia Plan Comments:        Anesthesia Quick Evaluation

## 2019-03-12 ENCOUNTER — Encounter (HOSPITAL_COMMUNITY)
Admission: RE | Disposition: A | Discharge status: Home / Self Care | Payer: Self-pay | Source: Home / Self Care | Attending: Surgery

## 2019-03-12 ENCOUNTER — Ambulatory Visit (HOSPITAL_COMMUNITY)
Admission: RE | Admit: 2019-03-12 | Discharge: 2019-03-12 | Disposition: A | Discharge status: Home / Self Care | Payer: 59 | Attending: Surgery | Admitting: Surgery

## 2019-03-12 ENCOUNTER — Ambulatory Visit (HOSPITAL_COMMUNITY): Payer: 59 | Admitting: Anesthesiology

## 2019-03-12 ENCOUNTER — Ambulatory Visit (HOSPITAL_COMMUNITY): Payer: 59

## 2019-03-12 ENCOUNTER — Encounter (HOSPITAL_COMMUNITY): Payer: Self-pay | Admitting: *Deleted

## 2019-03-12 DIAGNOSIS — Z885 Allergy status to narcotic agent status: Secondary | ICD-10-CM | POA: Diagnosis not present

## 2019-03-12 DIAGNOSIS — C50911 Malignant neoplasm of unspecified site of right female breast: Secondary | ICD-10-CM | POA: Insufficient documentation

## 2019-03-12 DIAGNOSIS — G4733 Obstructive sleep apnea (adult) (pediatric): Secondary | ICD-10-CM | POA: Diagnosis not present

## 2019-03-12 DIAGNOSIS — K589 Irritable bowel syndrome without diarrhea: Secondary | ICD-10-CM | POA: Insufficient documentation

## 2019-03-12 DIAGNOSIS — Z95828 Presence of other vascular implants and grafts: Secondary | ICD-10-CM

## 2019-03-12 DIAGNOSIS — Z79899 Other long term (current) drug therapy: Secondary | ICD-10-CM | POA: Insufficient documentation

## 2019-03-12 DIAGNOSIS — E039 Hypothyroidism, unspecified: Secondary | ICD-10-CM | POA: Diagnosis not present

## 2019-03-12 DIAGNOSIS — R0600 Dyspnea, unspecified: Secondary | ICD-10-CM | POA: Diagnosis not present

## 2019-03-12 DIAGNOSIS — Z79890 Hormone replacement therapy (postmenopausal): Secondary | ICD-10-CM | POA: Insufficient documentation

## 2019-03-12 DIAGNOSIS — K219 Gastro-esophageal reflux disease without esophagitis: Secondary | ICD-10-CM | POA: Diagnosis not present

## 2019-03-12 DIAGNOSIS — G4730 Sleep apnea, unspecified: Secondary | ICD-10-CM | POA: Insufficient documentation

## 2019-03-12 DIAGNOSIS — F419 Anxiety disorder, unspecified: Secondary | ICD-10-CM | POA: Insufficient documentation

## 2019-03-12 DIAGNOSIS — Z419 Encounter for procedure for purposes other than remedying health state, unspecified: Secondary | ICD-10-CM

## 2019-03-12 DIAGNOSIS — Z87891 Personal history of nicotine dependence: Secondary | ICD-10-CM | POA: Diagnosis not present

## 2019-03-12 DIAGNOSIS — C50511 Malignant neoplasm of lower-outer quadrant of right female breast: Secondary | ICD-10-CM | POA: Diagnosis not present

## 2019-03-12 DIAGNOSIS — Z4682 Encounter for fitting and adjustment of non-vascular catheter: Secondary | ICD-10-CM | POA: Diagnosis not present

## 2019-03-12 HISTORY — PX: PORTACATH PLACEMENT: SHX2246

## 2019-03-12 LAB — CBC WITH DIFFERENTIAL/PLATELET
Abs Immature Granulocytes: 0.02 10*3/uL (ref 0.00–0.07)
Basophils Absolute: 0 10*3/uL (ref 0.0–0.1)
Basophils Relative: 0 %
Eosinophils Absolute: 0.2 10*3/uL (ref 0.0–0.5)
Eosinophils Relative: 4 %
HCT: 40 % (ref 36.0–46.0)
Hemoglobin: 12.8 g/dL (ref 12.0–15.0)
Immature Granulocytes: 0 %
Lymphocytes Relative: 27 %
Lymphs Abs: 1.3 10*3/uL (ref 0.7–4.0)
MCH: 29.9 pg (ref 26.0–34.0)
MCHC: 32 g/dL (ref 30.0–36.0)
MCV: 93.5 fL (ref 80.0–100.0)
Monocytes Absolute: 0.3 10*3/uL (ref 0.1–1.0)
Monocytes Relative: 6 %
Neutro Abs: 3 10*3/uL (ref 1.7–7.7)
Neutrophils Relative %: 63 %
Platelets: 104 10*3/uL — ABNORMAL LOW (ref 150–400)
RBC: 4.28 MIL/uL (ref 3.87–5.11)
RDW: 12.3 % (ref 11.5–15.5)
WBC: 4.8 10*3/uL (ref 4.0–10.5)
nRBC: 0 % (ref 0.0–0.2)

## 2019-03-12 LAB — COMPREHENSIVE METABOLIC PANEL
ALT: 30 U/L (ref 0–44)
AST: 19 U/L (ref 15–41)
Albumin: 3.7 g/dL (ref 3.5–5.0)
Alkaline Phosphatase: 87 U/L (ref 38–126)
Anion gap: 12 (ref 5–15)
BUN: 8 mg/dL (ref 6–20)
CO2: 23 mmol/L (ref 22–32)
Calcium: 9.2 mg/dL (ref 8.9–10.3)
Chloride: 104 mmol/L (ref 98–111)
Creatinine, Ser: 0.75 mg/dL (ref 0.44–1.00)
GFR calc Af Amer: >60 mL/min (ref >60)
GFR calc non Af Amer: >60 mL/min (ref >60)
Glucose, Bld: 108 mg/dL — ABNORMAL HIGH (ref 70–99)
Potassium: 3.9 mmol/L (ref 3.5–5.1)
Sodium: 139 mmol/L (ref 135–145)
Total Bilirubin: 0.3 mg/dL (ref 0.3–1.2)
Total Protein: 7 g/dL (ref 6.5–8.1)

## 2019-03-12 SURGERY — INSERTION, TUNNELED CENTRAL VENOUS DEVICE, WITH PORT
Anesthesia: General | Site: Breast

## 2019-03-12 MED ORDER — FENTANYL CITRATE (PF) 100 MCG/2ML IJ SOLN
INTRAMUSCULAR | Status: AC
  Filled 2019-03-12: qty 2

## 2019-03-12 MED ORDER — ACETAMINOPHEN 500 MG PO TABS
1000.0000 mg | ORAL_TABLET | Freq: Once | ORAL | Status: AC
  Administered 2019-03-12: 1000 mg via ORAL
  Filled 2019-03-12: qty 2

## 2019-03-12 MED ORDER — CELECOXIB 200 MG PO CAPS
200.0000 mg | ORAL_CAPSULE | ORAL | Status: AC
  Administered 2019-03-12: 200 mg via ORAL
  Filled 2019-03-12: qty 1

## 2019-03-12 MED ORDER — FENTANYL CITRATE (PF) 250 MCG/5ML IJ SOLN
INTRAMUSCULAR | Status: AC
  Filled 2019-03-12: qty 5

## 2019-03-12 MED ORDER — 0.9 % SODIUM CHLORIDE (POUR BTL) OPTIME
TOPICAL | Status: DC | PRN
  Administered 2019-03-12: 09:00:00 1000 mL

## 2019-03-12 MED ORDER — IBUPROFEN 800 MG PO TABS: 800.0000 mg | ORAL_TABLET | Freq: Three times a day (TID) | ORAL | 0 refills | Status: DC | PRN

## 2019-03-12 MED ORDER — LACTATED RINGERS IV SOLN
INTRAVENOUS | Status: DC
  Administered 2019-03-12: 08:00:00 via INTRAVENOUS

## 2019-03-12 MED ORDER — SODIUM CHLORIDE 0.9 % IV SOLN
INTRAVENOUS | Status: DC | PRN
  Administered 2019-03-12: 10:00:00

## 2019-03-12 MED ORDER — LIDOCAINE 2% (20 MG/ML) 5 ML SYRINGE
INTRAMUSCULAR | Status: DC | PRN
  Administered 2019-03-12: 100 mg via INTRAVENOUS

## 2019-03-12 MED ORDER — CHLORHEXIDINE GLUCONATE 4 % EX LIQD: 1.0000 "application " | Freq: Once | CUTANEOUS | Status: DC

## 2019-03-12 MED ORDER — HYDROCODONE-ACETAMINOPHEN 5-325 MG PO TABS: 1.0000 | ORAL_TABLET | Freq: Four times a day (QID) | ORAL | 0 refills | Status: DC | PRN

## 2019-03-12 MED ORDER — FENTANYL CITRATE (PF) 100 MCG/2ML IJ SOLN
25.0000 ug | INTRAMUSCULAR | Status: DC | PRN
  Administered 2019-03-12: 50 ug via INTRAVENOUS

## 2019-03-12 MED ORDER — BUPIVACAINE-EPINEPHRINE (PF) 0.5% -1:200000 IJ SOLN
INTRAMUSCULAR | Status: DC | PRN
  Administered 2019-03-12: 10 mL

## 2019-03-12 MED ORDER — MIDAZOLAM HCL 2 MG/2ML IJ SOLN
INTRAMUSCULAR | Status: DC | PRN
  Administered 2019-03-12: 2 mg via INTRAVENOUS

## 2019-03-12 MED ORDER — BUPIVACAINE-EPINEPHRINE 0.5% -1:200000 IJ SOLN
INTRAMUSCULAR | Status: AC
  Filled 2019-03-12: qty 1

## 2019-03-12 MED ORDER — PROPOFOL 10 MG/ML IV BOLUS
INTRAVENOUS | Status: AC
  Filled 2019-03-12: qty 20

## 2019-03-12 MED ORDER — STERILE WATER FOR IRRIGATION IR SOLN
Status: DC | PRN
  Administered 2019-03-12: 1000 mL

## 2019-03-12 MED ORDER — PROMETHAZINE HCL 25 MG/ML IJ SOLN: 6.2500 mg | INTRAMUSCULAR | Status: DC | PRN

## 2019-03-12 MED ORDER — PROPOFOL 10 MG/ML IV BOLUS
INTRAVENOUS | Status: DC | PRN
  Administered 2019-03-12: 200 mg via INTRAVENOUS

## 2019-03-12 MED ORDER — FENTANYL CITRATE (PF) 250 MCG/5ML IJ SOLN: INTRAMUSCULAR | Status: DC | PRN

## 2019-03-12 MED ORDER — ONDANSETRON HCL 4 MG/2ML IJ SOLN
INTRAMUSCULAR | Status: DC | PRN
  Administered 2019-03-12: 4 mg via INTRAVENOUS

## 2019-03-12 MED ORDER — HEPARIN SOD (PORK) LOCK FLUSH 100 UNIT/ML IV SOLN
INTRAVENOUS | Status: DC | PRN
  Administered 2019-03-12: 400 [IU] via INTRAVENOUS

## 2019-03-12 MED ORDER — HEPARIN SOD (PORK) LOCK FLUSH 100 UNIT/ML IV SOLN
INTRAVENOUS | Status: AC
  Filled 2019-03-12: qty 5

## 2019-03-12 MED ORDER — DEXAMETHASONE SODIUM PHOSPHATE 10 MG/ML IJ SOLN
INTRAMUSCULAR | Status: DC | PRN
  Administered 2019-03-12: 5 mg via INTRAVENOUS

## 2019-03-12 MED ORDER — MIDAZOLAM HCL 2 MG/2ML IJ SOLN
INTRAMUSCULAR | Status: AC
  Filled 2019-03-12: qty 2

## 2019-03-12 MED ORDER — FENTANYL CITRATE (PF) 250 MCG/5ML IJ SOLN
INTRAMUSCULAR | Status: DC | PRN
  Administered 2019-03-12: 25 ug via INTRAVENOUS

## 2019-03-12 MED ORDER — GABAPENTIN 300 MG PO CAPS
300.0000 mg | ORAL_CAPSULE | ORAL | Status: AC
  Administered 2019-03-12: 300 mg via ORAL
  Filled 2019-03-12: qty 1

## 2019-03-12 MED ORDER — SODIUM CHLORIDE 0.9 % IV SOLN
INTRAVENOUS | Status: AC
  Filled 2019-03-12: qty 1.2

## 2019-03-12 SURGICAL SUPPLY — 39 items
BAG DECANTER FOR FLEXI CONT (MISCELLANEOUS) ×2 IMPLANT
CHLORAPREP W/TINT 26 (MISCELLANEOUS) ×2 IMPLANT
COVER SURGICAL LIGHT HANDLE (MISCELLANEOUS) ×2 IMPLANT
COVER TRANSDUCER ULTRASND GEL (DRAPE) ×2 IMPLANT
COVER WAND RF STERILE (DRAPES) ×2 IMPLANT
DERMABOND ADVANCED (GAUZE/BANDAGES/DRESSINGS) ×1
DERMABOND ADVANCED .7 DNX12 (GAUZE/BANDAGES/DRESSINGS) ×1 IMPLANT
DRAPE C-ARM 42X120 X-RAY (DRAPES) ×2 IMPLANT
ELECT CAUTERY BLADE 6.4 (BLADE) ×2 IMPLANT
ELECT REM PT RETURN 9FT ADLT (ELECTROSURGICAL) ×2
ELECTRODE REM PT RTRN 9FT ADLT (ELECTROSURGICAL) ×1 IMPLANT
GAUZE 4X4 16PLY RFD (DISPOSABLE) ×2 IMPLANT
GEL ULTRASOUND 20GR AQUASONIC (MISCELLANEOUS) IMPLANT
GLOVE BIO SURGEON STRL SZ8 (GLOVE) ×2 IMPLANT
GLOVE BIOGEL PI IND STRL 8 (GLOVE) ×1 IMPLANT
GLOVE BIOGEL PI INDICATOR 8 (GLOVE) ×1
GOWN STRL REUS W/ TWL LRG LVL3 (GOWN DISPOSABLE) ×1 IMPLANT
GOWN STRL REUS W/ TWL XL LVL3 (GOWN DISPOSABLE) ×1 IMPLANT
GOWN STRL REUS W/TWL LRG LVL3 (GOWN DISPOSABLE) ×1
GOWN STRL REUS W/TWL XL LVL3 (GOWN DISPOSABLE) ×1
INTRODUCER COOK 11FR (CATHETERS) IMPLANT
KIT BASIN OR (CUSTOM PROCEDURE TRAY) ×2 IMPLANT
KIT PORT POWER 8FR ISP CVUE (Port) ×2 IMPLANT
KIT TURNOVER KIT B (KITS) ×2 IMPLANT
NS IRRIG 1000ML POUR BTL (IV SOLUTION) ×2 IMPLANT
PAD ARMBOARD 7.5X6 YLW CONV (MISCELLANEOUS) ×2 IMPLANT
PENCIL BUTTON HOLSTER BLD 10FT (ELECTRODE) ×2 IMPLANT
POSITIONER HEAD DONUT 9IN (MISCELLANEOUS) ×2 IMPLANT
SET INTRODUCER 12FR PACEMAKER (INTRODUCER) IMPLANT
SET SHEATH INTRODUCER 10FR (MISCELLANEOUS) IMPLANT
SHEATH COOK PEEL AWAY SET 9F (SHEATH) IMPLANT
SUT MNCRL AB 4-0 PS2 18 (SUTURE) ×2 IMPLANT
SUT PROLENE 2 0 SH 30 (SUTURE) ×2 IMPLANT
SUT VIC AB 3-0 SH 27 (SUTURE) ×1
SUT VIC AB 3-0 SH 27X BRD (SUTURE) ×1 IMPLANT
SYR 5ML LUER SLIP (SYRINGE) ×2 IMPLANT
TOWEL GREEN STERILE (TOWEL DISPOSABLE) ×2 IMPLANT
TOWEL GREEN STERILE FF (TOWEL DISPOSABLE) ×2 IMPLANT
TRAY LAPAROSCOPIC MC (CUSTOM PROCEDURE TRAY) ×2 IMPLANT

## 2019-03-12 NOTE — Discharge Instructions (Signed)
    PORT-A-CATH: POST OP INSTRUCTIONS  Always review your discharge instruction sheet given to you by the facility where your surgery was performed.   1. A prescription for pain medication may be given to you upon discharge. Take your pain medication as prescribed, if needed. If narcotic pain medicine is not needed, then you make take acetaminophen (Tylenol) or ibuprofen (Advil) as needed.  2. Take your usually prescribed medications unless otherwise directed. 3. If you need a refill on your pain medication, please contact our office. All narcotic pain medicine now requires a paper prescription.  Phoned in and fax refills are no longer allowed by law.  Prescriptions will not be filled after 5 pm or on weekends.  4. You should follow a light diet for the remainder of the day after your procedure. 5. Most patients will experience some mild swelling and/or bruising in the area of the incision. It may take several days to resolve. 6. It is common to experience some constipation if taking pain medication after surgery. Increasing fluid intake and taking a stool softener (such as Colace) will usually help or prevent this problem from occurring. A mild laxative (Milk of Magnesia or Miralax) should be taken according to package directions if there are no bowel movements after 48 hours.  7. Unless discharge instructions indicate otherwise, you may remove your bandages 48 hours after surgery, and you may shower at that time. You may have steri-strips (small white skin tapes) in place directly over the incision.  These strips should be left on the skin for 7-10 days.  If your surgeon used Dermabond (skin glue) on the incision, you may shower in 24 hours.  The glue will flake off over the next 2-3 weeks.  8. If your port is left accessed at the end of surgery (needle left in port), the dressing cannot get wet and should only by changed by a healthcare professional. When the port is no longer accessed (when the  needle has been removed), follow step 7.   9. ACTIVITIES:  Limit activity involving your arms for the next 72 hours. Do no strenuous exercise or activity for 1 week. You may drive when you are no longer taking prescription pain medication, you can comfortably wear a seatbelt, and you can maneuver your car. 10.You may need to see your doctor in the office for a follow-up appointment.  Please       check with your doctor.  11.When you receive a new Port-a-Cath, you will get a product guide and        ID card.  Please keep them in case you need them.  WHEN TO CALL YOUR DOCTOR ([phone]): 1. Fever over 101.0 2. Chills 3. Continued bleeding from incision 4. Increased redness and tenderness at the site 5. Shortness of breath, difficulty breathing   The clinic staff is available to answer your questions during regular business hours. Please don't hesitate to call and ask to speak to one of the nurses or medical assistants for clinical concerns. If you have a medical emergency, go to the nearest emergency room or call 911.  A surgeon from [facility] is always on call at the hospital.     For further information, please visit [email or web address]      

## 2019-03-12 NOTE — Interval H&P Note (Signed)
History and Physical Interval Note:  03/12/2019 7:55 AM  Natalie Burton  has presented today for surgery, with the diagnosis of poor venous access.  The various methods of treatment have been discussed with the patient and family. After consideration of risks, benefits and other options for treatment, the patient has consented to  Procedure(s): INSERTION PORT-A-CATH WITH ULTRASOUND (N/A) as a surgical intervention.  The patient's history has been reviewed, patient examined, no change in status, stable for surgery.  I have reviewed the patient's chart and labs.  Questions were answered to the patient's satisfaction.     Early

## 2019-03-12 NOTE — Anesthesia Procedure Notes (Signed)
Procedure Name: LMA Insertion Performed by: Janace Litten, CRNA Pre-anesthesia Checklist: Patient identified, Emergency Drugs available, Suction available and Patient being monitored Patient Re-evaluated:Patient Re-evaluated prior to induction Oxygen Delivery Method: Circle System Utilized Preoxygenation: Pre-oxygenation with 100% oxygen Induction Type: IV induction Ventilation: Mask ventilation without difficulty LMA: LMA inserted LMA Size: 4.0 Number of attempts: 1 Placement Confirmation: positive ETCO2 Tube secured with: Tape Dental Injury: Teeth and Oropharynx as per pre-operative assessment

## 2019-03-12 NOTE — Anesthesia Postprocedure Evaluation (Signed)
Anesthesia Post Note  Patient: Natalie Burton  Procedure(s) Performed: INSERTION PORT-A-CATH WITH ULTRASOUND (N/A Breast)     Patient location during evaluation: PACU Anesthesia Type: General Level of consciousness: awake and alert and oriented Pain management: pain level controlled Vital Signs Assessment: post-procedure vital signs reviewed and stable Respiratory status: spontaneous breathing, nonlabored ventilation and respiratory function stable Cardiovascular status: blood pressure returned to baseline Postop Assessment: no apparent nausea or vomiting Anesthetic complications: no    Last Vitals:  Vitals:   03/12/19 1015 03/12/19 1026  BP: 137/84 (!) 148/96  Pulse: 63 65  Resp: 10 14  Temp:    SpO2: 100% 99%    Last Pain:  Vitals:   03/12/19 1026  TempSrc:   PainSc: [place]

## 2019-03-12 NOTE — Op Note (Addendum)
Preoperative diagnosis: PAC needed for chemotherapy   Postoperative diagnosis: Same  Procedure: Portacath Placement with U/S and C arm guidence  Surgeon: Turner Daniels, MD, FACS  Anesthesia: General and 0.5 % marcaine with epinephrine  Clinical History and Indications: The patient is getting ready to begin chemotherapy for her cancer. She  needs a Port-A-Cath for venous access. Risk of bleeding, infection,  Collapse lung,  Death,  DVT,  Organ injury,  Mediastinal injury,  Injury to heart,  Injury to blood vessels,  Nerves,  Migration of catheter,  Embolization of catheter and the need for more surgery.  Description of Procedure: I have seen the patient in the holding area and confirmed the plans for the procedure as noted above. I reviewed the risks and complications again and the patient has no further questions. She wishes to proceed.   The patient was then taken to the operating room. After satisfactory general  anesthesia had been obtained the upper chest and lower neck were prepped and draped as a sterile field. The timeout was done.  The right internal jugular vein  was entered under U/S guidance  and the guidewire threaded into the superior vena cava right atrial area under fluoroscopic guidance. An incision was then made on the anterior chest wall and a subcutaneous pocket fashioned for the port reservoir.  The port tubing was then brought through a subcutaneous tunnel from the port site to the guidewire site.  The port and catheter were attached, locked  and flushed. The catheter was measured and cut to appropriate length.The dilator and peel-away sheath were then advanced over the guidewire while monitoring this with fluoroscopy. The guidewire and dilator were removed and the tubing threaded to approximately 20 cm. The peel-away sheath was then removed. The catheter aspirated and flushed easily. Using fluoroscopy the tip was in the superior vena cava right atrial junction area. It  aspirated and flushed easily. That aspirated and flushed easily.  The reservoir was secured to the fascia with 1 sutures of 2-0 Prolene. A final check with fluoroscopy was done to make sure we had no kinks and good positioning of the tip of the catheter. Everything appeared to be okay. The catheter was aspirated, flushed with dilute heparin and then concentrated aqueous heparin.  The incision was then closed with interrupted 3-0 Vicryl, and 4-0 Monocryl subcuticular with Dermabond on the skin.  There were no operative complications. Estimated blood loss was minimal. All counts were correct. The patient tolerated the procedure well.  Turner Daniels, MD, FACS

## 2019-03-12 NOTE — H&P (Signed)
Natalie Burton is an 53 y.o. female.   Chief Complaint: poor venous access HPI: Pt presents for port placement. She needs chemotherapy for breast cancer   Past Medical History:  Diagnosis Date  . Acid reflux   . Anxiety   . Breast mass, right 02/27/2013   Excision 03/18/13. Papilloma with focal ADH   . Cancer (HCC)    breast  . Headache    migraines  . Hypothyroid   . IBS (irritable bowel syndrome)    both IBS-C and IBS-D  . Sleep apnea    does not use a cpap    Past Surgical History:  Procedure Laterality Date  . ABDOMINAL HYSTERECTOMY  2008   vag  . BREAST EXCISIONAL BIOPSY Right 2014  . BREAST LUMPECTOMY WITH NEEDLE LOCALIZATION Right 03/18/2013   Procedure: BREAST LUMPECTOMY WITH NEEDLE LOCALIZATION;  Surgeon: Haywood Lasso, MD;  Location: [place];  Service: General;  Laterality: Right;  . BREAST RECONSTRUCTION WITH PLACEMENT OF TISSUE EXPANDER AND ALLODERM Bilateral 01/22/2019   Procedure: BILATERAL BREAST RECONSTRUCTION WITH PLACEMENT OF TISSUE EXPANDER AND ALLODERM;  Surgeon: Irene Limbo, MD;  Location: [place];  Service: Plastics;  Laterality: Bilateral;  . CHOLECYSTECTOMY  1991   lap choli  . MASTECTOMY W/ SENTINEL NODE BIOPSY Bilateral 01/22/2019   Procedure: BILATERAL SIMPLE MASTECTOMY WITH RIGHT SENTINEL LYMPH NODE MAPPING;  Surgeon: Erroll Luna, MD;  Location: [place];  Service: General;  Laterality: Bilateral;  RNFA REQUESTED  . TUBAL LIGATION  1998    Family History  Problem Relation Age of Onset  . Cancer Mother        colon, breast  . Breast cancer Mother   . Cancer Father        non hodgkins lymphoma   Social History:  reports that she quit smoking about 2 years ago. She smoked 0.25 packs per day. She has never used smokeless tobacco. She reports current alcohol use. She reports that she does not use drugs.  Allergies:  Allergies  Allergen Reactions  . Venlafaxine Shortness Of Breath  . Wellbutrin [Bupropion Hcl]  Shortness Of Breath  . Codeine Nausea And Vomiting  . Morphine And Related Itching  . Zanaflex [Tizanidine Hydrochloride]     Slurred speech    Medications Prior to Admission  Medication Sig Dispense Refill  . cyclobenzaprine (FLEXERIL) 10 MG tablet Take 10 mg by mouth 3 (three) times daily as needed for muscle spasms.    Marland Kitchen escitalopram (LEXAPRO) 10 MG tablet Take 10 mg by mouth daily.     Marland Kitchen esomeprazole (NEXIUM) 40 MG capsule Take 40 mg by mouth daily before breakfast.    . ibuprofen (ADVIL) 200 MG tablet Take 800 mg by mouth every 8 (eight) hours as needed for moderate pain.     Marland Kitchen levothyroxine (SYNTHROID) 175 MCG tablet Take 175 mcg by mouth daily before breakfast.       No results found for this or any previous visit (from the past 48 hour(s)). No results found.  Review of Systems  All other systems reviewed and are negative.   Blood pressure (!) 156/78, pulse 67, temperature 98.3 F (36.8 C), temperature source Oral, resp. rate 20, height 5\' 10"  (1.778 m), weight 123.8 kg, SpO2 98 %. Physical Exam  Constitutional: She appears well-developed.  HENT:  Head: Normocephalic.  Eyes: Pupils are equal, round, and reactive to light.  Neck: Normal range of motion.  Cardiovascular: Normal rate.  Respiratory: Effort normal.  Incisions healed  Musculoskeletal: Normal range of motion.  Neurological: She is alert.  Skin: Skin is warm.     Assessment/Plan Poor venous access   Port placement   Risk of bleeding infection collapse lung bleeding in the chest catheter failure DVT  Agrees to proceed   Turner Daniels, MD 03/12/2019, 7:50 AM

## 2019-03-12 NOTE — Transfer of Care (Signed)
Immediate Anesthesia Transfer of Care Note  Patient: Natalie Burton  Procedure(s) Performed: INSERTION PORT-A-CATH WITH ULTRASOUND (N/A Breast)  Patient Location: PACU  Anesthesia Type:General  Level of Consciousness: awake, alert  and patient cooperative  Airway & Oxygen Therapy: Patient Spontanous Breathing  Post-op Assessment: Report given to RN and Post -op Vital signs reviewed and stable  Post vital signs: Reviewed and stable  Last Vitals:  Vitals Value Taken Time  BP 142/95 03/12/19 0957  Temp    Pulse 74 03/12/19 0957  Resp 9 03/12/19 0957  SpO2 98 % 03/12/19 0957  Vitals shown include unvalidated device data.  Last Pain:  Vitals:   03/12/19 0743  TempSrc:   PainSc: 0-No pain      Patients Stated Pain Goal: 3 ([id number])  Complications: No apparent anesthesia complications

## 2019-03-13 ENCOUNTER — Encounter (HOSPITAL_COMMUNITY): Payer: Self-pay | Admitting: Surgery

## 2019-03-13 DIAGNOSIS — C50511 Malignant neoplasm of lower-outer quadrant of right female breast: Secondary | ICD-10-CM | POA: Diagnosis not present

## 2019-03-13 DIAGNOSIS — Z5111 Encounter for antineoplastic chemotherapy: Secondary | ICD-10-CM | POA: Diagnosis not present

## 2019-03-15 DIAGNOSIS — Z5189 Encounter for other specified aftercare: Secondary | ICD-10-CM | POA: Diagnosis not present

## 2019-03-15 DIAGNOSIS — C50511 Malignant neoplasm of lower-outer quadrant of right female breast: Secondary | ICD-10-CM | POA: Diagnosis not present

## 2019-03-19 DIAGNOSIS — Z760 Encounter for issue of repeat prescription: Secondary | ICD-10-CM | POA: Diagnosis not present

## 2019-03-22 DIAGNOSIS — C50511 Malignant neoplasm of lower-outer quadrant of right female breast: Secondary | ICD-10-CM | POA: Diagnosis not present

## 2019-04-03 DIAGNOSIS — C50511 Malignant neoplasm of lower-outer quadrant of right female breast: Secondary | ICD-10-CM | POA: Diagnosis not present

## 2019-04-03 DIAGNOSIS — Z170 Estrogen receptor positive status [ER+]: Secondary | ICD-10-CM | POA: Diagnosis not present

## 2019-04-18 MED FILL — SYNTHROID 175 MCG TABLET: 175 | 90 days supply | Qty: 90 | Fill #0

## 2019-04-18 MED FILL — ESOMEPRAZOLE MAG DR 40 MG C: 40 | 90 days supply | Qty: 180 | Fill #0

## 2019-04-23 DIAGNOSIS — C50511 Malignant neoplasm of lower-outer quadrant of right female breast: Secondary | ICD-10-CM | POA: Diagnosis not present

## 2019-05-06 DIAGNOSIS — C50511 Malignant neoplasm of lower-outer quadrant of right female breast: Secondary | ICD-10-CM | POA: Diagnosis not present

## 2019-05-06 DIAGNOSIS — Z79899 Other long term (current) drug therapy: Secondary | ICD-10-CM | POA: Diagnosis not present

## 2019-05-08 DIAGNOSIS — R509 Fever, unspecified: Secondary | ICD-10-CM | POA: Diagnosis not present

## 2019-05-08 DIAGNOSIS — R6889 Other general symptoms and signs: Secondary | ICD-10-CM | POA: Diagnosis not present

## 2019-05-08 DIAGNOSIS — R5383 Other fatigue: Secondary | ICD-10-CM | POA: Diagnosis not present

## 2019-05-08 DIAGNOSIS — Z20822 Contact with and (suspected) exposure to covid-19: Secondary | ICD-10-CM | POA: Diagnosis not present

## 2019-05-09 DIAGNOSIS — R509 Fever, unspecified: Secondary | ICD-10-CM | POA: Diagnosis not present

## 2019-05-09 DIAGNOSIS — Z20822 Contact with and (suspected) exposure to covid-19: Secondary | ICD-10-CM | POA: Diagnosis not present

## 2019-05-15 DIAGNOSIS — C50511 Malignant neoplasm of lower-outer quadrant of right female breast: Secondary | ICD-10-CM | POA: Diagnosis not present

## 2019-05-15 DIAGNOSIS — R509 Fever, unspecified: Secondary | ICD-10-CM | POA: Diagnosis not present

## 2019-05-20 DIAGNOSIS — C50511 Malignant neoplasm of lower-outer quadrant of right female breast: Secondary | ICD-10-CM | POA: Diagnosis not present

## 2019-05-20 DIAGNOSIS — T451X1D Poisoning by antineoplastic and immunosuppressive drugs, accidental (unintentional), subsequent encounter: Secondary | ICD-10-CM | POA: Diagnosis not present

## 2019-05-27 DIAGNOSIS — C50511 Malignant neoplasm of lower-outer quadrant of right female breast: Secondary | ICD-10-CM | POA: Diagnosis not present

## 2019-05-27 DIAGNOSIS — Z79899 Other long term (current) drug therapy: Secondary | ICD-10-CM | POA: Diagnosis not present

## 2019-05-27 DIAGNOSIS — D6481 Anemia due to antineoplastic chemotherapy: Secondary | ICD-10-CM | POA: Diagnosis not present

## 2019-05-30 DIAGNOSIS — Z170 Estrogen receptor positive status [ER+]: Secondary | ICD-10-CM | POA: Diagnosis not present

## 2019-05-30 DIAGNOSIS — Z9013 Acquired absence of bilateral breasts and nipples: Secondary | ICD-10-CM | POA: Diagnosis not present

## 2019-05-30 DIAGNOSIS — C50511 Malignant neoplasm of lower-outer quadrant of right female breast: Secondary | ICD-10-CM | POA: Diagnosis not present

## 2019-05-30 MED FILL — ESCITALOPRAM 10 MG TABLET: 10 | 90 days supply | Qty: 90 | Fill #1

## 2019-06-05 DIAGNOSIS — C50511 Malignant neoplasm of lower-outer quadrant of right female breast: Secondary | ICD-10-CM | POA: Diagnosis not present

## 2019-06-26 DIAGNOSIS — C50511 Malignant neoplasm of lower-outer quadrant of right female breast: Secondary | ICD-10-CM | POA: Diagnosis not present

## 2019-06-27 DIAGNOSIS — Z5189 Encounter for other specified aftercare: Secondary | ICD-10-CM | POA: Diagnosis not present

## 2019-06-27 DIAGNOSIS — C50511 Malignant neoplasm of lower-outer quadrant of right female breast: Secondary | ICD-10-CM | POA: Diagnosis not present

## 2019-07-16 MED FILL — SYNTHROID 175 MCG TABLET: 175 | 90 days supply | Qty: 90 | Fill #0

## 2019-07-17 DIAGNOSIS — C50511 Malignant neoplasm of lower-outer quadrant of right female breast: Secondary | ICD-10-CM | POA: Diagnosis not present

## 2019-07-17 DIAGNOSIS — Z170 Estrogen receptor positive status [ER+]: Secondary | ICD-10-CM | POA: Diagnosis not present

## 2019-07-17 DIAGNOSIS — Z9013 Acquired absence of bilateral breasts and nipples: Secondary | ICD-10-CM | POA: Diagnosis not present

## 2019-07-17 NOTE — H&P (Signed)
Subjective:     Patient ID: Natalie Burton is a 54 y.o. female.  Follow-up  6 months post op. Completed adjuvant chemotherapy February 2021. Plan implant exchange next month.  Presented with palpable right breast mass. MMG/US showed 3.2 x 3.2 x 1.7 cm mass in the 7 o'clock position of the right breast, 2 cmfn. Mass contains multiple pleomorphic calcifications. Right axilla benign. 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 Right side fibroadenoma and radial scar. EndoPredict score 4.7 and adjuvant chemotherapy recommended (6 doses, q 3 weeks).  History prior right lumpectomy for ADH. Mother with colon ca and "pre cancer" breasts per patient, underwent bilateral mastectomies no reconstruction.   Prior 40 B. Right mastectomy 651 g Left 772 g  Stopped all nicotine via e cig prior to surgery.  PMH includes OSA on CPAP. 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  Husband is a retired Engineer, structural.  Review of Systems    Objective:   Physical Exam  Cardiovascular: Normal rate, regular rhythm and normal heart sounds.  Pulmonary/Chest: Effort normal and breath sounds normal.   Chest:Soft scars maturing expanded bilateral Right chest port Bilateral lateral chest soft tissue rolls Left upper inner breast with some rippling Over right tissue expander port has rotated laterally Step off contour superior poles greater on left    Assessment:     Right breast ca LOQ ER+ S/p bilateral SSM, prepectoral TE/ADM (Alloderm) reconstruction Adjuvant chemotherapy    Plan:     Asked her to ask her Oncologist if able to have port removed at next surgery- sees her tomorrow.   Plan implant exchange, lipofilling bilateral chest, liposuction chest, possible removal port.   Reviewed saline vs silicone, shaped vs round. As  she is in prepectoral position,HCG or capacity filled silicone implants may offer reduced risk visible rippling.Reviewed MRI or USsurveillance for rupture with silicone implants. Reviewed examples for 4th generation, capacity filled 4th generation, and HCG implants vs saline implants. Reviewed risks AP flipping that may be more noticeable with 5th generation implants, may require surgery to correct. Reviewed textured vs smooth, former associated with ALCL risk.Reviewed size in part guided by width chest, cannot assure her cup size. Plansmooth round silicone capacity filled.  Reviewed fat grafting. Reviewed purpose of this to thicken flaps limit visible rippling and aid in contour. Reviewed donor site pain, need for compression, variable take graft, fat necrosis that presents as masses and may require work up.Patient desires to proceed with this. Recommend she purchase Spanx type garment for post op use. I recommend additional liposuction lateral chest wall.  Reviewed implant size largely based on CW, cannot assure cup size. Plan OP surgery, no drains anticipated.  Completed ASPS consent with physician patient checklist regarding breast implants.  Rx for Bactrim and Norco given. Has flexeril at home.  20 minutes spent with patient over half in counseling  Natrelle 133S-FV-12 T 400 ml tissue expanders placed bilateral,  fill volume 400 ml saline RIGHT Fill volume 420 ml saline LEFT

## 2019-07-18 DIAGNOSIS — R600 Localized edema: Secondary | ICD-10-CM | POA: Diagnosis not present

## 2019-07-18 DIAGNOSIS — D649 Anemia, unspecified: Secondary | ICD-10-CM | POA: Diagnosis not present

## 2019-07-18 DIAGNOSIS — C50511 Malignant neoplasm of lower-outer quadrant of right female breast: Secondary | ICD-10-CM | POA: Diagnosis not present

## 2019-07-18 DIAGNOSIS — R945 Abnormal results of liver function studies: Secondary | ICD-10-CM | POA: Diagnosis not present

## 2019-07-18 DIAGNOSIS — H538 Other visual disturbances: Secondary | ICD-10-CM | POA: Diagnosis not present

## 2019-07-19 DIAGNOSIS — F330 Major depressive disorder, recurrent, mild: Secondary | ICD-10-CM | POA: Diagnosis not present

## 2019-07-19 DIAGNOSIS — E039 Hypothyroidism, unspecified: Secondary | ICD-10-CM | POA: Diagnosis not present

## 2019-07-19 DIAGNOSIS — R5381 Other malaise: Secondary | ICD-10-CM | POA: Diagnosis not present

## 2019-07-19 DIAGNOSIS — G4733 Obstructive sleep apnea (adult) (pediatric): Secondary | ICD-10-CM | POA: Diagnosis not present

## 2019-07-19 DIAGNOSIS — E669 Obesity, unspecified: Secondary | ICD-10-CM | POA: Diagnosis not present

## 2019-07-19 DIAGNOSIS — C50511 Malignant neoplasm of lower-outer quadrant of right female breast: Secondary | ICD-10-CM | POA: Diagnosis not present

## 2019-07-19 DIAGNOSIS — E559 Vitamin D deficiency, unspecified: Secondary | ICD-10-CM | POA: Diagnosis not present

## 2019-07-19 DIAGNOSIS — R232 Flushing: Secondary | ICD-10-CM | POA: Diagnosis not present

## 2019-07-19 DIAGNOSIS — E782 Mixed hyperlipidemia: Secondary | ICD-10-CM | POA: Diagnosis not present

## 2019-07-21 DIAGNOSIS — R600 Localized edema: Secondary | ICD-10-CM | POA: Insufficient documentation

## 2019-07-22 MED FILL — ESOMEPRAZOLE MAG DR 40 MG C: 40 | 90 days supply | Qty: 180 | Fill #0

## 2019-07-29 NOTE — Progress Notes (Signed)
[place], [place] - [address] [facility]. [address] [place] [address] Phone: [phone] Fax: [place], [place] [address] Phone: [phone] Fax: [phone]    Your procedure is scheduled on Friday, April 2nd.  Report to [facility] Main Entrance "A" at 5:30 A.M., and check in at the Admitting office.  Call this number if you have problems the morning of surgery:  [phone]  Call [phone] if you have any questions prior to your surgery date Monday-Friday 8am-4pm   Remember:  Do not eat after midnight the night before your surgery  You may drink clear liquids until 4:30 the morning of your surgery.   Clear liquids allowed are: Water, Non-Citrus Juices (without pulp), Carbonated Beverages, Clear Tea, Black Coffee Only, and Gatorade    Take these medicines the morning of surgery with A SIP OF WATER anastrozole (ARIMIDEX)  escitalopram (LEXAPRO)  esomeprazole (NEXIUM)  levothyroxine (SYNTHROID)  If needed - cyclobenzaprine (FLEXERIL), rizatriptan (MAXALT)    As of today, stop taking all Aspirin (unless instructed by your doctor) and other Aspirin containing products, Vitamins, Fish Oils, and Herbal Medications. Also stop all NSAIDS i.e. Advil, Ibuprofen, Motrin, Aleve, Anaprox, Naproxen, BC, Goody Powders, and all Supplements.   No Smoking of any kind, Tobacco, or Alcohol products 24 hours prior to your procedure. If you use a CPAP at night, you may bring all equipment for your overnight stay.                        Do not wear jewelry, make up, or nail polish            Do not wear lotions, powders, perfumes,, or deodorant.            Do not shave 48 hours prior to surgery.              Do not bring valuables to the hospital.            [facility] is not responsible for any belongings or valuables.   Contacts, glasses, dentures or bridgework may not be  worn into surgery.      For patients admitted to the hospital, discharge time will be determined by your treatment team.   Patients discharged the day of surgery will not be allowed to drive home, and someone needs to stay with them for 24 hours.  Special instructions:   [facility]- Preparing For Surgery  Before surgery, you can play an important role. Because skin is not sterile, your skin needs to be as free of germs as possible. You can reduce the number of germs on your skin by washing with CHG (chlorahexidine gluconate) Soap before surgery.  CHG is an antiseptic cleaner which kills germs and bonds with the skin to continue killing germs even after washing.    Oral Hygiene is also important to reduce your risk of infection.  Remember - BRUSH YOUR TEETH THE MORNING OF SURGERY WITH YOUR REGULAR TOOTHPASTE  Please do not use if you have an allergy to CHG or antibacterial soaps. If your skin becomes reddened/irritated stop using the CHG.  Do not shave (including legs and underarms) for at least 48 hours prior to first CHG shower. It is OK to shave your face.  Please follow these instructions carefully.   1. Shower the NIGHT BEFORE SURGERY and the MORNING OF SURGERY  with CHG Soap.   2. If you chose to wash your hair, wash your hair first as usual with your normal shampoo.  3. After you shampoo, rinse your hair and body thoroughly to remove the shampoo.  4. Use CHG as you would any other liquid soap. You can apply CHG directly to the skin and wash gently with a scrungie or a clean washcloth.   5. Apply the CHG Soap to your body ONLY FROM THE NECK DOWN.  Do not use on open wounds or open sores. Avoid contact with your eyes, ears, mouth and genitals (private parts). Wash Face and genitals (private parts)  with your normal soap.   6. Wash thoroughly, paying special attention to the area where your surgery will be performed.  7. Thoroughly rinse your body with warm water from the neck  down.  8. DO NOT shower/wash with your normal soap after using and rinsing off the CHG Soap.  9. Pat yourself dry with a CLEAN TOWEL.  10. Wear CLEAN PAJAMAS to bed the night before surgery, wear comfortable clothes the morning of surgery  11. Place CLEAN SHEETS on your bed the night of your first shower and DO NOT SLEEP WITH PETS.  Day of Surgery:  Do not apply any deodorants/lotions.  Please wear clean clothes to the hospital/surgery center.   Remember to brush your teeth WITH YOUR REGULAR TOOTHPASTE.   Please read over the following fact sheets that you were given.

## 2019-07-30 ENCOUNTER — Other Ambulatory Visit: Payer: Self-pay

## 2019-07-30 ENCOUNTER — Other Ambulatory Visit (HOSPITAL_COMMUNITY)
Admission: RE | Admit: 2019-07-30 | Discharge: 2019-07-30 | Disposition: A | Discharge status: Home / Self Care | Payer: 59 | Source: Ambulatory Visit | Attending: Plastic Surgery | Admitting: Plastic Surgery

## 2019-07-30 ENCOUNTER — Encounter (HOSPITAL_COMMUNITY): Payer: Self-pay

## 2019-07-30 ENCOUNTER — Encounter (HOSPITAL_COMMUNITY)
Admission: RE | Admit: 2019-07-30 | Discharge: 2019-07-30 | Disposition: A | Discharge status: Home / Self Care | Payer: 59 | Source: Ambulatory Visit | Attending: Plastic Surgery | Admitting: Plastic Surgery

## 2019-07-30 DIAGNOSIS — Z20822 Contact with and (suspected) exposure to covid-19: Secondary | ICD-10-CM | POA: Insufficient documentation

## 2019-07-30 DIAGNOSIS — Z01812 Encounter for preprocedural laboratory examination: Secondary | ICD-10-CM | POA: Diagnosis not present

## 2019-07-30 HISTORY — DX: Anemia, unspecified: D64.9

## 2019-07-30 LAB — CBC WITH DIFFERENTIAL/PLATELET
Abs Immature Granulocytes: 0.02 10*3/uL (ref 0.00–0.07)
Basophils Absolute: 0.1 10*3/uL (ref 0.0–0.1)
Basophils Relative: 1 %
Eosinophils Absolute: 0.4 10*3/uL (ref 0.0–0.5)
Eosinophils Relative: 6 %
HCT: 38.1 % (ref 36.0–46.0)
Hemoglobin: 12.2 g/dL (ref 12.0–15.0)
Immature Granulocytes: 0 %
Lymphocytes Relative: 18 %
Lymphs Abs: 1.2 10*3/uL (ref 0.7–4.0)
MCH: 30.3 pg (ref 26.0–34.0)
MCHC: 32 g/dL (ref 30.0–36.0)
MCV: 94.8 fL (ref 80.0–100.0)
Monocytes Absolute: 0.5 10*3/uL (ref 0.1–1.0)
Monocytes Relative: 8 %
Neutro Abs: 4.7 10*3/uL (ref 1.7–7.7)
Neutrophils Relative %: 67 %
Platelets: 304 10*3/uL (ref 150–400)
RBC: 4.02 MIL/uL (ref 3.87–5.11)
RDW: 13.5 % (ref 11.5–15.5)
WBC: 6.9 10*3/uL (ref 4.0–10.5)
nRBC: 0 % (ref 0.0–0.2)

## 2019-07-30 LAB — BASIC METABOLIC PANEL
Anion gap: 12 (ref 5–15)
BUN: 9 mg/dL (ref 6–20)
CO2: 25 mmol/L (ref 22–32)
Calcium: 9.2 mg/dL (ref 8.9–10.3)
Chloride: 103 mmol/L (ref 98–111)
Creatinine, Ser: 0.78 mg/dL (ref 0.44–1.00)
GFR calc Af Amer: >60 mL/min (ref >60)
GFR calc non Af Amer: >60 mL/min (ref >60)
Glucose, Bld: 118 mg/dL — ABNORMAL HIGH (ref 70–99)
Potassium: 3.8 mmol/L (ref 3.5–5.1)
Sodium: 140 mmol/L (ref 135–145)

## 2019-07-30 LAB — SARS CORONAVIRUS 2 (TAT 6-24 HRS): SARS Coronavirus 2: NEGATIVE

## 2019-07-30 NOTE — Progress Notes (Signed)
PCP:  Edyth Gunnels, MD Cardiologist:  Jenne Campus, MD  EKG:  N/A CXR:  N/A ECHO:  06/21/17 Stress Test:  06/21/17 Cardiac Cath:  Denies  Covid test 07/30/19  Patient denies shortness of breath, fever, cough, and chest pain at PAT appointment.  Patient verbalized understanding of instructions provided today at the PAT appointment.  Patient asked to review instructions at home and day of surgery.

## 2019-08-01 MED ORDER — DEXTROSE 5 % IV SOLN
3.0000 g | INTRAVENOUS | Status: AC
  Administered 2019-08-02: 3 g via INTRAVENOUS
  Filled 2019-08-01: qty 3000
  Filled 2019-08-01: qty 3

## 2019-08-02 ENCOUNTER — Encounter (HOSPITAL_COMMUNITY): Payer: Self-pay | Admitting: Plastic Surgery

## 2019-08-02 ENCOUNTER — Encounter (HOSPITAL_COMMUNITY)
Admission: RE | Disposition: A | Discharge status: Home / Self Care | Payer: Self-pay | Source: Home / Self Care | Attending: Plastic Surgery

## 2019-08-02 ENCOUNTER — Ambulatory Visit (HOSPITAL_COMMUNITY)
Admission: RE | Admit: 2019-08-02 | Discharge: 2019-08-02 | Disposition: A | Discharge status: Home / Self Care | Payer: 59 | Attending: Plastic Surgery | Admitting: Plastic Surgery

## 2019-08-02 ENCOUNTER — Other Ambulatory Visit: Payer: Self-pay

## 2019-08-02 ENCOUNTER — Ambulatory Visit (HOSPITAL_COMMUNITY): Payer: 59 | Admitting: Certified Registered Nurse Anesthetist

## 2019-08-02 DIAGNOSIS — Z6838 Body mass index (BMI) 38.0-38.9, adult: Secondary | ICD-10-CM | POA: Diagnosis not present

## 2019-08-02 DIAGNOSIS — F419 Anxiety disorder, unspecified: Secondary | ICD-10-CM | POA: Diagnosis not present

## 2019-08-02 DIAGNOSIS — Z87891 Personal history of nicotine dependence: Secondary | ICD-10-CM | POA: Insufficient documentation

## 2019-08-02 DIAGNOSIS — Z9221 Personal history of antineoplastic chemotherapy: Secondary | ICD-10-CM | POA: Diagnosis not present

## 2019-08-02 DIAGNOSIS — Z9013 Acquired absence of bilateral breasts and nipples: Secondary | ICD-10-CM | POA: Insufficient documentation

## 2019-08-02 DIAGNOSIS — G4733 Obstructive sleep apnea (adult) (pediatric): Secondary | ICD-10-CM | POA: Insufficient documentation

## 2019-08-02 DIAGNOSIS — E039 Hypothyroidism, unspecified: Secondary | ICD-10-CM | POA: Insufficient documentation

## 2019-08-02 DIAGNOSIS — D225 Melanocytic nevi of trunk: Secondary | ICD-10-CM | POA: Insufficient documentation

## 2019-08-02 DIAGNOSIS — Z800 Family history of malignant neoplasm of digestive organs: Secondary | ICD-10-CM | POA: Insufficient documentation

## 2019-08-02 DIAGNOSIS — Z452 Encounter for adjustment and management of vascular access device: Secondary | ICD-10-CM | POA: Insufficient documentation

## 2019-08-02 DIAGNOSIS — Z421 Encounter for breast reconstruction following mastectomy: Secondary | ICD-10-CM | POA: Insufficient documentation

## 2019-08-02 DIAGNOSIS — Z853 Personal history of malignant neoplasm of breast: Secondary | ICD-10-CM | POA: Insufficient documentation

## 2019-08-02 DIAGNOSIS — R519 Headache, unspecified: Secondary | ICD-10-CM | POA: Insufficient documentation

## 2019-08-02 DIAGNOSIS — E669 Obesity, unspecified: Secondary | ICD-10-CM | POA: Diagnosis not present

## 2019-08-02 DIAGNOSIS — K219 Gastro-esophageal reflux disease without esophagitis: Secondary | ICD-10-CM | POA: Diagnosis not present

## 2019-08-02 HISTORY — PX: PORT-A-CATH REMOVAL: SHX5289

## 2019-08-02 HISTORY — PX: AUGMENTATION MAMMAPLASTY: SUR837

## 2019-08-02 HISTORY — PX: LIPOSUCTION WITH LIPOFILLING: SHX6436

## 2019-08-02 HISTORY — PX: SKIN BIOPSY: SHX1

## 2019-08-02 HISTORY — PX: REMOVAL OF BILATERAL TISSUE EXPANDERS WITH PLACEMENT OF BILATERAL BREAST IMPLANTS: SHX6431

## 2019-08-02 SURGERY — REMOVAL, TISSUE EXPANDER, BREAST, BILATERAL, WITH BILATERAL IMPLANT IMPLANT INSERTION
Anesthesia: General | Site: Chest | Laterality: Right

## 2019-08-02 MED ORDER — FENTANYL CITRATE (PF) 250 MCG/5ML IJ SOLN
INTRAMUSCULAR | Status: AC
  Filled 2019-08-02: qty 5

## 2019-08-02 MED ORDER — LACTATED RINGERS IV SOLN: INTRAVENOUS | Status: DC | PRN

## 2019-08-02 MED ORDER — STERILE WATER FOR IRRIGATION IR SOLN
Status: DC | PRN
  Administered 2019-08-02: 1000 mL

## 2019-08-02 MED ORDER — LIDOCAINE 2% (20 MG/ML) 5 ML SYRINGE
INTRAMUSCULAR | Status: AC
  Filled 2019-08-02: qty 5

## 2019-08-02 MED ORDER — PROMETHAZINE HCL 25 MG/ML IJ SOLN
INTRAMUSCULAR | Status: AC
  Filled 2019-08-02: qty 1

## 2019-08-02 MED ORDER — DEXAMETHASONE SODIUM PHOSPHATE 10 MG/ML IJ SOLN
INTRAMUSCULAR | Status: AC
  Filled 2019-08-02: qty 1

## 2019-08-02 MED ORDER — MIDAZOLAM HCL 2 MG/2ML IJ SOLN
INTRAMUSCULAR | Status: AC
  Filled 2019-08-02: qty 2

## 2019-08-02 MED ORDER — PROPOFOL 10 MG/ML IV BOLUS
INTRAVENOUS | Status: AC
  Filled 2019-08-02: qty 20

## 2019-08-02 MED ORDER — ARTIFICIAL TEARS OPHTHALMIC OINT
TOPICAL_OINTMENT | OPHTHALMIC | Status: AC
  Filled 2019-08-02: qty 3.5

## 2019-08-02 MED ORDER — ROCURONIUM BROMIDE 10 MG/ML (PF) SYRINGE
PREFILLED_SYRINGE | INTRAVENOUS | Status: AC
  Filled 2019-08-02: qty 10

## 2019-08-02 MED ORDER — KETOROLAC TROMETHAMINE 30 MG/ML IJ SOLN
INTRAMUSCULAR | Status: AC
  Filled 2019-08-02: qty 1

## 2019-08-02 MED ORDER — FENTANYL CITRATE (PF) 100 MCG/2ML IJ SOLN
25.0000 ug | INTRAMUSCULAR | Status: DC | PRN
  Administered 2019-08-02: 50 ug via INTRAVENOUS

## 2019-08-02 MED ORDER — DEXAMETHASONE SODIUM PHOSPHATE 10 MG/ML IJ SOLN
INTRAMUSCULAR | Status: DC | PRN
  Administered 2019-08-02: 5 mg via INTRAVENOUS

## 2019-08-02 MED ORDER — PROMETHAZINE HCL 25 MG/ML IJ SOLN
6.2500 mg | INTRAMUSCULAR | Status: DC | PRN
  Administered 2019-08-02: 6.25 mg via INTRAVENOUS

## 2019-08-02 MED ORDER — PHENYLEPHRINE 40 MCG/ML (10ML) SYRINGE FOR IV PUSH (FOR BLOOD PRESSURE SUPPORT)
PREFILLED_SYRINGE | INTRAVENOUS | Status: AC
  Filled 2019-08-02: qty 10

## 2019-08-02 MED ORDER — ROCURONIUM BROMIDE 100 MG/10ML IV SOLN
INTRAVENOUS | Status: DC | PRN
  Administered 2019-08-02: 70 mg via INTRAVENOUS
  Administered 2019-08-02: 20 mg via INTRAVENOUS
  Administered 2019-08-02 (×4): 10 mg via INTRAVENOUS

## 2019-08-02 MED ORDER — FENTANYL CITRATE (PF) 100 MCG/2ML IJ SOLN
INTRAMUSCULAR | Status: AC
  Filled 2019-08-02: qty 2

## 2019-08-02 MED ORDER — CHLORHEXIDINE GLUCONATE CLOTH 2 % EX PADS: 6.0000 | MEDICATED_PAD | Freq: Once | CUTANEOUS | Status: DC

## 2019-08-02 MED ORDER — KETOROLAC TROMETHAMINE 30 MG/ML IJ SOLN
30.0000 mg | Freq: Once | INTRAMUSCULAR | Status: AC | PRN
  Administered 2019-08-02: 30 mg via INTRAVENOUS

## 2019-08-02 MED ORDER — LIDOCAINE 2% (20 MG/ML) 5 ML SYRINGE
INTRAMUSCULAR | Status: DC | PRN
  Administered 2019-08-02: 100 mg via INTRAVENOUS

## 2019-08-02 MED ORDER — MEPERIDINE HCL 25 MG/ML IJ SOLN
6.2500 mg | INTRAMUSCULAR | Status: DC | PRN
  Administered 2019-08-02 (×2): 12.5 mg via INTRAVENOUS

## 2019-08-02 MED ORDER — FENTANYL CITRATE (PF) 250 MCG/5ML IJ SOLN
INTRAMUSCULAR | Status: DC | PRN
  Administered 2019-08-02 (×4): 50 ug via INTRAVENOUS
  Administered 2019-08-02: 25 ug via INTRAVENOUS
  Administered 2019-08-02: 100 ug via INTRAVENOUS

## 2019-08-02 MED ORDER — PROPOFOL 10 MG/ML IV BOLUS
INTRAVENOUS | Status: DC | PRN
  Administered 2019-08-02: 200 mg via INTRAVENOUS

## 2019-08-02 MED ORDER — HYDROCODONE-ACETAMINOPHEN 5-325 MG PO TABS
1.0000 | ORAL_TABLET | Freq: Once | ORAL | Status: AC
  Administered 2019-08-02: 1 via ORAL

## 2019-08-02 MED ORDER — ACETAMINOPHEN 160 MG/5ML PO SOLN: 325.0000 mg | ORAL | Status: DC | PRN

## 2019-08-02 MED ORDER — ACETAMINOPHEN 500 MG PO TABS
1000.0000 mg | ORAL_TABLET | ORAL | Status: AC
  Administered 2019-08-02: 1000 mg via ORAL
  Filled 2019-08-02: qty 2

## 2019-08-02 MED ORDER — SODIUM CHLORIDE 0.9 % IV SOLN
Freq: Once | INTRAVENOUS | Status: AC
  Administered 2019-08-02: 1000 mL
  Filled 2019-08-02: qty 1

## 2019-08-02 MED ORDER — ONDANSETRON HCL 4 MG/2ML IJ SOLN
INTRAMUSCULAR | Status: AC
  Filled 2019-08-02: qty 2

## 2019-08-02 MED ORDER — 0.9 % SODIUM CHLORIDE (POUR BTL) OPTIME
TOPICAL | Status: DC | PRN
  Administered 2019-08-02 (×2): 1000 mL

## 2019-08-02 MED ORDER — CELECOXIB 200 MG PO CAPS
200.0000 mg | ORAL_CAPSULE | ORAL | Status: AC
  Administered 2019-08-02: 200 mg via ORAL
  Filled 2019-08-02: qty 1

## 2019-08-02 MED ORDER — SUGAMMADEX SODIUM 200 MG/2ML IV SOLN
INTRAVENOUS | Status: DC | PRN
  Administered 2019-08-02: 300 mg via INTRAVENOUS

## 2019-08-02 MED ORDER — HYDROCODONE-ACETAMINOPHEN 5-325 MG PO TABS
ORAL_TABLET | ORAL | Status: AC
  Filled 2019-08-02: qty 1

## 2019-08-02 MED ORDER — ONDANSETRON HCL 4 MG/2ML IJ SOLN
INTRAMUSCULAR | Status: DC | PRN
  Administered 2019-08-02: 4 mg via INTRAVENOUS

## 2019-08-02 MED ORDER — SODIUM BICARBONATE 4 % IV SOLN
INTRAVENOUS | Status: AC
  Administered 2019-08-02: 1000 mL via INTRAMUSCULAR
  Filled 2019-08-02: qty 30

## 2019-08-02 MED ORDER — PHENYLEPHRINE 40 MCG/ML (10ML) SYRINGE FOR IV PUSH (FOR BLOOD PRESSURE SUPPORT)
PREFILLED_SYRINGE | INTRAVENOUS | Status: DC | PRN
  Administered 2019-08-02 (×2): 80 ug via INTRAVENOUS

## 2019-08-02 MED ORDER — ACETAMINOPHEN 325 MG PO TABS: 325.0000 mg | ORAL_TABLET | ORAL | Status: DC | PRN

## 2019-08-02 MED ORDER — MIDAZOLAM HCL 5 MG/5ML IJ SOLN
INTRAMUSCULAR | Status: DC | PRN
  Administered 2019-08-02: 2 mg via INTRAVENOUS

## 2019-08-02 MED ORDER — MEPERIDINE HCL 25 MG/ML IJ SOLN
INTRAMUSCULAR | Status: AC
  Filled 2019-08-02: qty 1

## 2019-08-02 SURGICAL SUPPLY — 63 items
BLADE SURG 10 STRL SS (BLADE) ×12 IMPLANT
BLADE SURG 11 STRL SS (BLADE) ×6 IMPLANT
BLADE SURG 15 STRL LF DISP TIS (BLADE) ×4 IMPLANT
BLADE SURG 15 STRL SS (BLADE) ×2
CANISTER LIPO FAT HARVEST (MISCELLANEOUS) ×6 IMPLANT
CANISTER SUCT 1200ML W/VALVE (MISCELLANEOUS) ×6 IMPLANT
CHLORAPREP W/TINT 26 (MISCELLANEOUS) ×12 IMPLANT
COVER MAYO STAND STRL (DRAPES) ×6 IMPLANT
DECANTER SPIKE VIAL GLASS SM (MISCELLANEOUS) ×6 IMPLANT
DERMABOND ADVANCED (GAUZE/BANDAGES/DRESSINGS) ×4
DERMABOND ADVANCED .7 DNX12 (GAUZE/BANDAGES/DRESSINGS) ×8 IMPLANT
DRAPE HALF SHEET 40X57 (DRAPES) ×12 IMPLANT
DRAPE ORTHO SPLIT 77X108 STRL (DRAPES) ×4
DRAPE SURG ORHT 6 SPLT 77X108 (DRAPES) ×8 IMPLANT
DRSG PAD ABDOMINAL 8X10 ST (GAUZE/BANDAGES/DRESSINGS) ×36 IMPLANT
ELECT COATED BLADE 2.86 ST (ELECTRODE) ×6 IMPLANT
ELECT REM PT RETURN 9FT ADLT (ELECTROSURGICAL) ×6
ELECTRODE REM PT RTRN 9FT ADLT (ELECTROSURGICAL) ×4 IMPLANT
FILTER LIPOSUCTION (MISCELLANEOUS) ×6 IMPLANT
GLOVE BIO SURGEON STRL SZ 6 (GLOVE) ×24 IMPLANT
GLOVE BIO SURGEON STRL SZ7 (GLOVE) ×6 IMPLANT
GLOVE BIOGEL PI IND STRL 6.5 (GLOVE) ×4 IMPLANT
GLOVE BIOGEL PI IND STRL 7.0 (GLOVE) ×8 IMPLANT
GLOVE BIOGEL PI IND STRL 7.5 (GLOVE) ×4 IMPLANT
GLOVE BIOGEL PI INDICATOR 6.5 (GLOVE) ×2
GLOVE BIOGEL PI INDICATOR 7.0 (GLOVE) ×4
GLOVE BIOGEL PI INDICATOR 7.5 (GLOVE) ×2
GLOVE ECLIPSE 7.0 STRL STRAW (GLOVE) ×6 IMPLANT
GOWN STRL REUS W/ TWL LRG LVL3 (GOWN DISPOSABLE) ×12 IMPLANT
GOWN STRL REUS W/TWL LRG LVL3 (GOWN DISPOSABLE) ×6
IMPL BREAST GEL XFULL SHL 650 (Breast) ×8 IMPLANT
IMPL BRST GEL XFULL SHL 650CC (Breast) ×8 IMPLANT
IMPLANT BREAST GEL 650CC (Breast) ×4 IMPLANT
KIT BASIN OR (CUSTOM PROCEDURE TRAY) ×6 IMPLANT
KIT TURNOVER KIT B (KITS) ×6 IMPLANT
LINER CANISTER 1000CC FLEX (MISCELLANEOUS) ×6 IMPLANT
MARKER SKIN DUAL TIP RULER LAB (MISCELLANEOUS) ×6 IMPLANT
NEEDLE FILTER BLUNT 18X 1/2SAF (NEEDLE) ×2
NEEDLE FILTER BLUNT 18X1 1/2 (NEEDLE) ×4 IMPLANT
NS IRRIG 1000ML POUR BTL (IV SOLUTION) ×12 IMPLANT
PACK GENERAL/GYN (CUSTOM PROCEDURE TRAY) ×6 IMPLANT
SIZER BREAST REUSE XFP 650CC (SIZER) ×6
SIZER BRST REUSE XFP 650CC (SIZER) ×4 IMPLANT
SOLUTION BETADINE 4OZ (MISCELLANEOUS) ×12 IMPLANT
SPONGE LAP 18X18 RF (DISPOSABLE) ×6 IMPLANT
STAPLER VISISTAT 35W (STAPLE) ×6 IMPLANT
SUT MNCRL AB 4-0 PS2 18 (SUTURE) ×18 IMPLANT
SUT PDS AB 2-0 CT2 27 (SUTURE) ×6 IMPLANT
SUT VIC AB 3-0 PS1 18 (SUTURE)
SUT VIC AB 3-0 PS1 18XBRD (SUTURE) IMPLANT
SUT VIC AB 3-0 SH 27 (SUTURE) ×4
SUT VIC AB 3-0 SH 27X BRD (SUTURE) ×8 IMPLANT
SUT VIC AB 4-0 PS2 18 (SUTURE) ×6 IMPLANT
SUT VICRYL 4-0 PS2 18IN ABS (SUTURE) ×12 IMPLANT
SYR 50ML LL SCALE MARK (SYRINGE) ×36 IMPLANT
SYR BULB IRRIGATION 50ML (SYRINGE) ×6 IMPLANT
SYR CONTROL 10ML LL (SYRINGE) ×30 IMPLANT
TOWEL GREEN STERILE FF (TOWEL DISPOSABLE) ×12 IMPLANT
TUBE CONNECTING 12'X1/4 (SUCTIONS) ×1
TUBE CONNECTING 12X1/4 (SUCTIONS) ×5 IMPLANT
TUBING INFILTRATION IT-10001 (TUBING) ×6 IMPLANT
TUBING SET GRADUATE ASPIR 12FT (MISCELLANEOUS) ×6 IMPLANT
UNDERPAD 30X30 (UNDERPADS AND DIAPERS) ×12 IMPLANT

## 2019-08-02 NOTE — Transfer of Care (Signed)
Immediate Anesthesia Transfer of Care Note  Patient: Vaneta Barbe Maness Dewald  Procedure(s) Performed: REMOVAL OF BILATERAL TISSUE EXPANDERS WITH PLACEMENT OF BILATERAL BREAST IMPLANTS (Bilateral Breast) LIPOFILLING bilateral chest (Bilateral Chest) REMOVAL PORT-A-CATH (Right Chest) Biopsy Skin (Left Chest)  Patient Location: PACU  Anesthesia Type:General  Level of Consciousness: drowsy  Airway & Oxygen Therapy: Patient Spontanous Breathing and Patient connected to face mask oxygen  Post-op Assessment: Report given to RN and Post -op Vital signs reviewed and stable  Post vital signs: Reviewed  Last Vitals:  Vitals Value Taken Time  BP 154/84 08/02/19 1041  Temp    Pulse 82 08/02/19 1044  Resp 7 08/02/19 1044  SpO2 96 % 08/02/19 1044  Vitals shown include unvalidated device data.  Last Pain:  Vitals:   08/02/19 0628  TempSrc:   PainSc: 0-No pain      Patients Stated Pain Goal: 4 ([id number])  Complications: No apparent anesthesia complications

## 2019-08-02 NOTE — Anesthesia Preprocedure Evaluation (Signed)
Anesthesia Evaluation  Patient identified by MRN, date of birth, ID band Patient awake    Reviewed: Allergy & Precautions, NPO status , Patient's Chart, lab work & pertinent test results  History of Anesthesia Complications Negative for: history of anesthetic complications  Airway Mallampati: I  TM Distance: >3 FB Neck ROM: Full    Dental no notable dental hx. (+) Teeth Intact   Pulmonary sleep apnea , former smoker,    Pulmonary exam normal        Cardiovascular negative cardio ROS Normal cardiovascular exam Rate:Normal     Neuro/Psych  Headaches, Anxiety    GI/Hepatic Neg liver ROS, GERD  Medicated and Controlled,  Endo/Other  Hypothyroidism   Renal/GU negative Renal ROS  negative genitourinary   Musculoskeletal negative musculoskeletal ROS (+)   Abdominal (+) + obese,   Peds  Hematology   Anesthesia Other Findings Day of surgery medications reviewed with patient.  Reproductive/Obstetrics negative OB ROS                             Anesthesia Physical  Anesthesia Plan  ASA: II  Anesthesia Plan: General   Post-op Pain Management:    Induction: Intravenous  PONV Risk Score and Plan: 4 or greater and Treatment may vary due to age or medical condition, Midazolam, Ondansetron and Dexamethasone  Airway Management Planned: Oral ETT  Additional Equipment: None  Intra-op Plan:   Post-operative Plan: Extubation in OR  Informed Consent: I have reviewed the patients History and Physical, chart, labs and discussed the procedure including the risks, benefits and alternatives for the proposed anesthesia with the patient or authorized representative who has indicated his/her understanding and acceptance.     Dental advisory given  Plan Discussed with: CRNA  Anesthesia Plan Comments:         Anesthesia Quick Evaluation

## 2019-08-02 NOTE — Interval H&P Note (Signed)
History and Physical Interval Note:  08/02/2019 6:59 AM  Natalie Burton  has presented today for surgery, with the diagnosis of hx of breast acquired absence of breast.  The various methods of treatment have been discussed with the patient and family. After consideration of risks, benefits and other options for treatment, the patient has consented to  Procedure(s): REMOVAL OF BILATERAL TISSUE EXPANDERS WITH PLACEMENT OF BILATERAL BREAST IMPLANTS (Bilateral) LIPOFILLING bilateral chest (Bilateral) possible,REMOVAL PORT-A-CATH (N/A) as a surgical intervention.  The patient's history has been reviewed, patient examined, no change in status, stable for surgery.  I have reviewed the patient's chart and labs.  Questions were answered to the patient's satisfaction.     Arnoldo Hooker Lyrique Hakim

## 2019-08-02 NOTE — Op Note (Signed)
Operative Note   DATE OF OPERATION: 4.2.21  LOCATION: [facility] Main OR-outpatient  SURGICAL DIVISION: Plastic Surgery  PREOPERATIVE DIAGNOSES:  1. History breast cancer 2. Acquired absence breasts  POSTOPERATIVE DIAGNOSES:  1. History breast cancer 2. Acquired absence breasts 3. Pigmented nevus left chest  PROCEDURE:  1. Removal right chest port 2. Removal bilateral tissue expanders and placement silicone implants 3. Lipofilling to bilateral chest 190 ml total 4. Excisional biopsy left chest nevus 0.5 cm  SURGEON: Irene Limbo MD MBA  ASSISTANT: none  ANESTHESIA:  General.   EBL: 123XX123 ml  COMPLICATIONS: None immediate.   INDICATIONS FOR PROCEDURE:  The patient, Natalie Burton, is a 54 y.o. female born on 02-17-1966, is here for staged breast reconstruction following bilateral prepectoral skin sparing expander acellular dermis reconstruction.    FINDINGS: Complete incorporation ADM noted bilateral chest. Natrelle Inspira Smooth Round Extra Projection 650 ml implants placed bilateral. REF SRX-650 RIGHT SN [id number] LEFT SN [id number] . Total 90 ml fat infiltrated over right chest, total 100 ml fat infiltrated over left chest. 0.2 cm darkly pigmented papular skin lesion left upper chest biopsied.  DESCRIPTION OF PROCEDURE:  The patient's operative site was marked with the patient in the preoperative areaincluding chest midline, anterior axillary lines. Bilateral lateral chest wall and supra and infraumbilical abdomen marked for liposuction donor site for lipofilling. The patientwas taken to the operating room. SCDs were placed and IV antibiotics were given. The patient's operative site was prepped and draped in a sterile fashion. A time out was performed and all information was confirmed to be correct.  Incision made over right chest port scar and carried through superficial fascia and capsule. Port removed intact. Closure completed with 4-0 vicryl in superficial fascia and dermis followed by  4-0 monocryl subcuticular skin closure.  Incision made in left mastectomy scar and carried through superficial fascia and acellular dermis. Expander removed. Capsulotomy performed superiorly. Sizer placed. I then directed attention right chest. Incision made in prior transverse chest scar and carried through superficial fascia and acellular dermis. Expander removed. Capsulotomy performed superiorly. Sizer placed. Patient brought to upright sitting position. 650 ml Extra Projection implants selected for bilateral placement.  Stab incision made over bilateral abdomen and bilateral lateral chest in prior drain site scars and tumescent infiltrated total 800 ml. Power assisted liposuction completed to endpoint symmetric soft tissue thickness, total lipoaspirate 600 ml. Fat then washed and prepared by gravity. Fat infiltrated in subcutaneous place over bilateral mastectomy flaps. Cavitiesirrigated with bacitracin, Ancef, gentamicinsolution.Hemostasis ensured.Cavitiesthen irrigated with Betadinesaline solution. The implant was placed inleftchest andimplant orientation ensured. Closure completed with 3-0 vicryl to closesuperficial fascia and ADMover implant.4-0 vicryl used to close dermis followed by 4-0 monocryl subcuticular.Implant placed inrightchest andimplant orientation ensured. Closure completed with 3-0 vicryl to closesuperficial fascia,4-0 vicryl used to close dermis followed by 4-0 monocryl subcuticular.Tissue adhesiveapplied tochestincisions. Liposuction port sites closed with simple 4-0 monocryl suture.   During procedure darkly pigmented nevus left upper chest noted and I elected to complete biopsy. Sharp excision lesion with margins diameter 0.5 cm completed. Simple closure with 4-0 monocryl completed.  Tissue adhesive applied to chest incisions. Dry dressing applied, followed by breastand abdominal binder applied. The patient was allowed to wake from anesthesia, extubated and  taken to the recovery room in satisfactory condition.   SPECIMENS: left chest nevus biopsy  DRAINS: none

## 2019-08-02 NOTE — Anesthesia Procedure Notes (Signed)
Procedure Name: Intubation Date/Time: 08/02/2019 7:34 AM Performed by: Janene Harvey, CRNA Pre-anesthesia Checklist: Patient identified, Emergency Drugs available, Suction available and Patient being monitored Patient Re-evaluated:Patient Re-evaluated prior to induction Oxygen Delivery Method: Circle system utilized Preoxygenation: Pre-oxygenation with 100% oxygen Induction Type: IV induction Ventilation: Mask ventilation without difficulty and Oral airway inserted - appropriate to patient size Laryngoscope Size: Mac and 4 Grade View: Grade II Tube type: Oral Tube size: 7.0 mm Number of attempts: 1 Airway Equipment and Method: Stylet and Oral airway Placement Confirmation: ETT inserted through vocal cords under direct vision,  positive ETCO2 and breath sounds checked- equal and bilateral Secured at: 22 cm Tube secured with: Tape Dental Injury: Teeth and Oropharynx as per pre-operative assessment

## 2019-08-05 ENCOUNTER — Encounter: Payer: Self-pay | Admitting: *Deleted

## 2019-08-05 LAB — SURGICAL PATHOLOGY

## 2019-08-07 NOTE — Anesthesia Postprocedure Evaluation (Signed)
Anesthesia Post Note  Patient: Natalie Burton  Procedure(s) Performed: REMOVAL OF BILATERAL TISSUE EXPANDERS WITH PLACEMENT OF BILATERAL BREAST IMPLANTS (Bilateral Breast) LIPOFILLING bilateral chest (Bilateral Chest) REMOVAL PORT-A-CATH (Right Chest) Biopsy Skin (Left Chest)     Patient location during evaluation: PACU Anesthesia Type: General Level of consciousness: sedated Pain management: pain level controlled Vital Signs Assessment: post-procedure vital signs reviewed and stable Respiratory status: spontaneous breathing Cardiovascular status: stable Postop Assessment: no apparent nausea or vomiting Anesthetic complications: no    Last Vitals:  Vitals:   08/02/19 1145 08/02/19 1156  BP:  139/78  Pulse:  94  Resp: (!) 51 14  Temp:  (!) 36.1 C  SpO2:  92%    Last Pain:  Vitals:   08/02/19 1156  TempSrc:   PainSc: 4    Pain Goal: Patients Stated Pain Goal: 4 (08/02/19 [id number])                 Huston Foley

## 2019-08-28 ENCOUNTER — Encounter: Payer: Self-pay | Admitting: Gastroenterology

## 2019-09-04 MED FILL — ESCITALOPRAM 10 MG TABLET: 10 | 90 days supply | Qty: 90 | Fill #0

## 2019-09-11 ENCOUNTER — Ambulatory Visit (AMBULATORY_SURGERY_CENTER): Payer: Self-pay

## 2019-09-11 ENCOUNTER — Other Ambulatory Visit: Payer: Self-pay

## 2019-09-11 VITALS — Temp 97.3°F | Ht 71.0 in | Wt 277.8 lb

## 2019-09-11 DIAGNOSIS — Z86010 Personal history of colonic polyps: Secondary | ICD-10-CM

## 2019-09-11 DIAGNOSIS — Z800 Family history of malignant neoplasm of digestive organs: Secondary | ICD-10-CM

## 2019-09-11 MED ORDER — CLENPIQ 10-3.5-12 MG-GM -GM/160ML PO SOLN: 1.0000 | Freq: Once | ORAL | 0 refills | Status: AC

## 2019-09-11 NOTE — Progress Notes (Signed)
No allergies to soy or egg Pt is not on blood thinners or diet pills Denies issues with sedation/intubation Denies atrial flutter/fib Denies constipation   Emmi instructions given to pt  Pt is aware of Covid safety and care partner requirements.   Pt is aware her insurance may not cover the cost of Clenpiq, but is willing to pay the difference.

## 2019-09-25 ENCOUNTER — Other Ambulatory Visit: Payer: Self-pay

## 2019-09-25 ENCOUNTER — Encounter: Payer: Self-pay | Admitting: Gastroenterology

## 2019-09-25 ENCOUNTER — Ambulatory Visit (AMBULATORY_SURGERY_CENTER): Payer: 59 | Admitting: Gastroenterology

## 2019-09-25 VITALS — BP 163/99 | HR 63 | Resp 15 | Ht 71.0 in | Wt 277.0 lb

## 2019-09-25 DIAGNOSIS — G4733 Obstructive sleep apnea (adult) (pediatric): Secondary | ICD-10-CM | POA: Diagnosis not present

## 2019-09-25 DIAGNOSIS — D122 Benign neoplasm of ascending colon: Secondary | ICD-10-CM

## 2019-09-25 DIAGNOSIS — Z86010 Personal history of colonic polyps: Secondary | ICD-10-CM

## 2019-09-25 DIAGNOSIS — Z1211 Encounter for screening for malignant neoplasm of colon: Secondary | ICD-10-CM | POA: Diagnosis not present

## 2019-09-25 DIAGNOSIS — K219 Gastro-esophageal reflux disease without esophagitis: Secondary | ICD-10-CM | POA: Diagnosis not present

## 2019-09-25 MED ORDER — SODIUM CHLORIDE 0.9 % IV SOLN: 500.0000 mL | INTRAVENOUS | Status: DC

## 2019-09-25 NOTE — Progress Notes (Signed)
Report to PACU, RN, vss, BBS= Clear.  

## 2019-09-25 NOTE — Progress Notes (Signed)
Called to room to assist during endoscopic procedure.  Patient ID and intended procedure confirmed with present staff. Received instructions for my participation in the procedure from the performing physician.  

## 2019-09-25 NOTE — Op Note (Signed)
[name] Patient Name: Natalie Burton Procedure Date: 09/25/2019 7:14 AM MRN: [id number] Endoscopist: Jackquline Denmark , MD Age: 54 Referring MD:  Date of Birth: 27-Oct-1965 Gender: Female Account #: [id number] Procedure:                Colonoscopy Indications:              Screening in patient at increased risk: FH colon                            cancer (mom at age 67). H/O colonic polyps. Medicines:                Monitored Anesthesia Care Procedure:                Pre-Anesthesia Assessment:                           - Prior to the procedure, a History and Physical                            was performed, and patient medications and                            allergies were reviewed. The patient's tolerance of                            previous anesthesia was also reviewed. The risks                            and benefits of the procedure and the sedation                            options and risks were discussed with the patient.                            All questions were answered, and informed consent                            was obtained. Prior Anticoagulants: The patient has                            taken no previous anticoagulant or antiplatelet                            agents. ASA Grade Assessment: II - A patient with                            mild systemic disease. After reviewing the risks                            and benefits, the patient was deemed in                            satisfactory condition to undergo the procedure.  After obtaining informed consent, the colonoscope                            was passed under direct vision. Throughout the                            procedure, the patient's blood pressure, pulse, and                            oxygen saturations were monitored continuously. The                            Colonoscope was introduced through the anus and                            advanced to the the  cecum, identified by                            appendiceal orifice and ileocecal valve. The                            colonoscopy was performed without difficulty. The                            patient tolerated the procedure well. The quality                            of the bowel preparation was good. The ileocecal                            valve, appendiceal orifice, and rectum were                            photographed. Scope In: 8:17:44 AM Scope Out: 8:33:26 AM Scope Withdrawal Time: 0 hours 10 minutes 25 seconds  Total Procedure Duration: 0 hours 15 minutes 42 seconds  Findings:                 A 6 mm polyp was found in the distal ascending                            colon. The polyp was sessile. The polyp was removed                            with a cold snare. Resection and retrieval were                            complete. Incidental 8 mm lipoma was also noted in                            the ascending colon.                           A few small-mouthed diverticula were found in the  sigmoid colon, descending colon and ascending colon.                           Non-bleeding internal hemorrhoids were found during                            retroflexion. The hemorrhoids were small.                           The exam was otherwise without abnormality on                            direct and retroflexion views. Complications:            No immediate complications. Estimated Blood Loss:     Estimated blood loss: none. Impression:               - One 6 mm polyp in the distal ascending colon,                            removed with a cold snare. Resected and retrieved.                           - Mild pancolonic diverticulosis.                           - Non-bleeding internal hemorrhoids.                           - The examination was otherwise normal on direct                            and retroflexion views. Recommendation:           - Patient  has a contact number available for                            emergencies. The signs and symptoms of potential                            delayed complications were discussed with the                            patient. Return to normal activities tomorrow.                            Written discharge instructions were provided to the                            patient.                           - Resume previous diet.                           - Continue present medications.                           -  Await pathology results.                           - Repeat colonoscopy for surveillance based on                            pathology results.                           - The findings and recommendations were discussed                            with the patient. Jackquline Denmark, MD 09/25/2019 8:44:47 AM This report has been signed electronically.

## 2019-09-25 NOTE — Progress Notes (Signed)
V/s Cw  I have reviewed the patient's medical history in detail and updated the computerized patient record. 

## 2019-09-25 NOTE — Patient Instructions (Signed)
Please read handouts provided. Continue present medications. Await pathology results.   YOU HAD AN ENDOSCOPIC PROCEDURE TODAY AT THE [facility]:   Refer to the procedure report that was given to you for any specific questions about what was found during the examination.  If the procedure report does not answer your questions, please call your gastroenterologist to clarify.  If you requested that your care partner not be given the details of your procedure findings, then the procedure report has been included in a sealed envelope for you to review at your convenience later.  YOU SHOULD EXPECT: Some feelings of bloating in the abdomen. Passage of more gas than usual.  Walking can help get rid of the air that was put into your GI tract during the procedure and reduce the bloating. If you had a lower endoscopy (such as a colonoscopy or flexible sigmoidoscopy) you may notice spotting of blood in your stool or on the toilet paper. If you underwent a bowel prep for your procedure, you may not have a normal bowel movement for a few days.  Please Note:  You might notice some irritation and congestion in your nose or some drainage.  This is from the oxygen used during your procedure.  There is no need for concern and it should clear up in a day or so.  SYMPTOMS TO REPORT IMMEDIATELY:  Following lower endoscopy (colonoscopy or flexible sigmoidoscopy):  Excessive amounts of blood in the stool  Significant tenderness or worsening of abdominal pains  Swelling of the abdomen that is new, acute  Fever of 100F or higher   For urgent or emergent issues, a gastroenterologist can be reached at any hour by calling [phone]. Do not use MyChart messaging for urgent concerns.    DIET:  We do recommend a small meal at first, but then you may proceed to your regular diet.  Drink plenty of fluids but you should avoid alcoholic beverages for 24 hours.  ACTIVITY:  You should plan to take it easy  for the rest of today and you should NOT DRIVE or use heavy machinery until tomorrow (because of the sedation medicines used during the test).    FOLLOW UP: Our staff will call the number listed on your records 48-72 hours following your procedure to check on you and address any questions or concerns that you may have regarding the information given to you following your procedure. If we do not reach you, we will leave a message.  We will attempt to reach you two times.  During this call, we will ask if you have developed any symptoms of COVID 19. If you develop any symptoms (ie: fever, flu-like symptoms, shortness of breath, cough etc.) before then, please call [phone].  If you test positive for Covid 19 in the 2 weeks post procedure, please call and report this information to us.    If any biopsies were taken you will be contacted by phone or by letter within the next 1-3 weeks.  Please call us at [phone] if you have not heard about the biopsies in 3 weeks.    SIGNATURES/CONFIDENTIALITY: You and/or your care partner have signed paperwork which will be entered into your electronic medical record.  These signatures attest to the fact that that the information above on your After Visit Summary has been reviewed and is understood.  Full responsibility of the confidentiality of this discharge information lies with you and/or your care-partner.  

## 2019-09-27 ENCOUNTER — Telehealth: Payer: Self-pay

## 2019-09-27 NOTE — Telephone Encounter (Signed)
Left message on follow up call. 

## 2019-09-27 NOTE — Telephone Encounter (Signed)
NO ANSWER, MESSAGE LEFT FOR PATIENT. 

## 2019-09-30 ENCOUNTER — Encounter: Payer: Self-pay | Admitting: Gastroenterology

## 2019-10-14 MED FILL — SYNTHROID 175 MCG TABLET: 175 | 90 days supply | Qty: 90 | Fill #0

## 2019-10-14 MED FILL — ESOMEPRAZOLE MAG DR 40 MG C: 40 | 90 days supply | Qty: 180 | Fill #1

## 2019-10-15 DIAGNOSIS — R945 Abnormal results of liver function studies: Secondary | ICD-10-CM | POA: Diagnosis not present

## 2019-10-15 DIAGNOSIS — C50511 Malignant neoplasm of lower-outer quadrant of right female breast: Secondary | ICD-10-CM | POA: Diagnosis not present

## 2019-10-15 DIAGNOSIS — D649 Anemia, unspecified: Secondary | ICD-10-CM | POA: Diagnosis not present

## 2019-10-15 MED FILL — LETROZOLE 2.5 MG TABS: 2.5 | 90 days supply | Qty: 90 | Fill #0

## 2019-11-22 ENCOUNTER — Other Ambulatory Visit: Payer: Self-pay | Admitting: Plastic Surgery

## 2019-11-22 DIAGNOSIS — N6320 Unspecified lump in the left breast, unspecified quadrant: Secondary | ICD-10-CM

## 2019-11-22 DIAGNOSIS — Z853 Personal history of malignant neoplasm of breast: Secondary | ICD-10-CM | POA: Diagnosis not present

## 2019-11-22 DIAGNOSIS — Z9013 Acquired absence of bilateral breasts and nipples: Secondary | ICD-10-CM | POA: Diagnosis not present

## 2019-12-04 MED FILL — ESCITALOPRAM 10 MG TABLET: 10 | 90 days supply | Qty: 90 | Fill #0

## 2019-12-06 ENCOUNTER — Other Ambulatory Visit: Payer: Self-pay | Admitting: Plastic Surgery

## 2019-12-06 ENCOUNTER — Other Ambulatory Visit: Payer: Self-pay

## 2019-12-06 ENCOUNTER — Ambulatory Visit
Admission: RE | Admit: 2019-12-06 | Discharge: 2019-12-06 | Disposition: A | Discharge status: Home / Self Care | Payer: 59 | Source: Ambulatory Visit | Attending: Plastic Surgery | Admitting: Plastic Surgery

## 2019-12-06 DIAGNOSIS — N6320 Unspecified lump in the left breast, unspecified quadrant: Secondary | ICD-10-CM

## 2019-12-06 DIAGNOSIS — N6321 Unspecified lump in the left breast, upper outer quadrant: Secondary | ICD-10-CM | POA: Diagnosis not present

## 2019-12-06 HISTORY — DX: Personal history of antineoplastic chemotherapy: Z92.21

## 2019-12-17 DIAGNOSIS — Z20822 Contact with and (suspected) exposure to covid-19: Secondary | ICD-10-CM | POA: Diagnosis not present

## 2019-12-17 DIAGNOSIS — R05 Cough: Secondary | ICD-10-CM | POA: Diagnosis not present

## 2020-01-07 MED FILL — SYNTHROID 175 MCG TABLET: 175 | 90 days supply | Qty: 90 | Fill #1

## 2020-01-07 MED FILL — EXEMESTANE 25 MG TABLET: 25 | 90 days supply | Qty: 90 | Fill #0

## 2020-01-15 DIAGNOSIS — C50511 Malignant neoplasm of lower-outer quadrant of right female breast: Secondary | ICD-10-CM | POA: Diagnosis not present

## 2020-01-15 DIAGNOSIS — R945 Abnormal results of liver function studies: Secondary | ICD-10-CM | POA: Diagnosis not present

## 2020-01-23 MED FILL — ESOMEPRAZOLE MAG DR 40 MG C: 40 | 90 days supply | Qty: 180 | Fill #0

## 2020-01-27 ENCOUNTER — Ambulatory Visit (INDEPENDENT_AMBULATORY_CARE_PROVIDER_SITE_OTHER): Payer: 59

## 2020-01-27 DIAGNOSIS — Z23 Encounter for immunization: Secondary | ICD-10-CM | POA: Diagnosis not present

## 2020-01-27 NOTE — Progress Notes (Signed)
   Covid-19 Vaccination Clinic  Name:  Natalie Burton    MRN: [id number] DOB: Jul 22, 1965  01/27/2020  Natalie Burton was observed post Covid-19 immunization for 15 minutes without incident. She was provided with Vaccine Information Sheet and instruction to access the V-Safe system.   Natalie Burton was instructed to call 911 with any severe reactions post vaccine: Marland Kitchen Difficulty breathing  . Swelling of face and throat  . A fast heartbeat  . A bad rash all over body  . Dizziness and weakness

## 2020-03-05 ENCOUNTER — Other Ambulatory Visit (HOSPITAL_COMMUNITY): Payer: Self-pay | Admitting: Specialist

## 2020-03-05 MED FILL — ESCITALOPRAM 10 MG TABLET: 10 | 90 days supply | Qty: 90 | Fill #0

## 2020-03-10 DIAGNOSIS — M2550 Pain in unspecified joint: Secondary | ICD-10-CM | POA: Insufficient documentation

## 2020-03-10 DIAGNOSIS — N390 Urinary tract infection, site not specified: Secondary | ICD-10-CM | POA: Diagnosis not present

## 2020-03-10 DIAGNOSIS — E039 Hypothyroidism, unspecified: Secondary | ICD-10-CM | POA: Diagnosis not present

## 2020-03-10 DIAGNOSIS — R5383 Other fatigue: Secondary | ICD-10-CM | POA: Diagnosis not present

## 2020-03-10 DIAGNOSIS — R5381 Other malaise: Secondary | ICD-10-CM | POA: Diagnosis not present

## 2020-04-01 ENCOUNTER — Other Ambulatory Visit: Payer: Self-pay | Admitting: Hematology and Oncology

## 2020-04-01 DIAGNOSIS — C50511 Malignant neoplasm of lower-outer quadrant of right female breast: Secondary | ICD-10-CM

## 2020-04-01 DIAGNOSIS — Z170 Estrogen receptor positive status [ER+]: Secondary | ICD-10-CM

## 2020-04-13 ENCOUNTER — Other Ambulatory Visit (HOSPITAL_COMMUNITY): Payer: Self-pay | Admitting: Specialist

## 2020-04-13 MED FILL — SYNTHROID 175 MCG TABLET: 175 | 90 days supply | Qty: 90 | Fill #0

## 2020-04-13 MED FILL — CELECOXIB 200 MG CAP: 200 | 90 days supply | Qty: 90 | Fill #0

## 2020-04-13 MED FILL — ESOMEPRAZOLE MAG DR 40 MG C: 40 | 90 days supply | Qty: 180 | Fill #0

## 2020-04-14 NOTE — Progress Notes (Signed)
[facility]  [address] [place],  [place]  [address] [phone]  Clinic Day:  04/14/2020  Referring physician: No ref. provider found   CHIEF COMPLAINT:  CC: Follow-up of breast cancer  Current Treatment: Exemestane  HISTORY OF PRESENT ILLNESS:  Natalie Burton is a 54 y.o. 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  She felt a lump in her right breast and underwent a diagnostic mammogram.  This revealed an irregular 3 cm mass in the right breast 2 cm from the nipple at 7 o'clock with associated with pleomorphic microcalcifications.  An ultrasound confirmed a 3.2 cm mass in the 7 o'clock position of the right breast with imaging features highly suspicious for malignancy.  The axilla appeared negative.  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  Estrogen and progesterone receptors were positive and HER2 negative.  Ki67 was 40%.  She had a previous history of atypical ductal hyperplasia.  Due to her personal and family history of malignancy, she underwent testing for hereditary cancer syndrome with the [employer] Common Hereditary Cancer panel, which did not did not reveal any clinically significant mutation or variants of uncertain significance.  Bone density scan in September 2020 was normal.  She opted for bilateral mastectomies, due to the high-grade histology, her highly dense breasts, and previous atypical ductal hyperplasia.  She saw Dr. Brantley Stage and Dr. Iran Planas.  She underwent bilateral mastectomies in September 2020 with placement of saline expanders in preparation for reconstruction.  Pathology revealed a 3.9 cm, grade 3, invasive ductal carcinoma of the right breast.  4 lymph nodes were negative for metastasis.  Margins were clear.  Left breast pathology was negative.  EndoPredict revealed an EP Clin score of 4.7,  which is in the high risk category, and correlates with a 33% risk of distant recurrence within 10 years when treated with 5 years of adjuvant hormonal therapy alone, with an estimated 16% absolute benefit from chemotherapy.  She is also at high risk for late recurrence, so we recommend 10 years of adjuvant hormonal therapy.  She received adjuvant chemotherapy with Taxotere/Adriamycin/Cytoxan (TAC) every 3 weeks for total of 6 cycles.  Echocardiogram prior to chemotherapy revealed normal left ventricular size and function with an ejection fraction of 60-65%.  She started TAC in November 2020.  She had fatigue, constipation, and acid reflux, followed later by diarrhea.  Prior to her 3rd cycle of chemotherapy, she was felt to have mild cellulitis of the right mastectomy site, so was placed on cephalexin.  She then developed fevers, despite being on cephalexin.  Urine culture was negative.  Due to the persistent fever, we changed her antibiotic to levofloxacin.  COVID-19 and flu testing were negative.  The fevers slowly resolved.    We proceeded with a 4th cycle of TAC chemotherapy in January.  She had mild elevation of the liver transaminases at that time and hepatitis panel was negative.  She developed recurrent fever for 3 days up to 101.9 and postnasal drip and a mild nonproductive cough.  There was not really a reduction in her fever with either of the antibiotics previously.  She was using using Tylenol and ibuprofen as needed.  Urine and blood cultures were negative.  The liver transaminases had normalized.  She developed fever around day 10, which was the same timing as after her 3rd cycle of chemotherapy.  The fevers were ultimately treated to docetaxel.  In January, she developed worsening cough and chest tightness, so was given a course of azithromycin.  She also had progressive anemia, felt to be due to treatment.  She completed 6 cycles of adjuvant TAC chemotherapy in February.  She has had a  hysterectomy for benign reasons, but her ovaries are intact.  She experienced menopausal symptoms about 2 years ago and hormone levels are consistent with post menopausal status.  She was placed on anastrozole 1 mg daily in March.  She developed severe muscle and joint pain with anastrozole, which improved off of anastrozole.  Her hormonal therapy was switched to letrozole 2.5 mg daily.  She did not tolerate tramadol either, so she was placed on exemestane.  She has tolerated simvastatin without significant difficulty. She had a left breast ultrasound done at the [facility] in mid August due to nodules of the left breast.  This was felt to be represent necrotic tissue from her reconstruction.  Repeat examination was scheduled in 6 months.  INTERVAL HISTORY:  [name] is here today for routine follow up of her stage II hormone receptor positive breast cancer.  Unfortunately, she did not tolerate exemestane due to severe fatigue and joint pain.  Her symptoms improved with discontinuation of the medication about 6 weeks ago.  She is currently on Celebrex for joint pain, but has not tried taking the exemestane again. She feels a new nodule in the right reconstruction and is seeing her plastic surgeon tomorrow.  She is also scheduled for mammogram and u/s next month.  She denies fevers or chills. She denies pain. Her appetite is good. Her weight has increased 2 pounds over last 3 months.  REVIEW OF SYSTEMS:  Review of Systems  Constitutional: Negative for appetite change, chills, fatigue, fever and unexpected weight change.  HENT:   Negative for lump/mass, mouth sores and sore throat.   Respiratory: Negative for cough and shortness of breath.   Cardiovascular: Negative for chest pain and leg swelling.  Gastrointestinal: Negative for abdominal pain, constipation, diarrhea, nausea and vomiting.  Endocrine: Negative for hot flashes.  Genitourinary: Negative for difficulty urinating, dysuria,  frequency, hematuria, vaginal bleeding and vaginal discharge.   Musculoskeletal: Positive for arthralgias. Negative for back pain and myalgias.  Skin: Negative for rash.  Neurological: Negative for dizziness, extremity weakness and headaches.  Psychiatric/Behavioral: Negative for depression. The patient is not nervous/anxious.      VITALS:  There were no vitals taken for this visit.  Wt Readings from Last 3 Encounters:  09/25/19 277 lb (125.6 kg)  09/11/19 277 lb 12.8 oz (126 kg)  08/02/19 275 lb 9.2 oz (125 kg)    There is no height or weight on file to calculate BMI.  Performance status (ECOG): 0 - Asymptomatic  PHYSICAL EXAM:  Physical Exam Vitals and nursing note reviewed.  Constitutional:      General: She is not in acute distress.    Appearance: Normal appearance.  HENT:     Mouth/Throat:     Mouth: Mucous membranes are moist.     Pharynx: Oropharynx is clear. No oropharyngeal exudate or posterior oropharyngeal erythema.  Eyes:     General: No scleral icterus.    Extraocular Movements: Extraocular movements intact.     Conjunctiva/sclera: Conjunctivae normal.  Cardiovascular:     Rate and Rhythm: Normal rate and regular rhythm.     Heart sounds: Normal heart sounds. No murmur heard. No friction rub. No gallop.  Pulmonary:     Effort: No respiratory distress.     Breath sounds: Normal breath sounds. No stridor. No wheezing, rhonchi or rales.  Chest:  Breasts:     Right: No axillary adenopathy or supraclavicular adenopathy.     Left: No axillary adenopathy or supraclavicular adenopathy.      Comments: There is a new 1 cm nodule in the right upper outer reconstruction and 2 stable 1 cm nodules in the left upper outer reconstruction. Abdominal:     General: There is no distension.     Palpations: Abdomen is soft. There is no hepatomegaly, splenomegaly or mass.     Tenderness: There is no abdominal tenderness. There is no guarding.     Hernia: No hernia is present.   Musculoskeletal:     Cervical back: Neck supple. No tenderness.     Right lower leg: No edema.     Left lower leg: No edema.  Lymphadenopathy:     Cervical: No cervical adenopathy.     Upper Body:     Right upper body: No supraclavicular or axillary adenopathy.     Left upper body: No supraclavicular or axillary adenopathy.     Lower Body: No right inguinal adenopathy. No left inguinal adenopathy.  Skin:    General: Skin is warm.     Coloration: Skin is not jaundiced.     Findings: No rash.  Neurological:     General: No focal deficit present.     Mental Status: She is alert and oriented to person, place, and time.     Cranial Nerves: No cranial nerve deficit.  Psychiatric:        Mood and Affect: Mood normal.        Behavior: Behavior normal.    Lymph nodes:   There is no cervical, clavicular, axillary or inguinal lymphadenopathy.   LABS:   CBC Latest Ref Rng & Units 07/30/2019 03/12/2019 01/18/2019  WBC 4.0 - 10.5 K/uL 6.9 4.8 6.8  Hemoglobin 12.0 - 15.0 g/dL 12.2 12.8 14.2  Hematocrit 36.0 - 46.0 % 38.1 40.0 43.3  Platelets 150 - 400 K/uL 304 104(L) 245   CMP Latest Ref Rng & Units 07/30/2019 03/12/2019 01/18/2019  Glucose 70 - 99 mg/dL 118(H) 108(H) 99  BUN 6 - 20 mg/dL _0 Creatinine 0.44 - 1.00 mg/dL 0.78 0.75 0.78  Sodium 135 - 145 mmol/L 140 139 137  Potassium 3.5 - 5.1 mmol/L 3.8 3.9 3.9  Chloride 98 - 111 mmol/L 103 104 103  CO2 22 - 32 mmol/L _1 Calcium 8.9 - 10.3 mg/dL 9.2 9.2 9.6  Total Protein 6.5 - 8.1 g/dL - 7.0 7.2  Total Bilirubin 0.3 - 1.2 mg/dL - 0.3 0.5  Alkaline Phos 38 - 126 U/L - 87 96  AST 15 - 41 U/L - 19 21  ALT 0 - 44 U/L - 30 40     No results found for: CEA1 / No results found for: CEA1 No results found for: PSA1 No results found for: FXJ883 No results found for: CAN125  No results found for: TOTALPROTELP, ALBUMINELP, A1GS, A2GS, BETS, BETA2SER, GAMS, MSPIKE, SPEI No results found for: TIBC, FERRITIN, IRONPCTSAT No  results found for: LDH  STUDIES:  No results found.    HISTORY:   Past Medical History:  Diagnosis Date  . Acid reflux   . Anemia   . Anxiety   . Breast cancer ([facility]) 10/2018   DCIS right breast. Finished chemo 2/29/21  .  Breast mass, right 02/27/2013   Excision 03/18/13. Papilloma with focal ADH   . Cancer ([facility])    breast  . Headache    migraines  . Hypothyroid   . IBS (irritable bowel syndrome)    both IBS-C and IBS-D  . Personal history of chemotherapy    Finished 2/29/21  . Sleep apnea    does not use a cpap    Past Surgical History:  Procedure Laterality Date  . ABDOMINAL HYSTERECTOMY  2008   vag  . AUGMENTATION MAMMAPLASTY Bilateral 08/02/2019   with fat grafting  . BREAST EXCISIONAL BIOPSY Right 2014  . BREAST LUMPECTOMY WITH NEEDLE LOCALIZATION Right 03/18/2013   Procedure: BREAST LUMPECTOMY WITH NEEDLE LOCALIZATION;  Surgeon: Haywood Lasso, MD;  Location: [facility];  Service: General;  Laterality: Right;  . BREAST RECONSTRUCTION WITH PLACEMENT OF TISSUE EXPANDER AND ALLODERM Bilateral 01/22/2019   Procedure: BILATERAL BREAST RECONSTRUCTION WITH PLACEMENT OF TISSUE EXPANDER AND ALLODERM;  Surgeon: Irene Limbo, MD;  Location: [facility];  Service: Plastics;  Laterality: Bilateral;  . CHOLECYSTECTOMY  1991   lap choli  . COLONOSCOPY  08/24/2016   Colonic polyp status post polypectomy. Small internal hemorrhoids.  Marland Kitchen LIPOSUCTION WITH LIPOFILLING Bilateral 08/02/2019   Procedure: LIPOFILLING bilateral chest;  Surgeon: Irene Limbo, MD;  Location: [facility];  Service: Plastics;  Laterality: Bilateral;  . MASTECTOMY Bilateral 01/2019  . MASTECTOMY W/ SENTINEL NODE BIOPSY Bilateral 01/22/2019   Procedure: BILATERAL SIMPLE MASTECTOMY WITH RIGHT SENTINEL LYMPH NODE MAPPING;  Surgeon: Erroll Luna, MD;  Location: [facility];  Service: General;  Laterality: Bilateral;  RNFA REQUESTED  . PORT-A-CATH REMOVAL Right 08/02/2019   Procedure: REMOVAL PORT-A-CATH;   Surgeon: Irene Limbo, MD;  Location: [facility];  Service: Plastics;  Laterality: Right;  . PORTACATH PLACEMENT N/A 03/12/2019   Procedure: INSERTION PORT-A-CATH WITH ULTRASOUND;  Surgeon: Erroll Luna, MD;  Location: [facility];  Service: General;  Laterality: N/A;  . REMOVAL OF BILATERAL TISSUE EXPANDERS WITH PLACEMENT OF BILATERAL BREAST IMPLANTS Bilateral 08/02/2019   Procedure: REMOVAL OF BILATERAL TISSUE EXPANDERS WITH PLACEMENT OF BILATERAL BREAST IMPLANTS;  Surgeon: Irene Limbo, MD;  Location: [facility];  Service: Plastics;  Laterality: Bilateral;  . SKIN BIOPSY Left 08/02/2019   Procedure: Biopsy Skin;  Surgeon: Irene Limbo, MD;  Location: [facility];  Service: Plastics;  Laterality: Left;  . TUBAL LIGATION  1998    Family History  Problem Relation Age of Onset  . Cancer Mother        colon, breast  . Breast cancer Mother   . Colon cancer Mother 36  . Colon polyps Mother 61  . Cancer Father        non hodgkins lymphoma  . Esophageal cancer Neg Hx   . Rectal cancer Neg Hx   . Stomach cancer Neg Hx     Social History:  reports that she quit smoking about 3 years ago. She smoked 0.25 packs per day. She has never used smokeless tobacco. She reports current alcohol use. She reports that she does not use drugs.The patient is alone today.  Allergies:  Allergies  Allergen Reactions  . Venlafaxine Shortness Of Breath  . Wellbutrin [Bupropion Hcl] Shortness Of Breath  . Codeine Nausea And Vomiting  . Morphine And Related Itching  . Zanaflex [Tizanidine Hydrochloride]     Slurred speech    Current Medications: Current Outpatient Medications  Medication Sig Dispense Refill  . cyclobenzaprine (FLEXERIL) 10 MG tablet Take 10 mg by mouth 3 (three)  times daily as needed for muscle spasms.    Marland Kitchen escitalopram (LEXAPRO) 10 MG tablet Take 10 mg by mouth daily.     Marland Kitchen esomeprazole (NEXIUM) 40 MG capsule Take 40 mg by mouth daily before breakfast.    . ibuprofen (ADVIL) 800 MG tablet Take  1 tablet (800 mg total) by mouth every 8 (eight) hours as needed. 30 tablet 0  . letrozole (FEMARA) 2.5 MG tablet Take 2.5 mg by mouth daily.    Marland Kitchen levothyroxine (SYNTHROID) 175 MCG tablet Take 175 mcg by mouth daily before breakfast.     . LINZESS 145 MCG CAPS capsule Take 145 mcg by mouth daily as needed for constipation.    . rizatriptan (MAXALT) 10 MG tablet Take 10 mg by mouth as needed for migraine. May repeat in 2 hours if needed     No current facility-administered medications for this visit.     ASSESSMENT & PLAN:   Assessment/Plan: 1. Stage IIA hormone receptor positive breast cancer, diagnosed in July 2020.  Due to a high EP Clin score, she received adjuvant TAC chemotherapy, and completed 6 cycles in February.  She has had a hysterectomy, but her ovaries are intact.  Bone density scan in September 2020 was normal.  FSH and estradiol were both well within the postmenopausal phase, so she was recommended for adjuvant hormonal therapy with an aromatase inhibitor.  She did not tolerate hormonal therapy with anastrozole or letrozole and now has had to discontinue exemestane due to side effects of  Severe fatigue and joint pain. Her only option for endocrine therapy at this time is tamoxifen.  We discussed some of the common side effects of tamoxifen such as hot flashes, nausea and mood changes, as well as a serious side effect of blood clots.  We also discussed the symptoms of peripheral deep venous thrombosis, pulmonary embolism and stroke.  The patient has had hysterectomy, so the increased risk of uterine cancer is not a factor.  She des sit quite a bit during the day due to her job but she is able to get up and move around as needed.  I advised her to get up and walk around every couple of hours to help avoid thromboses. 2. Abnormal liver transaminases, which have remain mildly elevated and have fluctuated up and down in the same range. 3. Two small nodules of the left  reconstruction felt  to be necrotic tissue from her reconstruction.  Repeat examination is scheduled in January. 4. New small nodule of the right reconstruction, lightly also due to be fat necrosis.  She is seeing her Psychiatric nurse tomorrow.  As above, I will place her on tamoxifen 20 mg daily and plan for at least a total of 5 years of therapy.  She will follow up with her plastic surgeon tomorrow regarding the new nodule in the right reconstruction, which is likely another area of fat necrosis.   We will plan to see her back in 3 months for reexamination. The patient understands the plans discussed today and is in agreement with them.    The patient knows to contact our office if she does not tolerate tamoxifen or develops other concerns prior to her next appointment.   I provided 20 minutes of face-to-face time during this this encounter and > 50% was spent counseling as documented under my assessment and plan.    Marvia Pickles, PA-C

## 2020-04-15 ENCOUNTER — Inpatient Hospital Stay: Payer: 59 | Attending: Hematology and Oncology

## 2020-04-15 ENCOUNTER — Other Ambulatory Visit: Payer: Self-pay

## 2020-04-15 ENCOUNTER — Inpatient Hospital Stay (INDEPENDENT_AMBULATORY_CARE_PROVIDER_SITE_OTHER): Payer: 59 | Admitting: Hematology and Oncology

## 2020-04-15 ENCOUNTER — Encounter: Payer: Self-pay | Admitting: Hematology and Oncology

## 2020-04-15 VITALS — BP 141/96 | HR 79 | Temp 98.2°F | Resp 18 | Ht 66.0 in | Wt 284.1 lb

## 2020-04-15 DIAGNOSIS — Z170 Estrogen receptor positive status [ER+]: Secondary | ICD-10-CM | POA: Diagnosis not present

## 2020-04-15 DIAGNOSIS — C50511 Malignant neoplasm of lower-outer quadrant of right female breast: Secondary | ICD-10-CM

## 2020-04-15 DIAGNOSIS — Z0001 Encounter for general adult medical examination with abnormal findings: Secondary | ICD-10-CM | POA: Diagnosis not present

## 2020-04-15 MED ORDER — TAMOXIFEN CITRATE 20 MG PO TABS: 20.0000 mg | ORAL_TABLET | Freq: Every day | ORAL | 2 refills | Status: DC

## 2020-04-16 ENCOUNTER — Other Ambulatory Visit: Payer: Self-pay | Admitting: Plastic Surgery

## 2020-04-16 DIAGNOSIS — C50511 Malignant neoplasm of lower-outer quadrant of right female breast: Secondary | ICD-10-CM | POA: Diagnosis not present

## 2020-04-16 DIAGNOSIS — N6310 Unspecified lump in the right breast, unspecified quadrant: Secondary | ICD-10-CM

## 2020-04-16 DIAGNOSIS — Z170 Estrogen receptor positive status [ER+]: Secondary | ICD-10-CM | POA: Diagnosis not present

## 2020-04-16 DIAGNOSIS — Z853 Personal history of malignant neoplasm of breast: Secondary | ICD-10-CM | POA: Diagnosis not present

## 2020-04-16 DIAGNOSIS — Z9013 Acquired absence of bilateral breasts and nipples: Secondary | ICD-10-CM | POA: Diagnosis not present

## 2020-05-19 ENCOUNTER — Other Ambulatory Visit: Payer: 59

## 2020-06-01 ENCOUNTER — Other Ambulatory Visit: Payer: 59

## 2020-06-05 ENCOUNTER — Other Ambulatory Visit: Payer: Self-pay | Admitting: Hematology and Oncology

## 2020-06-05 ENCOUNTER — Encounter: Payer: Self-pay | Admitting: Hematology and Oncology

## 2020-06-05 MED ORDER — TAMOXIFEN CITRATE 20 MG PO TABS: 20.0000 mg | ORAL_TABLET | Freq: Every day | ORAL | 3 refills | Status: DC

## 2020-06-05 MED FILL — TAMOXIFEN 20 MG TABLET: 20 | 90 days supply | Qty: 90 | Fill #0

## 2020-06-05 MED FILL — ESCITALOPRAM 10 MG TABLET: 10 | 90 days supply | Qty: 90 | Fill #0

## 2020-06-08 ENCOUNTER — Other Ambulatory Visit: Payer: Self-pay

## 2020-06-08 ENCOUNTER — Other Ambulatory Visit: Payer: 59

## 2020-06-08 ENCOUNTER — Ambulatory Visit
Admission: RE | Admit: 2020-06-08 | Discharge: 2020-06-08 | Disposition: A | Discharge status: Home / Self Care | Payer: 59 | Source: Ambulatory Visit | Attending: Plastic Surgery | Admitting: Plastic Surgery

## 2020-06-08 DIAGNOSIS — N6320 Unspecified lump in the left breast, unspecified quadrant: Secondary | ICD-10-CM

## 2020-06-08 DIAGNOSIS — N6310 Unspecified lump in the right breast, unspecified quadrant: Secondary | ICD-10-CM

## 2020-06-08 DIAGNOSIS — N6489 Other specified disorders of breast: Secondary | ICD-10-CM | POA: Diagnosis not present

## 2020-07-13 ENCOUNTER — Telehealth: Payer: Self-pay | Admitting: Oncology

## 2020-07-13 NOTE — Telephone Encounter (Signed)
Patient called to reschedule 3/15 Appt due to being sick.  Patient rescheduled to 3/28 at 11:30 am

## 2020-07-13 NOTE — Progress Notes (Incomplete)
[facility]  [address] [place],  [place]  [address] [phone]  Clinic Day:  07/13/2020  Referring physician: No ref. provider found  This document serves as a record of services personally performed by [name], MD. It was created on their behalf by Curry,Lauren E, a trained medical scribe. The creation of this record is based on the scribe's personal observations and the provider's statements to them.  CHIEF COMPLAINT:  CC: Stage IIA hormone receptor positive right breast cancer  Current Treatment: Tamoxifen 20 mg daily  HISTORY OF PRESENT ILLNESS:  Natalie Burton is a 55 y.o. 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  She felt a lump in her right breast and underwent a diagnostic mammogram.  This revealed an irregular 3 cm mass in the right breast 2 cm from the nipple at 7 o'clock with associated with pleomorphic microcalcifications.  An ultrasound confirmed a 3.2 cm mass in the 7 o'clock position of the right breast with imaging features highly suspicious for malignancy.  The axilla appeared negative.  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  Estrogen and progesterone receptors were positive and HER2 negative.  Ki67 was 40%.  She had a previous history of atypical ductal hyperplasia.  Due to her personal and family history of malignancy, she underwent testing for hereditary cancer syndrome with the [employer] Common Hereditary Cancer panel, which did not did not reveal any clinically significant mutation or variants of uncertain significance.  Bone density scan in September 2020 was normal.  She opted for bilateral mastectomies, due to the high-grade histology, her highly dense breasts, and previous atypical ductal hyperplasia.  She saw Dr. Brantley Stage and Dr. Iran Planas.  She underwent bilateral mastectomies  in September 2020 with placement of saline expanders in preparation for reconstruction.  Pathology revealed a 3.9 cm, grade 3, invasive ductal carcinoma of the right breast.  4 lymph nodes were negative for metastasis.  Margins were clear.  Left breast pathology was negative.  EndoPredict revealed an EP Clin score of 4.7, which is in the high risk category, and correlates with a 33% risk of distant recurrence within 10 years when treated with 5 years of adjuvant hormonal therapy alone, with an estimated 16% absolute benefit from chemotherapy.  She is also at high risk for late recurrence, so we recommend 10 years of adjuvant hormonal therapy.  She received adjuvant chemotherapy with Taxotere/Adriamycin/Cytoxan (TAC) every 3 weeks for total of 6 cycles.  Echocardiogram prior to chemotherapy revealed normal left ventricular size and function with an ejection fraction of 60-65%.  She started TAC in November 2020.  She had fatigue, constipation, and acid reflux, followed later by diarrhea.  Prior to her 3rd cycle of chemotherapy, she was felt to have mild cellulitis of the right mastectomy site, so was placed on cephalexin.  She then developed fevers, despite being on cephalexin.  Urine culture was negative.  Due to the persistent fever, we changed her antibiotic to levofloxacin.  COVID-19 and flu testing were negative.  The fevers slowly resolved.    We proceeded with a 4th cycle of TAC chemotherapy in January.  She had mild elevation of the liver transaminases at that time and hepatitis panel was negative.  She developed recurrent fever for 3 days up to 101.9 and postnasal drip and a mild nonproductive cough.  There was not really a reduction  in her fever with either of the antibiotics previously.  She was using using Tylenol and ibuprofen as needed.  Urine and blood cultures were negative.  The liver transaminases had normalized.  She developed fever around day 10, which was the same timing as after her 3rd cycle  of chemotherapy.  The fevers were ultimately treated to docetaxel.  In January, she developed worsening cough and chest tightness, so was given a course of azithromycin.  She also had progressive anemia, felt to be due to treatment.  She completed 6 cycles of adjuvant TAC chemotherapy in February.  She has had a hysterectomy for benign reasons, but her ovaries are intact.  She experienced menopausal symptoms about 2 years ago and hormone levels are consistent with post menopausal status.  She was placed on anastrozole 1 mg daily in March.  She developed severe muscle and joint pain with anastrozole, which improved off of anastrozole.  Her hormonal therapy was switched to letrozole 2.5 mg daily.  She did not tolerate tramadol either, so she was placed on exemestane.  She has tolerated simvastatin without significant difficulty. She had a left breast ultrasound done at the [facility] in mid August due to nodules of the left breast.  This was felt to be represent necrotic tissue from her reconstruction.  Repeat examination was scheduled in 6 months.  INTERVAL HISTORY:  Natalie Burton is here today for routine follow up of her stage II hormone receptor positive breast cancer.  Unfortunately, she did not tolerate exemestane due to severe fatigue and joint pain.  Her symptoms improved with discontinuation of the medication about 6 weeks ago.  She is currently on Celebrex for joint pain, but has not tried taking the exemestane again. She feels a new nodule in the right reconstruction and is seeing her plastic surgeon tomorrow.  She is also scheduled for mammogram and u/s next month.  She denies fevers or chills. She denies pain. Her appetite is good. Her weight has increased 2 pounds over last 3 months.  Natalie Burton is here for routine follow up ***.  She was placed on tamoxifen 20 mg daily in December and she has tolerated this without significant difficulty.   Her  appetite is good, and she has gained/lost _ pounds  since her last visit.  She denies fever, chills or other signs of infection.  She denies nausea, vomiting, bowel issues, or abdominal pain.  She denies sore throat, cough, dyspnea, or chest pain.  REVIEW OF SYSTEMS:  Review of Systems - Oncology   VITALS:  There were no vitals taken for this visit.  Wt Readings from Last 3 Encounters:  04/15/20 284 lb 1.6 oz (128.9 kg)  09/25/19 277 lb (125.6 kg)  09/11/19 277 lb 12.8 oz (126 kg)    There is no height or weight on file to calculate BMI.  Performance status (ECOG): 0 - Asymptomatic  PHYSICAL EXAM:  Physical Exam Lymph nodes:   There is no cervical, clavicular, axillary or inguinal lymphadenopathy.   LABS:   CBC Latest Ref Rng & Units 07/30/2019 03/12/2019 01/18/2019  WBC 4.0 - 10.5 K/uL 6.9 4.8 6.8  Hemoglobin 12.0 - 15.0 g/dL 12.2 12.8 14.2  Hematocrit 36.0 - 46.0 % 38.1 40.0 43.3  Platelets 150 - 400 K/uL 304 104(L) 245   CMP Latest Ref Rng & Units 07/30/2019 03/12/2019 01/18/2019  Glucose 70 - 99 mg/dL 118(H) 108(H) 99  BUN 6 - 20 mg/dL _0 Creatinine 0.44 - 1.00 mg/dL 0.78  0.75 0.78  Sodium 135 - 145 mmol/L 140 139 137  Potassium 3.5 - 5.1 mmol/L 3.8 3.9 3.9  Chloride 98 - 111 mmol/L 103 104 103  CO2 22 - 32 mmol/L _0 Calcium 8.9 - 10.3 mg/dL 9.2 9.2 9.6  Total Protein 6.5 - 8.1 g/dL - 7.0 7.2  Total Bilirubin 0.3 - 1.2 mg/dL - 0.3 0.5  Alkaline Phos 38 - 126 U/L - 87 96  AST 15 - 41 U/L - 19 21  ALT 0 - 44 U/L - 30 40    STUDIES:  No results found.    HISTORY:    Allergies:  Allergies  Allergen Reactions  . Venlafaxine Shortness Of Breath  . Wellbutrin [Bupropion Hcl] Shortness Of Breath  . Codeine Nausea And Vomiting  . Morphine And Related Itching  . Zanaflex [Tizanidine Hydrochloride]     Slurred speech    Current Medications: Current Outpatient Medications  Medication Sig Dispense Refill  . celecoxib (CELEBREX) 200 MG capsule Take 200 mg by mouth daily.    . cyclobenzaprine (FLEXERIL)  10 MG tablet Take 10 mg by mouth 3 (three) times daily as needed for muscle spasms.    Marland Kitchen escitalopram (LEXAPRO) 10 MG tablet Take 10 mg by mouth daily.     Marland Kitchen esomeprazole (NEXIUM) 40 MG capsule Take 40 mg by mouth daily before breakfast.    . ibuprofen (ADVIL) 800 MG tablet Take 1 tablet (800 mg total) by mouth every 8 (eight) hours as needed. 30 tablet 0  . levothyroxine (SYNTHROID) 175 MCG tablet Take 175 mcg by mouth daily before breakfast.     . LINZESS 145 MCG CAPS capsule Take 145 mcg by mouth daily as needed for constipation.    . rizatriptan (MAXALT) 10 MG tablet Take 10 mg by mouth as needed for migraine. May repeat in 2 hours if needed    . tamoxifen (NOLVADEX) 20 MG tablet Take 1 tablet (20 mg total) by mouth daily. 90 tablet 3   No current facility-administered medications for this visit.     ASSESSMENT & PLAN:   Assessment/Plan: 1. Stage IIA hormone receptor positive breast cancer, diagnosed in July 2020.  Due to a high EP Clin score, she received adjuvant TAC chemotherapy, and completed 6 cycles in February 2021.  She has had a hysterectomy, but her ovaries are intact.  She was recommended for adjuvant hormonal therapy with an aromatase inhibitor, but did not tolerate hormonal therapy with anastrozole, letrozole or exemestane due to side effects of severe fatigue and joint pain.   We therefore tried her on tamoxifen 20 mg daily in December.    2. Abnormal liver transaminases, which have remain mildly elevated and have fluctuated up and down in the same range.  3. Two small nodules of the left  reconstruction felt to be necrotic tissue from her reconstruction.  Repeat examination is scheduled in January.  4. New small nodule of the right reconstruction, lightly also due to be fat necrosis.  She is seeing her Psychiatric nurse tomorrow.  Plan: She was placed on tamoxifen 20 mg daily in December and will plan for at least a total of 5 years of therapy.  She will follow up with her  plastic surgeon tomorrow regarding the new nodule in the right reconstruction, which is likely another area of fat necrosis.   We will plan to see her back in 3 months for reexamination. The patient understands the plans discussed today and is in agreement with  them.    The patient knows to contact our office if she does not tolerate tamoxifen or develops other concerns prior to her next appointment.   I provided 20 minutes of face-to-face time during this this encounter and > 50% was spent counseling as documented under my assessment and plan.    I, Rita Ohara, am acting as scribe for Derwood Kaplan, MD  I have reviewed this report as typed by the medical scribe, and it is complete and accurate.   Hermina Barters

## 2020-07-14 ENCOUNTER — Inpatient Hospital Stay: Payer: 59 | Admitting: Oncology

## 2020-07-17 DIAGNOSIS — H1031 Unspecified acute conjunctivitis, right eye: Secondary | ICD-10-CM | POA: Diagnosis not present

## 2020-07-17 DIAGNOSIS — J209 Acute bronchitis, unspecified: Secondary | ICD-10-CM | POA: Diagnosis not present

## 2020-07-17 DIAGNOSIS — J0100 Acute maxillary sinusitis, unspecified: Secondary | ICD-10-CM | POA: Diagnosis not present

## 2020-07-20 MED FILL — ESOMEPRAZOLE MAG DR 40 MG C: 40 | 90 days supply | Qty: 180 | Fill #1

## 2020-07-20 MED FILL — SYNTHROID 175 MCG TABLET: 175 | 90 days supply | Qty: 90 | Fill #1

## 2020-07-20 MED FILL — CELECOXIB 200 MG CAP: 200 | 90 days supply | Qty: 90 | Fill #1

## 2020-07-24 NOTE — Progress Notes (Signed)
[facility]  [address] [place],  [place]  [address] [phone]  Clinic Day:  07/27/2020  Referring physician: No ref. provider found  This document serves as a record of services personally performed by [name], MD. It was created on their behalf by Curry,Lauren E, a trained medical scribe. The creation of this record is based on the scribe's personal observations and the provider's statements to them.  CHIEF COMPLAINT:  CC: Stage IIA (T2N0M0) hormone receptor positive right breast cancer  Current Treatment: Hormonal therapy with tamoxifen daily for a total of 10 years  HISTORY OF PRESENT ILLNESS:  Natalie Burton is a 55 y.o. 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  She felt a lump in her right breast and underwent a diagnostic mammogram.  This revealed an irregular 3 cm mass in the right breast 2 cm from the nipple at 7 o'clock with associated with pleomorphic microcalcifications.  An ultrasound confirmed a 3.2 cm mass in the 7 o'clock position of the right breast with imaging features highly suspicious for malignancy.  The axilla appeared negative.  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  Estrogen and progesterone receptors were positive and HER2 negative.  Ki67 was 40%.  She had a previous history of atypical ductal hyperplasia.  Due to her personal and family history of malignancy, she underwent testing for hereditary cancer syndrome with the [employer] Common Hereditary Cancer panel, which did not did not reveal any clinically significant mutation or variants of uncertain significance.  Bone density scan in September 2020 was normal.  She underwent bilateral mastectomies at her choice in September 2020 with placement of saline expanders in preparation for reconstruction.  Pathology revealed a 3.9 cm,  grade 3, invasive ductal carcinoma of the right breast.  4 lymph nodes were negative for metastasis.  Margins were clear.  Left breast pathology was negative.  EndoPredict revealed an EP Clin score of 4.7, which is in the high risk category, and correlates with a 33% risk of distant recurrence within 10 years when treated with 5 years of adjuvant hormonal therapy alone, with an estimated 16% absolute benefit from chemotherapy.  She is also at high risk for late recurrence, so we recommend 10 years of adjuvant hormonal therapy.  She received adjuvant chemotherapy with Taxotere/Adriamycin/Cytoxan (TAC) every 3 weeks for total of 6 cycles.  Echocardiogram prior to chemotherapy revealed normal left ventricular size and function with an ejection fraction of 60-65%.  She started TAC in November 2020. She had mild elevation of the liver transaminases at that time and hepatitis panel was negative and this later normalized.  She also had progressive anemia and intermittent fever, felt to be due to treatment.  She completed 6 cycles of adjuvant TAC chemotherapy in February 2021. She has had a hysterectomy for benign reasons, but her ovaries are intact.   She was placed on anastrozole 1 mg daily in March 2021.  She developed severe muscle and joint pain with anastrozole, which improved off of anastrozole.  Her hormonal therapy was switched to letrozole 2.5 mg daily.  She did not tolerate this either, so she was placed on exemestane.  Unfortunately, she did not tolerate exemestane due to severe fatigue and joint pain and was changed to tamoxifen in December 2021.  She had a left breast ultrasound done at the [facility] in mid  August due to nodules of the left breast.  This was felt to be represent necrotic tissue from her reconstruction.  Repeat examination was scheduled in 6 months.  INTERVAL HISTORY:  Natalie Burton is here for routine follow up after started tamoxifen daily in December 2021.  So far she has  tolerated this without significant difficulty.  She states that she developed a cold about 2 weeks ago which was fairly severe.  She is doing better now, and denies complaints today.  She continues to follow with Dr. Irene Limbo, her plastic surgeon, regarding the bilateral nodules of her reconstructions.  She had a bilateral ultrasounds on February 7th which revealed benign fat necrosis changes/oil cysts in the far UPPER-OUTER RIGHT breast/axillary tail corresponding to the patient's palpable abnormality.  As well as evolving changes of benign fat necrosis changes/oil cysts within the UPPER OUTER LEFT breast. No further imaging follow-up recommended..  She follows with her primary physician as needed.  Blood counts and chemistries are unremarkable.  Her  appetite is good, and she has gained 10 pounds since her last visit.  She denies fever, chills or other signs of infection.  She denies nausea, vomiting, bowel issues, or abdominal pain.  She denies sore throat, cough, dyspnea, or chest pain.  REVIEW OF SYSTEMS:  Review of Systems  Constitutional: Negative.  Negative for appetite change, chills, fatigue, fever and unexpected weight change.  HENT:  Negative.   Eyes: Negative.   Respiratory: Negative.  Negative for chest tightness, cough, hemoptysis, shortness of breath and wheezing.   Cardiovascular: Negative.  Negative for chest pain, leg swelling and palpitations.  Gastrointestinal: Negative.  Negative for abdominal distention, abdominal pain, blood in stool, constipation, diarrhea, nausea and vomiting.  Endocrine: Negative.   Genitourinary: Negative.  Negative for difficulty urinating, dysuria, frequency and hematuria.   Musculoskeletal: Negative.  Negative for arthralgias, back pain, flank pain, gait problem and myalgias.  Skin: Negative.   Neurological: Negative.  Negative for dizziness, extremity weakness, gait problem, headaches, light-headedness, numbness, seizures and speech difficulty.   Hematological: Negative.   Psychiatric/Behavioral: Negative.  Negative for depression and sleep disturbance. The patient is not nervous/anxious.      VITALS:  Blood pressure (!) 159/92, pulse 84, temperature 98.1 F (36.7 C), temperature source Oral, resp. rate 18, height _0  (1.676 m), weight 294 lb 4.8 oz (133.5 kg), SpO2 95 %.  Wt Readings from Last 3 Encounters:  07/27/20 294 lb 4.8 oz (133.5 kg)  04/15/20 284 lb 1.6 oz (128.9 kg)  09/25/19 277 lb (125.6 kg)    Body mass index is 47.5 kg/m.  Performance status (ECOG): 0 - Asymptomatic  PHYSICAL EXAM:  Physical Exam Constitutional:      General: She is not in acute distress.    Appearance: Normal appearance. She is normal weight.  HENT:     Head: Normocephalic and atraumatic.  Eyes:     General: No scleral icterus.    Extraocular Movements: Extraocular movements intact.     Conjunctiva/sclera: Conjunctivae normal.     Pupils: Pupils are equal, round, and reactive to light.  Cardiovascular:     Rate and Rhythm: Normal rate and regular rhythm.     Pulses: Normal pulses.     Heart sounds: Normal heart sounds. No murmur heard. No friction rub. No gallop.   Pulmonary:     Effort: Pulmonary effort is normal. No respiratory distress.     Breath sounds: Normal breath sounds.  Chest:     Comments: There is  a tiny nodule in the upper outer quadrant of the right reconstruction and a 2nd one at 10 o'clock.  She has two nodules in the upper outer quadrant of the left reconstruction which are stable.  All of the nodules measure 1 cm or less.  Both reconstructions are negative otherwise. Abdominal:     General: Bowel sounds are normal. There is no distension.     Palpations: Abdomen is soft. There is no hepatomegaly, splenomegaly or mass.     Tenderness: There is no abdominal tenderness.  Musculoskeletal:        General: Normal range of motion.     Cervical back: Normal range of motion and neck supple.     Right lower leg: No  edema.     Left lower leg: No edema.  Lymphadenopathy:     Cervical: No cervical adenopathy.  Skin:    General: Skin is warm and dry.  Neurological:     General: No focal deficit present.     Mental Status: She is alert and oriented to person, place, and time. Mental status is at baseline.  Psychiatric:        Mood and Affect: Mood normal.        Behavior: Behavior normal.        Thought Content: Thought content normal.        Judgment: Judgment normal.    Lymph nodes:   There is no cervical, clavicular, axillary or inguinal lymphadenopathy.   LABS:   CBC Latest Ref Rng & Units 07/30/2019 03/12/2019 01/18/2019  WBC 4.0 - 10.5 K/uL 6.9 4.8 6.8  Hemoglobin 12.0 - 15.0 g/dL 12.2 12.8 14.2  Hematocrit 36.0 - 46.0 % 38.1 40.0 43.3  Platelets 150 - 400 K/uL 304 104(L) 245   CMP Latest Ref Rng & Units 07/30/2019 03/12/2019 01/18/2019  Glucose 70 - 99 mg/dL 118(H) 108(H) 99  BUN 6 - 20 mg/dL _0 Creatinine 0.44 - 1.00 mg/dL 0.78 0.75 0.78  Sodium 135 - 145 mmol/L 140 139 137  Potassium 3.5 - 5.1 mmol/L 3.8 3.9 3.9  Chloride 98 - 111 mmol/L 103 104 103  CO2 22 - 32 mmol/L _1 Calcium 8.9 - 10.3 mg/dL 9.2 9.2 9.6  Total Protein 6.5 - 8.1 g/dL - 7.0 7.2  Total Bilirubin 0.3 - 1.2 mg/dL - 0.3 0.5  Alkaline Phos 38 - 126 U/L - 87 96  AST 15 - 41 U/L - 19 21  ALT 0 - 44 U/L - 30 40    STUDIES:   EXAM: 06/08/2020 ULTRASOUND OF THE BILATERAL BREAST  COMPARISON:  Previous exam(s).  FINDINGS: On physical exam, palpable thickening at the 11 o'clock position of the far UPPER-OUTER RIGHT breast/axillary tail noted.  Targeted ultrasound is performed, showing the following:  RIGHT breast:  Fat necrosis changes with oil cysts at the 11 o'clock position of the far UPPER-OUTER RIGHT breast/axillary tail corresponding to the patient's palpable abnormality.  LEFT breast:  Evolving benign fat necrosis changes with benign cysts at the 1:30 to 2 o'clock position of the  far UPPER OUTER LEFT breast/axillary tail  IMPRESSION: 1. Benign fat necrosis changes/oil cysts in the far UPPER-OUTER RIGHT breast/axillary tail corresponding to the patient's palpable abnormality. 2. Evolving changes of benign fat necrosis changes/oil cysts within the UPPER OUTER LEFT breast. No further imaging follow-up recommended.  RECOMMENDATION: Clinical follow-up as indicated. Any further workup should be based on clinical grounds.   HISTORY:   Allergies:  Allergies  Allergen Reactions  . Venlafaxine Shortness Of Breath  . Wellbutrin [Bupropion Hcl] Shortness Of Breath  . Codeine Nausea And Vomiting  . Morphine And Related Itching  . Zanaflex [Tizanidine Hydrochloride]     Slurred speech    Current Medications: Current Outpatient Medications  Medication Sig Dispense Refill  . celecoxib (CELEBREX) 200 MG capsule Take 200 mg by mouth daily.    . cyclobenzaprine (FLEXERIL) 10 MG tablet Take 10 mg by mouth 3 (three) times daily as needed for muscle spasms.    Marland Kitchen escitalopram (LEXAPRO) 10 MG tablet Take 10 mg by mouth daily.     Marland Kitchen esomeprazole (NEXIUM) 40 MG capsule Take 40 mg by mouth daily before breakfast.    . ibuprofen (ADVIL) 800 MG tablet Take 1 tablet (800 mg total) by mouth every 8 (eight) hours as needed. 30 tablet 0  . levothyroxine (SYNTHROID) 175 MCG tablet Take 175 mcg by mouth daily before breakfast.     . LINZESS 145 MCG CAPS capsule Take 145 mcg by mouth daily as needed for constipation.    . moxifloxacin (VIGAMOX) 0.5 % ophthalmic solution Apply to eye.    . rizatriptan (MAXALT) 10 MG tablet Take 10 mg by mouth as needed for migraine. May repeat in 2 hours if needed    . tamoxifen (NOLVADEX) 20 MG tablet Take 1 tablet (20 mg total) by mouth daily. 90 tablet 3   No current facility-administered medications for this visit.     ASSESSMENT & PLAN:   Assessment: 1. Stage IIA hormone receptor positive breast cancer, diagnosed in July 2020.  Due to a  high EP Clin score, she received adjuvant TAC chemotherapy, and completed 6 cycles in February.  [place] and estradiol were both well within the postmenopausal phase, so she was recommended for adjuvant hormonal therapy with an aromatase inhibitor.  She did not tolerate any of the three aromatase inhibitors. Her only other option for endocrine therapy was tamoxifen and so she started this in December 2021.    2.  She has had a hysterectomy, but her ovaries are intact.  3. Abnormal liver transaminases, which have remain mildly elevated and have fluctuated up and down in the same range.  4. Two small nodules of the left reconstruction felt to be necrotic tissue from her reconstruction.  She continues to follow with Dr. Iran Planas routinely.   5. Two small nodules of the right reconstruction, likely to be fat necrosis also.  She continues to follow with Dr. Iran Planas routinely.   6.  Normal bone density.  She will be due for repeat examination in September 2022.  Plan: She started tamoxifen 20 mg daily in December and has tolerated this without significant difficulty.  We plan for a total of 10 years of therapy.  I will call her with the results of her labs.  We will plan to see her back in 3-4 months for reexamination.  If all is well following that appointment, we can go to 4 month follow up and will plan a repeat DXA in the fall.  The patient understands the plans discussed today and is in agreement with them.  The patient knows to contact our office if she develops other concerns prior to her next appointment.   I provided 20 minutes of face-to-face time during this this encounter and > 50% was spent counseling as documented under my assessment and plan.   I, Rita Ohara, am acting as scribe for Derwood Kaplan, MD  I have reviewed  this report as typed by the medical scribe, and it is complete and accurate.  Hermina Barters

## 2020-07-27 ENCOUNTER — Encounter: Payer: Self-pay | Admitting: Oncology

## 2020-07-27 ENCOUNTER — Inpatient Hospital Stay: Payer: 59 | Attending: Oncology | Admitting: Oncology

## 2020-07-27 ENCOUNTER — Inpatient Hospital Stay: Payer: 59

## 2020-07-27 ENCOUNTER — Other Ambulatory Visit: Payer: Self-pay

## 2020-07-27 ENCOUNTER — Telehealth: Payer: Self-pay | Admitting: Oncology

## 2020-07-27 ENCOUNTER — Other Ambulatory Visit: Payer: Self-pay | Admitting: Hematology and Oncology

## 2020-07-27 ENCOUNTER — Telehealth: Payer: Self-pay

## 2020-07-27 VITALS — BP 159/92 | HR 84 | Temp 98.1°F | Resp 18 | Ht 66.0 in | Wt 294.3 lb

## 2020-07-27 DIAGNOSIS — Z170 Estrogen receptor positive status [ER+]: Secondary | ICD-10-CM

## 2020-07-27 DIAGNOSIS — C50511 Malignant neoplasm of lower-outer quadrant of right female breast: Secondary | ICD-10-CM

## 2020-07-27 DIAGNOSIS — C50911 Malignant neoplasm of unspecified site of right female breast: Secondary | ICD-10-CM | POA: Diagnosis not present

## 2020-07-27 LAB — COMPREHENSIVE METABOLIC PANEL
Albumin: 4.4 (ref 3.5–5.0)
Calcium: 9 (ref 8.7–10.7)

## 2020-07-27 LAB — CBC: RBC: 4.57 (ref 3.87–5.11)

## 2020-07-27 LAB — CBC AND DIFFERENTIAL
HCT: 42 (ref 36–46)
Hemoglobin: 13.9 (ref 12.0–16.0)
Neutrophils Absolute: 5.03
Platelets: 240 (ref 150–399)
WBC: 7.4

## 2020-07-27 LAB — BASIC METABOLIC PANEL
BUN: 14 (ref 4–21)
CO2: 29 — AB (ref 13–22)
Chloride: 101 (ref 99–108)
Creatinine: 0.7 (ref 0.5–1.1)
Glucose: 138
Potassium: 4.2 (ref 3.4–5.3)
Sodium: 137 (ref 137–147)

## 2020-07-27 LAB — HEPATIC FUNCTION PANEL
ALT: 36 — AB (ref 7–35)
AST: 21 (ref 13–35)
Alkaline Phosphatase: 99 (ref 25–125)
Bilirubin, Total: 0.5

## 2020-07-27 NOTE — Telephone Encounter (Signed)
-----   Message from Derwood Kaplan, MD sent at 07/27/2020  1:41 PM EDT ----- Regarding: call pt Tell her labs look great, only abnormal reading was a nonfasting BS of 138, not bad at all. So I will not order labs for next time, liver all back to normal

## 2020-07-27 NOTE — Telephone Encounter (Signed)
Per 3/28 los next appt scheduled and given to patient

## 2020-07-27 NOTE — Telephone Encounter (Signed)
Patient called and notified of lab results. 

## 2020-08-11 ENCOUNTER — Other Ambulatory Visit (HOSPITAL_COMMUNITY): Payer: Self-pay

## 2020-08-11 MED FILL — Tamoxifen Citrate Tab 20 MG (Base Equivalent): ORAL | 90 days supply | Qty: 90 | Fill #0 | Status: CN

## 2020-08-19 ENCOUNTER — Other Ambulatory Visit (HOSPITAL_COMMUNITY): Payer: Self-pay

## 2020-08-19 MED FILL — Tamoxifen Citrate Tab 20 MG (Base Equivalent): ORAL | 90 days supply | Qty: 90 | Fill #0 | Status: AC

## 2020-08-21 ENCOUNTER — Other Ambulatory Visit (HOSPITAL_COMMUNITY): Payer: Self-pay

## 2020-08-27 DIAGNOSIS — J209 Acute bronchitis, unspecified: Secondary | ICD-10-CM | POA: Diagnosis not present

## 2020-08-27 DIAGNOSIS — N760 Acute vaginitis: Secondary | ICD-10-CM | POA: Diagnosis not present

## 2020-08-28 DIAGNOSIS — R053 Chronic cough: Secondary | ICD-10-CM | POA: Diagnosis not present

## 2020-08-28 DIAGNOSIS — R059 Cough, unspecified: Secondary | ICD-10-CM | POA: Diagnosis not present

## 2020-08-28 DIAGNOSIS — J209 Acute bronchitis, unspecified: Secondary | ICD-10-CM | POA: Diagnosis not present

## 2020-10-01 DIAGNOSIS — Z79899 Other long term (current) drug therapy: Secondary | ICD-10-CM | POA: Diagnosis not present

## 2020-10-01 DIAGNOSIS — C50511 Malignant neoplasm of lower-outer quadrant of right female breast: Secondary | ICD-10-CM | POA: Diagnosis not present

## 2020-10-01 DIAGNOSIS — F330 Major depressive disorder, recurrent, mild: Secondary | ICD-10-CM | POA: Diagnosis not present

## 2020-10-01 DIAGNOSIS — Z0000 Encounter for general adult medical examination without abnormal findings: Secondary | ICD-10-CM | POA: Diagnosis not present

## 2020-10-01 DIAGNOSIS — R5383 Other fatigue: Secondary | ICD-10-CM | POA: Diagnosis not present

## 2020-10-01 DIAGNOSIS — Z0001 Encounter for general adult medical examination with abnormal findings: Secondary | ICD-10-CM | POA: Diagnosis not present

## 2020-10-01 DIAGNOSIS — E559 Vitamin D deficiency, unspecified: Secondary | ICD-10-CM | POA: Diagnosis not present

## 2020-10-01 DIAGNOSIS — K219 Gastro-esophageal reflux disease without esophagitis: Secondary | ICD-10-CM | POA: Insufficient documentation

## 2020-10-01 DIAGNOSIS — E039 Hypothyroidism, unspecified: Secondary | ICD-10-CM | POA: Diagnosis not present

## 2020-10-01 DIAGNOSIS — Z170 Estrogen receptor positive status [ER+]: Secondary | ICD-10-CM | POA: Diagnosis not present

## 2020-10-01 DIAGNOSIS — M2550 Pain in unspecified joint: Secondary | ICD-10-CM | POA: Diagnosis not present

## 2020-10-01 DIAGNOSIS — E782 Mixed hyperlipidemia: Secondary | ICD-10-CM | POA: Diagnosis not present

## 2020-10-01 DIAGNOSIS — E538 Deficiency of other specified B group vitamins: Secondary | ICD-10-CM | POA: Insufficient documentation

## 2020-10-02 ENCOUNTER — Other Ambulatory Visit (HOSPITAL_COMMUNITY): Payer: Self-pay

## 2020-10-02 MED ORDER — ROSUVASTATIN CALCIUM 10 MG PO TABS
10.0000 mg | ORAL_TABLET | Freq: Every day | ORAL | 2 refills | Status: DC
  Filled 2020-10-02: qty 30, 30d supply, fill #0

## 2020-10-14 ENCOUNTER — Other Ambulatory Visit (HOSPITAL_COMMUNITY): Payer: Self-pay

## 2020-10-14 MED ORDER — CELECOXIB 200 MG PO CAPS
200.0000 mg | ORAL_CAPSULE | ORAL | 3 refills | Status: DC
  Filled 2021-05-03: qty 90, 90d supply, fill #0
  Filled 2021-08-23: qty 90, 90d supply, fill #1

## 2020-10-14 MED ORDER — CYANOCOBALAMIN 1000 MCG/ML IJ SOLN
INTRAMUSCULAR | 10 refills | Status: DC
  Filled 2020-10-14: qty 1, 30d supply, fill #0

## 2020-10-14 MED ORDER — SEMAGLUTIDE-WEIGHT MANAGEMENT 0.25 MG/0.5ML ~~LOC~~ SOAJ: SUBCUTANEOUS | 0 refills | Status: DC

## 2020-10-14 MED ORDER — ESCITALOPRAM OXALATE 10 MG PO TABS
20.0000 mg | ORAL_TABLET | Freq: Every day | ORAL | 2 refills | Status: DC
  Filled 2021-01-11: qty 180, 90d supply, fill #0
  Filled 2021-04-12: qty 180, 90d supply, fill #1
  Filled 2021-07-16: qty 180, 90d supply, fill #2

## 2020-10-14 MED ORDER — SYNTHROID 175 MCG PO TABS
175.0000 ug | ORAL_TABLET | Freq: Every day | ORAL | 3 refills | Status: DC
  Filled 2020-10-14: qty 90, 90d supply, fill #0
  Filled 2021-01-11: qty 90, 90d supply, fill #1
  Filled 2021-04-12: qty 90, 90d supply, fill #2
  Filled 2021-07-16: qty 90, 90d supply, fill #3

## 2020-10-15 ENCOUNTER — Other Ambulatory Visit (HOSPITAL_COMMUNITY): Payer: Self-pay

## 2020-10-22 ENCOUNTER — Other Ambulatory Visit (HOSPITAL_COMMUNITY): Payer: Self-pay

## 2020-10-22 MED FILL — Celecoxib Cap 200 MG: ORAL | 90 days supply | Qty: 90 | Fill #0 | Status: AC

## 2020-10-23 ENCOUNTER — Other Ambulatory Visit (HOSPITAL_COMMUNITY): Payer: Self-pay

## 2020-10-26 ENCOUNTER — Other Ambulatory Visit (HOSPITAL_COMMUNITY): Payer: Self-pay

## 2020-10-27 ENCOUNTER — Other Ambulatory Visit (HOSPITAL_COMMUNITY): Payer: Self-pay

## 2020-10-28 ENCOUNTER — Other Ambulatory Visit (HOSPITAL_COMMUNITY): Payer: Self-pay

## 2020-10-28 MED ORDER — ESOMEPRAZOLE MAGNESIUM 40 MG PO CPDR
DELAYED_RELEASE_CAPSULE | ORAL | 3 refills | Status: DC
  Filled 2020-10-28: qty 180, 90d supply, fill #0
  Filled 2021-01-25: qty 180, 90d supply, fill #1
  Filled 2021-04-12: qty 180, 90d supply, fill #2
  Filled 2021-07-26: qty 180, 90d supply, fill #3

## 2020-10-28 MED ORDER — CYANOCOBALAMIN 1000 MCG/ML IJ SOLN
INTRAMUSCULAR | 11 refills | Status: DC
  Filled 2020-10-28: qty 1, 30d supply, fill #0

## 2020-11-18 ENCOUNTER — Other Ambulatory Visit (HOSPITAL_COMMUNITY): Payer: Self-pay

## 2020-11-20 NOTE — Progress Notes (Deleted)
[facility]  [address] [place],  [place]  [address] [phone]  Clinic Day:  11/20/2020  Referring physician: No ref. provider found   CHIEF COMPLAINT:  CC:  Stage II A hormone receptor positive right breast cancer  Current Treatment:   Tamoxifen 20 mg daily   HISTORY OF PRESENT ILLNESS:  Natalie Burton is a 55 y.o. 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  She felt a lump in her right breast and underwent a diagnostic mammogram.  This revealed an irregular 3 cm mass in the right breast 2 cm from the nipple at 7 o'clock with associated with pleomorphic microcalcifications.  An ultrasound confirmed a 3.2 cm mass in the 7 o'clock position of the right breast with imaging features highly suspicious for malignancy.  The axilla appeared negative.  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  Estrogen and progesterone receptors were positive and HER2 negative.  Ki67 was 40%.  She had a previous history of atypical ductal hyperplasia.  Due to her personal and family history of malignancy, she underwent testing for hereditary cancer syndrome with the [employer] Common Hereditary Cancer panel, which did not did not reveal any clinically significant mutation or variants of uncertain significance.  Bone density scan in September 2020 was normal.  She underwent bilateral mastectomies at her choice in September 2020 with placement of saline expanders in preparation for reconstruction.  Pathology revealed a 3.9 cm, grade 3, invasive ductal carcinoma of the right breast.  4 lymph nodes were negative for metastasis.  Margins were clear.  Left breast pathology was negative.  EndoPredict revealed an EP Clin score of 4.7, which is in the high risk category, and correlates with a 33% risk of distant recurrence within 10 years when treated with  5 years of adjuvant hormonal therapy alone, with an estimated 16% absolute benefit from chemotherapy.  She is also at high risk for late recurrence, so we recommend 10 years of adjuvant hormonal therapy.  She received adjuvant chemotherapy with Taxotere/Adriamycin/Cytoxan (TAC) every 3 weeks for total of 6 cycles.  Echocardiogram prior to chemotherapy revealed normal left ventricular size and function with an ejection fraction of 60-65%.  She started TAC in November 2020. She had mild elevation of the liver transaminases at that time and hepatitis panel was negative and this later normalized.  She also had progressive anemia and intermittent fever, felt to be due to treatment.    She completed 6 cycles of adjuvant TAC chemotherapy in February 2021. She has had a hysterectomy for benign reasons, but her ovaries are intact. She was placed on anastrozole 1 mg daily in March 2021.  She developed severe muscle and joint pain with anastrozole, which improved off of anastrozole.  Her hormonal therapy was switched to letrozole 2.5 mg daily.  She did not tolerate this either, so she was placed on exemestane.  She had a left breast ultrasound done at the [facility] in mid August due to nodules of the left breast.  This was felt to be represent necrotic tissue from her reconstruction.  Repeat examination was scheduled in 6 months. She continues to follow with Dr. Irene Limbo, her plastic surgeon, regarding the bilateral nodules of her reconstructions. Unfortunately, she did not tolerate exemestane due to severe fatigue and joint pain and was changed to tamoxifen in December 2021.  She had  ultrasound of the bilateral reconstructions in February revealed benign fat necrosis changes/oil cysts in the far upper outer breast reconstruction/axillary tail corresponding to the patient's palpable abnormality.  As well as evolving changes of benign fat necrosis changes/oil cysts within the upper outer left breast.  No further imaging follow-up recommended..   INTERVAL HISTORY:  [name] is here today for repeat clinical assessment. She denies fevers or chills. She denies pain. Her appetite is good. Her weight {Weight change:10426}.  REVIEW OF SYSTEMS:  Review of Systems  Constitutional:  Negative for appetite change, chills, fatigue, fever and unexpected weight change.  HENT:   Negative for lump/mass, mouth sores and sore throat.   Respiratory:  Negative for cough and shortness of breath.   Cardiovascular:  Negative for chest pain and leg swelling.  Gastrointestinal:  Negative for abdominal pain, constipation, diarrhea, nausea and vomiting.  Endocrine: Negative for hot flashes.  Genitourinary:  Negative for difficulty urinating, dysuria, frequency and hematuria.   Musculoskeletal:  Negative for arthralgias, back pain and myalgias.  Skin:  Negative for rash.  Neurological:  Negative for dizziness and headaches.  Hematological:  Negative for adenopathy. Does not bruise/bleed easily.  Psychiatric/Behavioral:  Negative for depression and sleep disturbance. The patient is not nervous/anxious.     VITALS:  There were no vitals taken for this visit.  Wt Readings from Last 3 Encounters:  07/27/20 294 lb 4.8 oz (133.5 kg)  04/15/20 284 lb 1.6 oz (128.9 kg)  09/25/19 277 lb (125.6 kg)    There is no height or weight on file to calculate BMI.  Performance status (ECOG): {CHL ONC [id number]  PHYSICAL EXAM:  Physical Exam Vitals and nursing note reviewed.  Constitutional:      General: She is not in acute distress.    Appearance: Normal appearance.  HENT:     Head: Normocephalic and atraumatic.     Mouth/Throat:     Mouth: Mucous membranes are moist.     Pharynx: Oropharynx is clear. No oropharyngeal exudate or posterior oropharyngeal erythema.  Eyes:     General: No scleral icterus.    Extraocular Movements: Extraocular movements intact.     Conjunctiva/sclera: Conjunctivae normal.      Pupils: Pupils are equal, round, and reactive to light.  Cardiovascular:     Rate and Rhythm: Normal rate and regular rhythm.     Heart sounds: Normal heart sounds. No murmur heard.   No friction rub. No gallop.  Pulmonary:     Effort: Pulmonary effort is normal.     Breath sounds: Normal breath sounds. No wheezing, rhonchi or rales.  Chest:  Breasts:    Right: No axillary adenopathy or supraclavicular adenopathy.     Left: No axillary adenopathy or supraclavicular adenopathy.  Abdominal:     General: There is no distension.     Palpations: Abdomen is soft. There is no hepatomegaly, splenomegaly or mass.     Tenderness: There is no abdominal tenderness.  Musculoskeletal:        General: Normal range of motion.     Cervical back: Normal range of motion and neck supple. No tenderness.     Right lower leg: No edema.     Left lower leg: No edema.  Lymphadenopathy:     Cervical: No cervical adenopathy.     Upper Body:     Right upper body: No supraclavicular or axillary adenopathy.     Left upper body: No supraclavicular or axillary adenopathy.     Lower Body: No  right inguinal adenopathy. No left inguinal adenopathy.  Skin:    General: Skin is warm and dry.     Coloration: Skin is not jaundiced.     Findings: No rash.  Neurological:     Mental Status: She is alert and oriented to person, place, and time.     Cranial Nerves: No cranial nerve deficit.  Psychiatric:        Mood and Affect: Mood normal.        Behavior: Behavior normal.        Thought Content: Thought content normal.    LABS:   CBC Latest Ref Rng & Units 07/27/2020 07/30/2019 03/12/2019  WBC - 7.4 6.9 4.8  Hemoglobin 12.0 - 16.0 13.9 12.2 12.8  Hematocrit 36 - 46 42 38.1 40.0  Platelets 150 - 399 240 304 104(L)   CMP Latest Ref Rng & Units 07/27/2020 07/30/2019 03/12/2019  Glucose 70 - 99 mg/dL - 118(H) 108(H)  BUN 4 - _0 Creatinine 0.5 - 1.1 0.7 0.78 0.75  Sodium 137 - 147 137 140 139  Potassium 3.4  - 5.3 4.2 3.8 3.9  Chloride 99 - 108 101 103 104  CO2 13 - 22 29(A) 25 23  Calcium 8.7 - 10.7 9.0 9.2 9.2  Total Protein 6.5 - 8.1 g/dL - - 7.0  Total Bilirubin 0.3 - 1.2 mg/dL - - 0.3  Alkaline Phos 25 - 125 99 - 87  AST 13 - 35 21 - 19  ALT 7 - 35 36(A) - 30     No results found for: CEA1 / No results found for: CEA1 No results found for: PSA1 No results found for: NMM768 No results found for: CAN125  No results found for: TOTALPROTELP, ALBUMINELP, A1GS, A2GS, BETS, BETA2SER, GAMS, MSPIKE, SPEI No results found for: TIBC, FERRITIN, IRONPCTSAT No results found for: LDH  STUDIES:  No results found.    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;  Service: General;  Laterality: Right;   BREAST RECONSTRUCTION WITH PLACEMENT OF TISSUE EXPANDER AND ALLODERM Bilateral 01/22/2019   Procedure: BILATERAL BREAST RECONSTRUCTION WITH PLACEMENT OF TISSUE EXPANDER AND ALLODERM;  Surgeon: Irene Limbo, MD;  Location: [facility];  Service: Plastics;  Laterality: Bilateral;   CHOLECYSTECTOMY  1991   lap choli   COLONOSCOPY  08/24/2016   Colonic polyp status post polypectomy. Small internal  hemorrhoids.   LIPOSUCTION WITH LIPOFILLING Bilateral 08/02/2019   Procedure: LIPOFILLING bilateral chest;  Surgeon: Irene Limbo, MD;  Location: [facility];  Service: Plastics;  Laterality: Bilateral;   MASTECTOMY Bilateral 01/2019   MASTECTOMY W/ SENTINEL NODE BIOPSY Bilateral 01/22/2019   Procedure: BILATERAL SIMPLE MASTECTOMY WITH RIGHT SENTINEL LYMPH NODE MAPPING;  Surgeon: Erroll Luna, MD;  Location: [facility];  Service: General;  Laterality: Bilateral;  RNFA REQUESTED   PORT-A-CATH REMOVAL Right 08/02/2019   Procedure: REMOVAL PORT-A-CATH;  Surgeon: Irene Limbo, MD;  Location: [facility];  Service: Plastics;  Laterality: Right;   PORTACATH PLACEMENT N/A 03/12/2019   Procedure: INSERTION PORT-A-CATH WITH ULTRASOUND;  Surgeon: Erroll Luna, MD;  Location: [facility];  Service: General;  Laterality: N/A;   REMOVAL OF BILATERAL TISSUE EXPANDERS WITH PLACEMENT OF BILATERAL BREAST IMPLANTS Bilateral 08/02/2019   Procedure: REMOVAL OF BILATERAL TISSUE EXPANDERS WITH PLACEMENT OF BILATERAL BREAST IMPLANTS;  Surgeon: Irene Limbo, MD;  Location: [facility];  Service: Plastics;  Laterality: Bilateral;   SKIN BIOPSY Left 08/02/2019   Procedure: Biopsy Skin;  Surgeon: Irene Limbo, MD;  Location: [facility];  Service: Plastics;  Laterality: Left;   TUBAL LIGATION  1998    Family History  Problem Relation Age of Onset   Cancer Mother        colon, breast   Breast cancer Mother    Colon cancer Mother 53   Colon polyps Mother 41   Cancer Father        non hodgkins lymphoma   Esophageal cancer Neg Hx    Rectal cancer Neg Hx    Stomach cancer Neg Hx     Social History:  reports that she quit smoking about 4 years ago. She smoked an average of .25 packs per day. She has never used smokeless tobacco. She reports current alcohol use. She reports that she does not use drugs.The patient is {Blank single:19197::"alone","accompanied by"} *** today.  Allergies:  Allergies  Allergen Reactions   Venlafaxine  Shortness Of Breath   Wellbutrin [Bupropion Hcl] Shortness Of Breath   Codeine Nausea And Vomiting   Morphine And Related Itching   Zanaflex [Tizanidine Hydrochloride]     Slurred speech    Current Medications: Current Outpatient Medications  Medication Sig Dispense Refill   celecoxib (CELEBREX) 200 MG capsule Take 200 mg by mouth daily.     celecoxib (CELEBREX) 200 MG capsule TAKE 1 CAPSULE (200 MG TOTAL) BY MOUTH DAILY. 90 capsule 3   celecoxib (CELEBREX) 200 MG capsule Take 1 capsule (200 mg total) by mouth. 90 capsule 3   cyanocobalamin (,VITAMIN B-12,) 1000 MCG/ML injection Inject 1 ml into the muscle every 30 days 1 mL 10   cyanocobalamin (,VITAMIN B-12,) 1000 MCG/ML injection Inject 1 mL (1,000 mcg total) into the muscle every 30 days. 1 mL 11   cyclobenzaprine (FLEXERIL) 10 MG tablet Take 10 mg by mouth 3 (three) times daily as needed for muscle spasms.     escitalopram (LEXAPRO) 10 MG tablet Take 10 mg by mouth daily.      escitalopram (LEXAPRO) 10 MG tablet TAKE 1 TABLET BY MOUTH DAILY 90 tablet 0   escitalopram (LEXAPRO) 10 MG tablet TAKE 1 TABLET BY MOUTH DAILY 90 tablet 0   escitalopram (LEXAPRO) 10 MG tablet Take 2 tablets (20 mg total) by mouth daily. 180 tablet 2   esomeprazole (NEXIUM) 40 MG capsule Take 40 mg by mouth daily before breakfast.     esomeprazole (NEXIUM) 40 MG capsule TAKE 1 CAPSULE (40 MG TOTAL) BY MOUTH 2 TIMES DAILY. 180 capsule 1   esomeprazole (NEXIUM) 40 MG capsule Take 1 capsule (40 mg total) by mouth 2  times daily. 180 capsule 3   ibuprofen (ADVIL) 800 MG tablet Take 1 tablet (800 mg total) by mouth every 8 (eight) hours as needed. 30 tablet 0   levothyroxine (SYNTHROID) 175 MCG tablet Take 175 mcg by mouth daily before breakfast.      LINZESS 145 MCG CAPS capsule Take 145 mcg by mouth daily as needed for constipation.     moxifloxacin (VIGAMOX) 0.5 % ophthalmic solution Apply to eye.     rizatriptan (MAXALT) 10 MG tablet Take 10 mg by mouth as  needed for migraine. May repeat in 2 hours if needed     rosuvastatin (CRESTOR) 10 MG tablet Take 1 tablet (10 mg total) by mouth daily. 30 tablet 2   Semaglutide-Weight Management 0.25 MG/0.5ML SOAJ Inject 0.5 mls into the skin every 7 days 2 mL 0   SYNTHROID 175 MCG tablet TAKE 1 TABLET (175 MCG TOTAL) BY MOUTH DAILY AT 6 AM. 90 tablet 1   SYNTHROID 175 MCG tablet Take 1 tablet (175 mcg total) by mouth daily at 6am 90 tablet 3   tamoxifen (NOLVADEX) 20 MG tablet TAKE 1 TABLET (20 MG TOTAL) BY MOUTH DAILY. 90 tablet 3   No current facility-administered medications for this visit.     ASSESSMENT & PLAN:   Assessment:    1. Stage IIA hormone receptor positive breast cancer, diagnosed in July 2020.  Due to a high EP Clin score, she received adjuvant TAC chemotherapy, and completed 6 cycles in February.  Garrett and estradiol were both well within the postmenopausal phase, so she was recommended for adjuvant hormonal therapy with an aromatase inhibitor.  She did not tolerate any of the three aromatase inhibitors. She continues to tolerate tamoxifen without significant difficulty.     2.  She has had a hysterectomy, but her ovaries are intact.   3. Abnormal liver transaminases, which have remain mildly elevated and have fluctuated up and down in the same range.   4. Two small nodules of the left reconstruction felt to be necrotic tissue from her reconstruction.  She continues to follow with Dr. Iran Planas routinely.    5. Two small nodules of the right reconstruction, likely to be fat necrosis also.  She continues to follow with Dr. Iran Planas routinely.    6.  Normal bone density. As she is no longer on aromatase inhibitor, she will not need routine bone density scan until age 81.   Plan:  She knows to continue tamoxifen 20 mg daily in December and has tolerated this without significant difficulty.  We plan for a total of 10 years of therapy.  I will call her with the results of her labs.  We will  plan to see her back in 4 months for reexamination.  The patient understands the plans discussed today and is in agreement with them.  The patient knows to contact our office if she develops concerns prior to her next appointment.     I provided *** minutes (4:24 PM - 4:24 PM) of face-to-face time during this encounter and > 50% was spent counseling as documented under my assessment and plan.    Marvia Pickles, PA-C

## 2020-11-23 ENCOUNTER — Inpatient Hospital Stay: Payer: 59 | Admitting: Hematology and Oncology

## 2020-11-23 ENCOUNTER — Inpatient Hospital Stay: Payer: 59

## 2020-11-23 DIAGNOSIS — Z170 Estrogen receptor positive status [ER+]: Secondary | ICD-10-CM

## 2020-11-26 ENCOUNTER — Inpatient Hospital Stay: Payer: 59 | Attending: Hematology and Oncology | Admitting: Hematology and Oncology

## 2020-11-26 ENCOUNTER — Other Ambulatory Visit: Payer: Self-pay

## 2020-11-26 ENCOUNTER — Encounter: Payer: Self-pay | Admitting: Hematology and Oncology

## 2020-11-26 VITALS — BP 162/104 | HR 79 | Temp 98.5°F | Resp 16 | Ht 66.0 in | Wt 298.5 lb

## 2020-11-26 DIAGNOSIS — Z13820 Encounter for screening for osteoporosis: Secondary | ICD-10-CM

## 2020-11-26 DIAGNOSIS — C50511 Malignant neoplasm of lower-outer quadrant of right female breast: Secondary | ICD-10-CM

## 2020-11-26 DIAGNOSIS — Z170 Estrogen receptor positive status [ER+]: Secondary | ICD-10-CM

## 2020-11-26 NOTE — Progress Notes (Signed)
[facility]  [address] [place],  [place]  [address] [phone]  Clinic Day:  11/26/2020  Referring physician: No ref. provider found   CHIEF COMPLAINT:  CC:   Stage II A hormone receptor positive breast cancer  Current Treatment:   Tamoxifen 20 mg daily   HISTORY OF PRESENT ILLNESS:  Natalie Burton is a 55 y.o. 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  She felt a lump in her right breast and underwent a diagnostic mammogram.  This revealed an irregular 3 cm mass in the right breast 2 cm from the nipple at 7 o'clock with associated with pleomorphic microcalcifications.  An ultrasound confirmed a 3.2 cm mass in the 7 o'clock position of the right breast with imaging features highly suspicious for malignancy.  The axilla appeared negative.  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  Estrogen and progesterone receptors were positive and HER2 negative.  Ki67 was 40%.  She had a previous history of atypical ductal hyperplasia.  Due to her personal and family history of malignancy, she underwent testing for hereditary cancer syndrome with the [employer] Common Hereditary Cancer panel, which did not did not reveal any clinically significant mutation or variants of uncertain significance.  Bone density scan in September 2020 was normal. She underwent bilateral mastectomies at her choice in September 2020 with placement of saline expanders in preparation for reconstruction.  Pathology revealed a 3.9 cm, grade 3, invasive ductal carcinoma of the right breast.  4 lymph nodes were negative for metastasis.  Margins were clear.  Left breast pathology was negative.  EndoPredict revealed an EP Clin score of 4.7, which is in the high risk category, and correlates with a 33% risk of distant recurrence within 10 years when treated with 5  years of adjuvant hormonal therapy alone, with an estimated 16% absolute benefit from chemotherapy.  She is also at high risk for late recurrence, so we recommend 10 years of adjuvant hormonal therapy.  She received adjuvant chemotherapy with Taxotere/Adriamycin/Cytoxan (TAC) every 3 weeks for total of 6 cycles.  Echocardiogram prior to chemotherapy revealed normal left ventricular size and function with an ejection fraction of 60-65%.  She started TAC in November 2020. She had mild elevation of the liver transaminases at that time and hepatitis panel was negative and this later normalized.  She also had progressive anemia and intermittent fever, felt to be due to treatment.  She completed 6 cycles of adjuvant TAC chemotherapy in February 2021. She has had a hysterectomy for benign reasons, but her ovaries are intact.   She was placed on anastrozole 1 mg daily in March 2021.  She developed severe muscle and joint pain with anastrozole, which improved off of anastrozole.  Her hormonal therapy was switched to letrozole 2.5 mg daily.  She did not tolerate this either, so she was placed on exemestane.  Unfortunately, she did not tolerate exemestane due to severe fatigue and joint pain and was changed to tamoxifen in December 2021.  She had a left breast reconstruction ultrasound done at the [facility] in mid August due to nodules of the left reconstruction.  This was felt to be represent necrotic tissue from her reconstruction.  Repeat examination was scheduled in 6 months.  Ultrasound of the bilateral breast reconstructions in February of this year revealed benign fat necrosis changes/oil cysts in the   far upper outer right breast/axillary tail.  There were evolving changes of benign fat necrosis changes/oil cysts within the upper outer left breast. No further imaging follow-up recommended.  INTERVAL HISTORY:  Natalie Burton is here today for repeat clinical assessment.  She states she continues tamoxifen  daily without significant difficulty. She has had continued fatigue.  She also reports continued cognitive changes, with difficulty with attention and word-finding.  She reports infrequent night sweats, she otherwise denies hot flashes. She denies any new issues with reconstructions.  She continues to follow with Dr. Thimmappa and states she recommended annual ultrasound of the reconstructions. She had an upper respiratory infection in the spring and was placed on levofloxacin.  Unfortunately, she developed lower extremity tendinitis with this. She denies fevers or chills. She denies pain. Her appetite is good. Her weight has increased 4 pounds over last 4 months . She had labs in June at her primary care provider office.  CBC and comprehensive metabolic panel were normal. Vitamin-D was extremely low at 15.  B12 was low normal at 208. TSH was normal.  She was started on B12 injections monthly and given vitamin D 10,000 international units weekly for 4 weeks and is now on vitamin D3 2000 international units daily.  REVIEW OF SYSTEMS:  Review of Systems  Constitutional:  Negative for appetite change, chills, fatigue, fever and unexpected weight change.  HENT:   Negative for lump/mass, mouth sores and sore throat.   Respiratory:  Negative for cough and shortness of breath.   Cardiovascular:  Negative for chest pain and leg swelling.  Gastrointestinal:  Negative for abdominal pain, constipation, diarrhea, nausea and vomiting.  Endocrine: Negative for hot flashes.  Genitourinary:  Negative for difficulty urinating, dysuria, frequency and hematuria.   Musculoskeletal:  Negative for arthralgias, back pain and myalgias.  Skin:  Negative for rash.  Neurological:  Negative for dizziness and headaches.  Hematological:  Negative for adenopathy. Does not bruise/bleed easily.  Psychiatric/Behavioral:  Negative for depression and sleep disturbance. The patient is not nervous/anxious.     VITALS:  Blood pressure  (!) 162/104, pulse 79, temperature 98.5 F (36.9 C), resp. rate 16, height 5' 6" (1.676 m), weight 298 lb 8 oz (135.4 kg), SpO2 94 %.  Wt Readings from Last 3 Encounters:  11/26/20 298 lb 8 oz (135.4 kg)  07/27/20 294 lb 4.8 oz (133.5 kg)  04/15/20 284 lb 1.6 oz (128.9 kg)    Body mass index is 48.18 kg/m.  Performance status (ECOG): 1 - Symptomatic but completely ambulatory  PHYSICAL EXAM:  Physical Exam Vitals and nursing note reviewed.  Constitutional:      General: She is not in acute distress.    Appearance: Normal appearance.  HENT:     Head: Normocephalic and atraumatic.     Mouth/Throat:     Mouth: Mucous membranes are moist.     Pharynx: Oropharynx is clear. No oropharyngeal exudate or posterior oropharyngeal erythema.  Eyes:     General: No scleral icterus.    Extraocular Movements: Extraocular movements intact.     Conjunctiva/sclera: Conjunctivae normal.     Pupils: Pupils are equal, round, and reactive to light.  Cardiovascular:     Rate and Rhythm: Normal rate and regular rhythm.     Heart sounds: Normal heart sounds. No murmur heard.   No friction rub. No gallop.  Pulmonary:     Effort: Pulmonary effort is normal.     Breath sounds: Normal breath sounds. No wheezing, rhonchi or rales.    Chest:  Breasts:    Right: No axillary adenopathy or supraclavicular adenopathy.     Left: No axillary adenopathy or supraclavicular adenopathy.     Comments:  There are 2 tiny nodules in the left reconstruction axillary tail.  I cannot appreciate any nodules in the right reconstruction at this time. Abdominal:     General: There is no distension.     Palpations: Abdomen is soft. There is no hepatomegaly, splenomegaly or mass.     Tenderness: There is no abdominal tenderness.  Musculoskeletal:        General: Normal range of motion.     Cervical back: Normal range of motion and neck supple. No tenderness.     Right lower leg: No edema.     Left lower leg: No edema.   Lymphadenopathy:     Cervical: No cervical adenopathy.     Upper Body:     Right upper body: No supraclavicular or axillary adenopathy.     Left upper body: No supraclavicular or axillary adenopathy.     Lower Body: No right inguinal adenopathy. No left inguinal adenopathy.  Skin:    General: Skin is warm and dry.     Coloration: Skin is not jaundiced.     Findings: No rash.  Neurological:     Mental Status: She is alert and oriented to person, place, and time.     Cranial Nerves: No cranial nerve deficit.  Psychiatric:        Mood and Affect: Mood normal.        Behavior: Behavior normal.        Thought Content: Thought content normal.    LABS:   CBC Latest Ref Rng & Units 07/27/2020 07/30/2019 03/12/2019  WBC - 7.4 6.9 4.8  Hemoglobin 12.0 - 16.0 13.9 12.2 12.8  Hematocrit 36 - 46 42 38.1 40.0  Platelets 150 - 399 240 304 104(L)   CMP Latest Ref Rng & Units 07/27/2020 07/30/2019 03/12/2019  Glucose 70 - 99 mg/dL - 118(H) 108(H)  BUN 4 - _0 Creatinine 0.5 - 1.1 0.7 0.78 0.75  Sodium 137 - 147 137 140 139  Potassium 3.4 - 5.3 4.2 3.8 3.9  Chloride 99 - 108 101 103 104  CO2 13 - 22 29(A) 25 23  Calcium 8.7 - 10.7 9.0 9.2 9.2  Total Protein 6.5 - 8.1 g/dL - - 7.0  Total Bilirubin 0.3 - 1.2 mg/dL - - 0.3  Alkaline Phos 25 - 125 99 - 87  AST 13 - 35 21 - 19  ALT 7 - 35 36(A) - 30     No results found for: CEA1 / No results found for: CEA1 No results found for: PSA1 No results found for: RXV400 No results found for: CAN125  No results found for: TOTALPROTELP, ALBUMINELP, A1GS, A2GS, BETS, BETA2SER, GAMS, MSPIKE, SPEI No results found for: TIBC, FERRITIN, IRONPCTSAT No results found for: LDH  STUDIES:  No results found.    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    Cancer ([facility])    breast   Headache    migraines   Hypothyroid    IBS (irritable bowel syndrome)    both IBS-C and IBS-D   Personal  history of chemotherapy    Finished 2/29/21   Sleep apnea    does not use a cpap    Past Surgical History:  Procedure Laterality Date   ABDOMINAL HYSTERECTOMY  2008   vag   AUGMENTATION MAMMAPLASTY Bilateral 08/02/2019   with fat grafting   BREAST EXCISIONAL BIOPSY Right 2014   BREAST LUMPECTOMY WITH NEEDLE LOCALIZATION Right 03/18/2013   Procedure: BREAST LUMPECTOMY WITH NEEDLE LOCALIZATION;  Surgeon: Christian J Streck, MD;  Location: [facility];  Service: General;  Laterality: Right;   BREAST RECONSTRUCTION WITH PLACEMENT OF TISSUE EXPANDER AND ALLODERM Bilateral 01/22/2019   Procedure: BILATERAL BREAST RECONSTRUCTION WITH PLACEMENT OF TISSUE EXPANDER AND ALLODERM;  Surgeon: Thimmappa, Brinda, MD;  Location: [facility];  Service: Plastics;  Laterality: Bilateral;   CHOLECYSTECTOMY  1991   lap choli   COLONOSCOPY  08/24/2016   Colonic polyp status post polypectomy. Small internal hemorrhoids.   LIPOSUCTION WITH LIPOFILLING Bilateral 08/02/2019   Procedure: LIPOFILLING bilateral chest;  Surgeon: Thimmappa, Brinda, MD;  Location: [facility];  Service: Plastics;  Laterality: Bilateral;   MASTECTOMY Bilateral 01/2019   MASTECTOMY W/ SENTINEL NODE BIOPSY Bilateral 01/22/2019   Procedure: BILATERAL SIMPLE MASTECTOMY WITH RIGHT SENTINEL LYMPH NODE MAPPING;  Surgeon: Cornett, Thomas, MD;  Location: [facility];  Service: General;  Laterality: Bilateral;  RNFA REQUESTED   PORT-A-CATH REMOVAL Right 08/02/2019   Procedure: REMOVAL PORT-A-CATH;  Surgeon: Thimmappa, Brinda, MD;  Location: [facility];  Service: Plastics;  Laterality: Right;   PORTACATH PLACEMENT N/A 03/12/2019   Procedure: INSERTION PORT-A-CATH WITH ULTRASOUND;  Surgeon: Cornett, Thomas, MD;  Location: [facility];  Service: General;  Laterality: N/A;   REMOVAL OF BILATERAL TISSUE EXPANDERS WITH PLACEMENT  OF BILATERAL BREAST IMPLANTS Bilateral 08/02/2019   Procedure: REMOVAL OF BILATERAL TISSUE EXPANDERS WITH PLACEMENT OF BILATERAL BREAST IMPLANTS;  Surgeon: Thimmappa, Brinda, MD;  Location: [facility];  Service: Plastics;  Laterality: Bilateral;   SKIN BIOPSY Left 08/02/2019   Procedure: Biopsy Skin;  Surgeon: Thimmappa, Brinda, MD;  Location: [facility];  Service: Plastics;  Laterality: Left;   TUBAL LIGATION  1998    Family History  Problem Relation Age of Onset   Cancer Mother        colon, breast   Breast cancer Mother    Colon cancer Mother 67   Colon polyps Mother 60   Cancer Father        non hodgkins lymphoma   Esophageal cancer Neg Hx    Rectal cancer Neg Hx    Stomach cancer Neg Hx     Social History:  reports that she quit smoking about 4 years ago. She smoked an average of .25 packs per day. She has never used smokeless tobacco. She reports current alcohol use. She reports that she does not use drugs.The patient is alone today.  Allergies:  Allergies  Allergen Reactions   Venlafaxine Shortness Of Breath   Wellbutrin [Bupropion Hcl] Shortness Of Breath   Codeine Nausea And Vomiting   Morphine And Related Itching   Zanaflex [Tizanidine Hydrochloride]     Slurred speech    Current Medications: Current Outpatient Medications  Medication Sig Dispense Refill   celecoxib (CELEBREX) 200 MG capsule Take 200 mg by mouth daily.     celecoxib (CELEBREX) 200 MG capsule TAKE 1 CAPSULE (200 MG TOTAL) BY MOUTH DAILY. 90 capsule 3   celecoxib (CELEBREX) 200 MG capsule Take 1 capsule (200 mg total) by mouth. 90 capsule 3   cyanocobalamin (,VITAMIN B-12,) 1000 MCG/ML   injection Inject 1 ml into the muscle every 30 days 1 mL 10   cyanocobalamin (,VITAMIN B-12,) 1000 MCG/ML injection Inject 1 mL (1,000 mcg total) into the muscle every 30 days. 1 mL 11   cyclobenzaprine (FLEXERIL) 10 MG tablet Take 10 mg by mouth 3 (three) times daily as needed for muscle spasms.     escitalopram (LEXAPRO) 10 MG  tablet Take 10 mg by mouth daily.      escitalopram (LEXAPRO) 10 MG tablet TAKE 1 TABLET BY MOUTH DAILY 90 tablet 0   escitalopram (LEXAPRO) 10 MG tablet TAKE 1 TABLET BY MOUTH DAILY 90 tablet 0   escitalopram (LEXAPRO) 10 MG tablet Take 2 tablets (20 mg total) by mouth daily. 180 tablet 2   esomeprazole (NEXIUM) 40 MG capsule Take 40 mg by mouth daily before breakfast.     esomeprazole (NEXIUM) 40 MG capsule TAKE 1 CAPSULE (40 MG TOTAL) BY MOUTH 2 TIMES DAILY. 180 capsule 1   esomeprazole (NEXIUM) 40 MG capsule Take 1 capsule (40 mg total) by mouth 2 times daily. 180 capsule 3   ibuprofen (ADVIL) 800 MG tablet Take 1 tablet (800 mg total) by mouth every 8 (eight) hours as needed. 30 tablet 0   levothyroxine (SYNTHROID) 175 MCG tablet Take 175 mcg by mouth daily before breakfast.      LINZESS 145 MCG CAPS capsule Take 145 mcg by mouth daily as needed for constipation.     moxifloxacin (VIGAMOX) 0.5 % ophthalmic solution Apply to eye.     rizatriptan (MAXALT) 10 MG tablet Take 10 mg by mouth as needed for migraine. May repeat in 2 hours if needed     rosuvastatin (CRESTOR) 10 MG tablet Take 1 tablet (10 mg total) by mouth daily. 30 tablet 2   Semaglutide-Weight Management 0.25 MG/0.5ML SOAJ Inject 0.5 mls into the skin every 7 days 2 mL 0   SYNTHROID 175 MCG tablet TAKE 1 TABLET (175 MCG TOTAL) BY MOUTH DAILY AT 6 AM. 90 tablet 1   SYNTHROID 175 MCG tablet Take 1 tablet (175 mcg total) by mouth daily at 6am 90 tablet 3   tamoxifen (NOLVADEX) 20 MG tablet TAKE 1 TABLET (20 MG TOTAL) BY MOUTH DAILY. 90 tablet 3   No current facility-administered medications for this visit.     ASSESSMENT & PLAN:   Assessment:   1. Stage IIA hormone receptor positive breast cancer, diagnosed in July 2020.  Due to a high EP Clin score, she received adjuvant TAC chemotherapy, and completed 6 cycles in February.  FSH and estradiol were both well within the postmenopausal phase, so she was recommended for adjuvant  hormonal therapy with an aromatase inhibitor.  She did not tolerate any of the three aromatase inhibitors. Her only other option for endocrine therapy was tamoxifen and she started this in December 2021. She continues to tolerate tamoxifen without significant difficulty.   2.  Status post hysterectomy, but her ovaries are intact.   3. Abnormal liver transaminases, which have remain mildly elevated and have fluctuated up and down in the same range.  A comprehensive metabolic panel in June revealed normal transaminases.    4. Nodules of the bilateral reconstructions felt to be necrotic tissue from her reconstruction.  Most recent ultrasound revealed benign changes.  She continues to follow with Dr. Thimmappa, who plans an annual ultrasound of the reconstructions.    5.  Vitamin-D deficiency for which she is on vitamin D3 2000 international units daily after loading dose of high-dose   vitamin-D  6.  Vitamin B12 deficiency, for which she is on monthly injections.  7.  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  She declines referral to Neurology at this time.    Plan:    I will schedule her bone density scan for September.  We will plan to see her back in 4 months with a CBC and comprehensive metabolic panel for repeat clinical assessment. The patient understands the plans discussed today and is in agreement with them.  She knows to contact our office if she develops concerns prior to her next appointment.     Marvia Pickles, PA-C

## 2020-12-16 ENCOUNTER — Other Ambulatory Visit (HOSPITAL_COMMUNITY): Payer: Self-pay

## 2020-12-16 MED FILL — Tamoxifen Citrate Tab 20 MG (Base Equivalent): ORAL | 90 days supply | Qty: 90 | Fill #1 | Status: CN

## 2020-12-16 MED FILL — Tamoxifen Citrate Tab 20 MG (Base Equivalent): ORAL | 90 days supply | Qty: 90 | Fill #0 | Status: AC

## 2020-12-30 DIAGNOSIS — N3091 Cystitis, unspecified with hematuria: Secondary | ICD-10-CM | POA: Diagnosis not present

## 2021-01-11 ENCOUNTER — Other Ambulatory Visit (HOSPITAL_COMMUNITY): Payer: Self-pay

## 2021-01-25 ENCOUNTER — Other Ambulatory Visit (HOSPITAL_COMMUNITY): Payer: Self-pay

## 2021-01-25 MED FILL — Celecoxib Cap 200 MG: ORAL | 90 days supply | Qty: 90 | Fill #1 | Status: AC

## 2021-02-10 ENCOUNTER — Other Ambulatory Visit (HOSPITAL_COMMUNITY): Payer: Self-pay

## 2021-02-10 MED ORDER — MOUNJARO 2.5 MG/0.5ML ~~LOC~~ SOAJ
SUBCUTANEOUS | 0 refills | Status: DC
  Filled 2021-02-10: qty 2, 28d supply, fill #0

## 2021-02-18 IMAGING — US US BREAST*L* LIMITED INC AXILLA
1 series · 13 of 21 positions shown · non-contrast
Comparison: Previous exam(s).

CLINICAL DATA: 54-year-old female with history of right breast
cancer in 8787 status post bilateral mastectomy with reconstruction
including fat grafting. Patient presents with new lumps in the left
breast.

EXAM:
ULTRASOUND OF THE LEFT BREAST

[Series 1: us breast*left* limited inc axilla · 0.07mm/px · 13 of 21 slices shown]
[im 1/21]
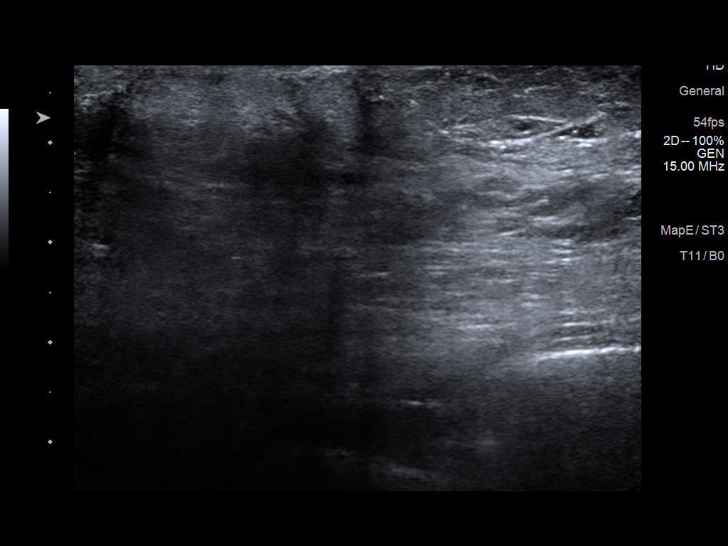
[im 3/21]
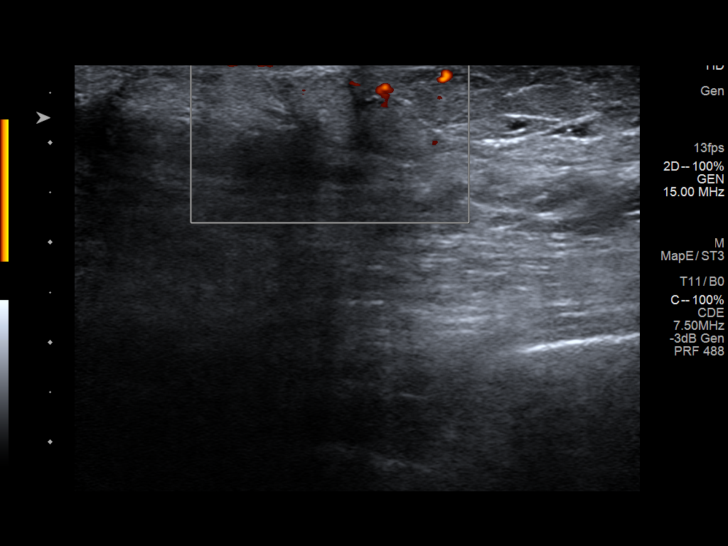
[im 5/21]
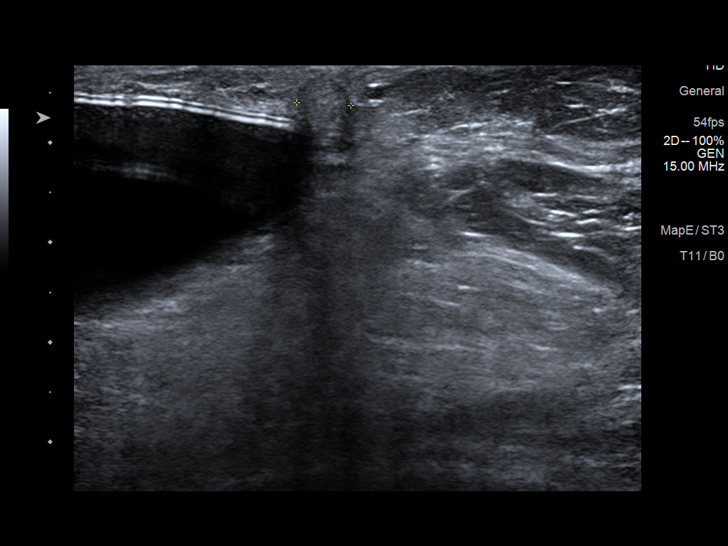
[im 6/21]
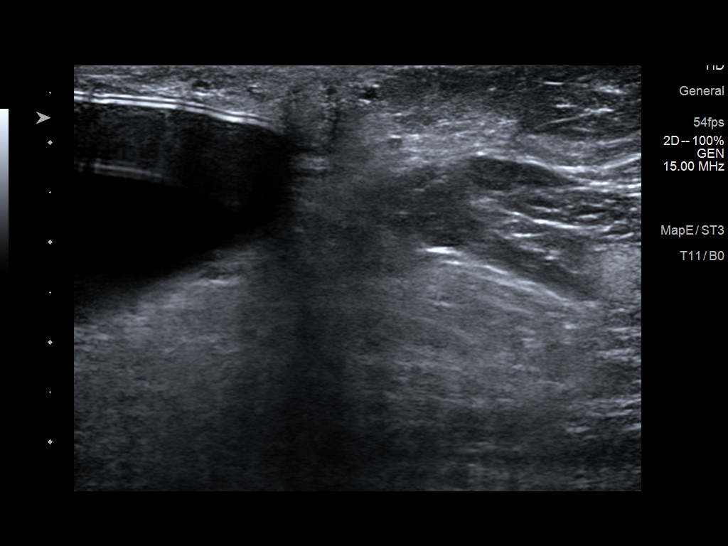
[im 8/21]
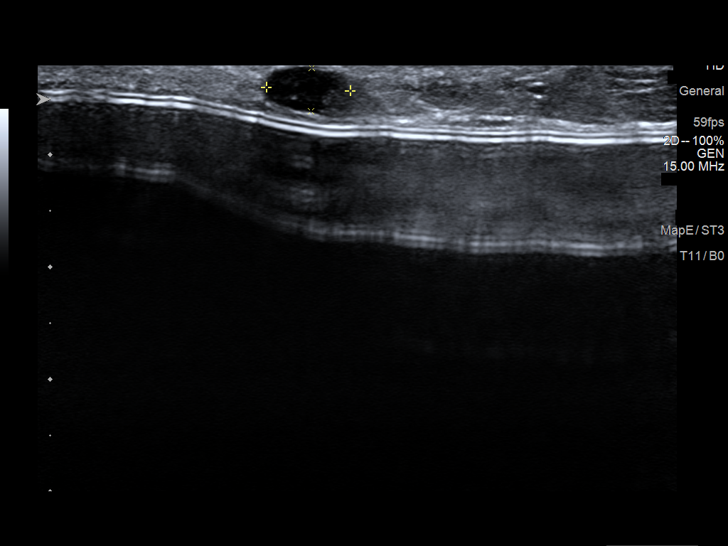
[im 9/21]
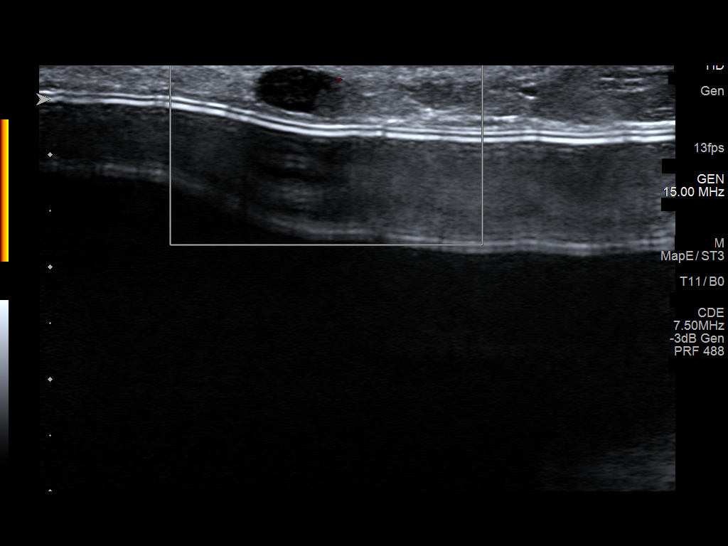
[im 11/21]
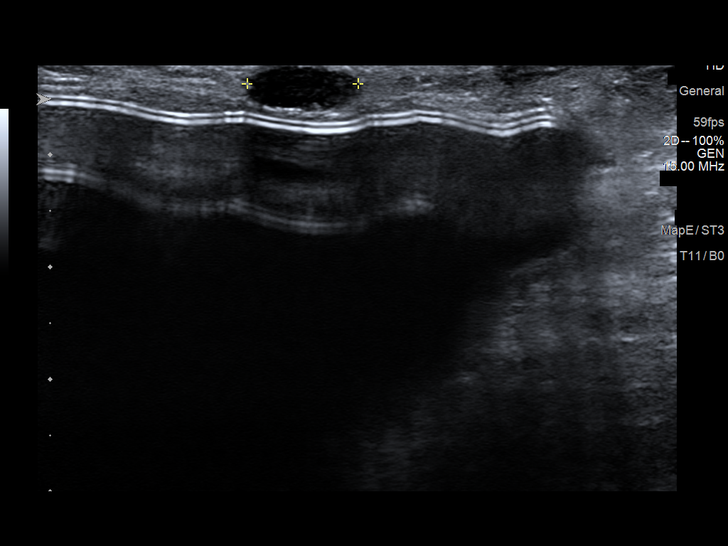
[im 13/21]
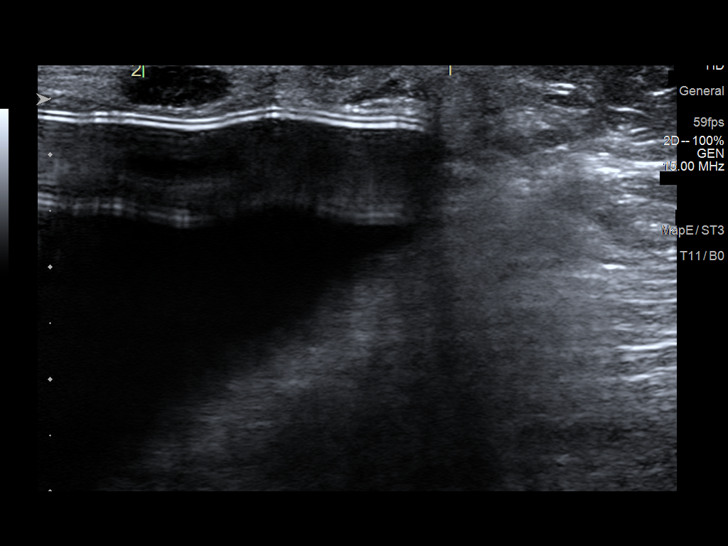
[im 14/21]
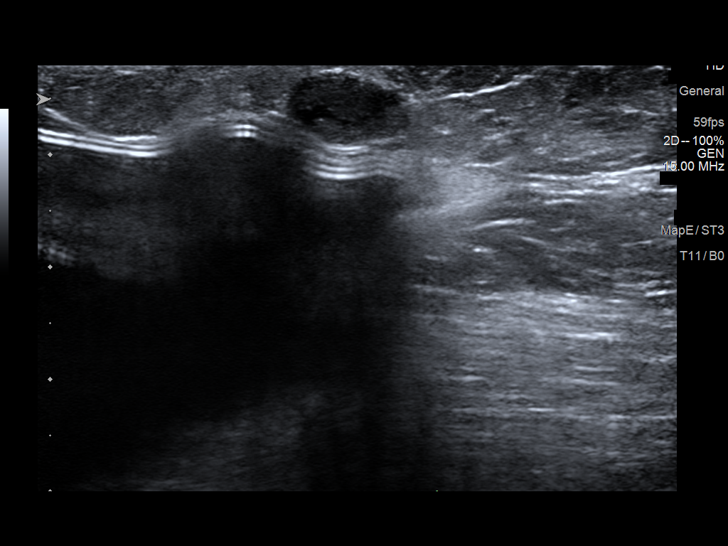
[im 16/21]
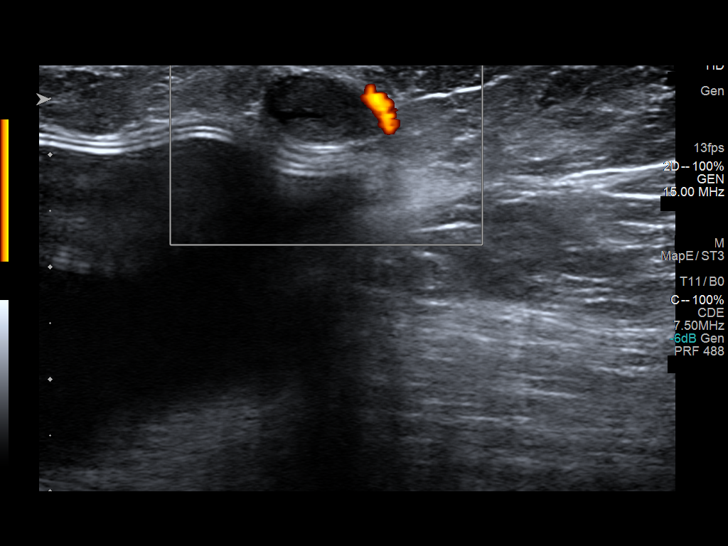
[im 17/21]
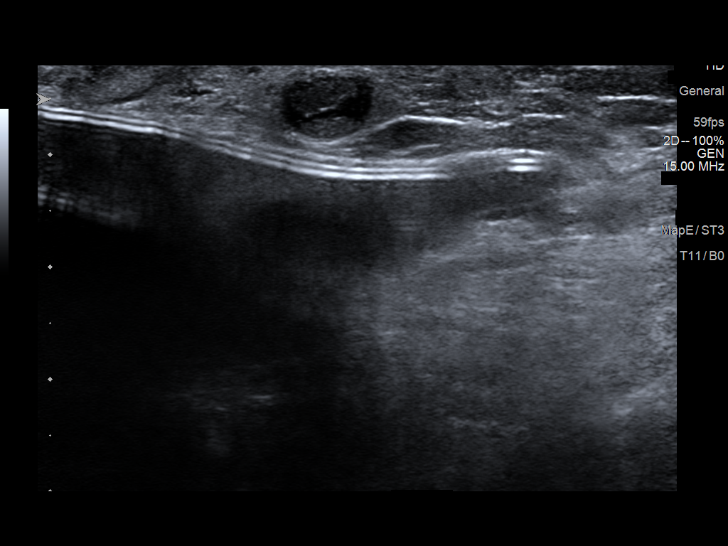
[im 19/21]
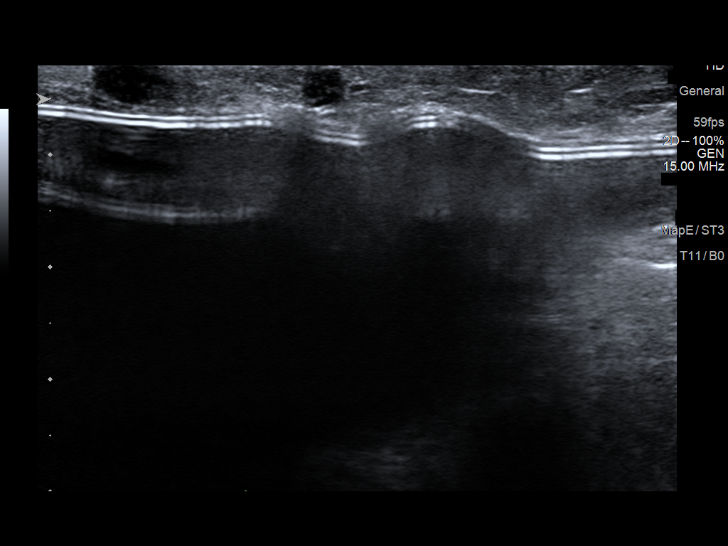
[im 21/21]
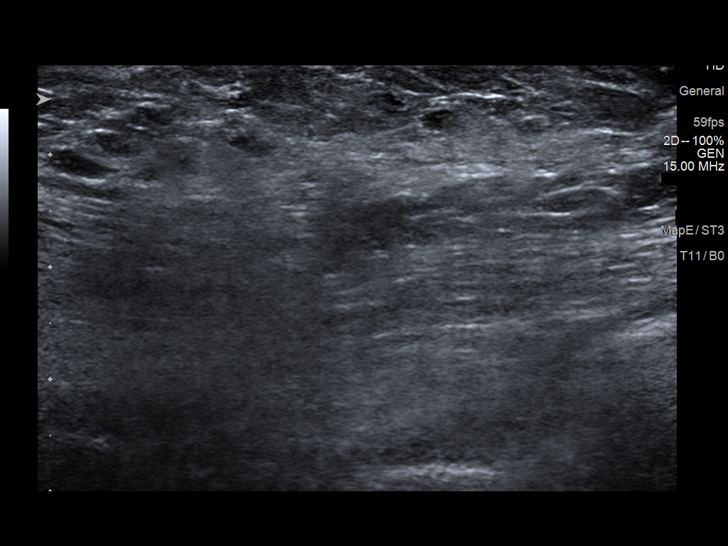

[13 of 21 positions shown; findings below may reference images not displayed]

FINDINGS: On physical exam, I palpate nodularity along the patient's scar in
the upper outer left breast/axillary tail.

Targeted ultrasound is performed in the left breast at 1:30 o'clock
and 2 o'clock demonstrating multiple small fat containing masses
most consistent with fat necrosis. There is an oval circumscribed
predominantly anechoic mass at 1:30 o'clock measuring 0.8 x 0.4 x
1.0 cm. There is a mixed density mass at 2 o'clock measuring 1.1 x
0.7 x 0.8, likely an evolving oil cyst. No suspicious solid mass
identified.
IMPRESSION: At the palpable site of concern in the left breast there are
probably benign masses at 1:30 o'clock and 2 o'clock which most
likely represent postsurgical changes/fat necrosis.

RECOMMENDATION:
Left breast ultrasound in 6 months.

I have discussed the findings and recommendations with the patient.
If applicable, a reminder letter will be sent to the patient
regarding the next appointment.

BI-RADS CATEGORY  3: Probably benign.

## 2021-02-24 DIAGNOSIS — M25532 Pain in left wrist: Secondary | ICD-10-CM | POA: Diagnosis not present

## 2021-02-24 DIAGNOSIS — S0083XA Contusion of other part of head, initial encounter: Secondary | ICD-10-CM | POA: Diagnosis not present

## 2021-02-24 DIAGNOSIS — R519 Headache, unspecified: Secondary | ICD-10-CM | POA: Diagnosis not present

## 2021-02-24 DIAGNOSIS — M19042 Primary osteoarthritis, left hand: Secondary | ICD-10-CM | POA: Diagnosis not present

## 2021-02-24 DIAGNOSIS — S02832A Fracture of medial orbital wall, left side, initial encounter for closed fracture: Secondary | ICD-10-CM | POA: Diagnosis not present

## 2021-02-24 DIAGNOSIS — M79642 Pain in left hand: Secondary | ICD-10-CM | POA: Diagnosis not present

## 2021-03-04 DIAGNOSIS — S0993XA Unspecified injury of face, initial encounter: Secondary | ICD-10-CM | POA: Diagnosis not present

## 2021-03-04 DIAGNOSIS — J342 Deviated nasal septum: Secondary | ICD-10-CM | POA: Diagnosis not present

## 2021-03-04 DIAGNOSIS — W19XXXA Unspecified fall, initial encounter: Secondary | ICD-10-CM | POA: Diagnosis not present

## 2021-03-05 ENCOUNTER — Other Ambulatory Visit (HOSPITAL_COMMUNITY): Payer: Self-pay

## 2021-03-08 ENCOUNTER — Other Ambulatory Visit (HOSPITAL_COMMUNITY): Payer: Self-pay

## 2021-03-08 MED ORDER — MOUNJARO 2.5 MG/0.5ML ~~LOC~~ SOAJ
SUBCUTANEOUS | 0 refills | Status: DC
  Filled 2021-03-08: qty 2, 28d supply, fill #0

## 2021-03-11 ENCOUNTER — Other Ambulatory Visit (HOSPITAL_COMMUNITY): Payer: Self-pay

## 2021-03-11 MED FILL — Tamoxifen Citrate Tab 20 MG (Base Equivalent): ORAL | 90 days supply | Qty: 90 | Fill #1 | Status: AC

## 2021-03-18 NOTE — Progress Notes (Incomplete)
[facility]  [address] [place],  [place]  [id number] [phone]  Clinic Day:  03/18/2021  Referring physician: No ref. provider found  This document serves as a record of services personally performed by [name], MD. It was created on their behalf by Curry,Lauren E, a trained medical scribe. The creation of this record is based on the scribe's personal observations and the provider's statements to them.  CHIEF COMPLAINT:  CC:   Stage II A hormone receptor positive breast cancer  Current Treatment:   Tamoxifen 20 mg daily   HISTORY OF PRESENT ILLNESS:  Natalie Burton is a 55 y.o. 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  She felt a lump in her right breast and underwent a diagnostic mammogram.  This revealed an irregular 3 cm mass in the right breast 2 cm from the nipple at 7 o'clock with associated with pleomorphic microcalcifications.  An ultrasound confirmed a 3.2 cm mass in the 7 oclock position of the right breast with imaging features highly suspicious for malignancy.  The axilla appeared negative.  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  Estrogen and progesterone receptors were positive and HER2 negative.  Ki67 was 40%.  She had a previous history of atypical ductal hyperplasia.  Due to her personal and family history of malignancy, she underwent testing for hereditary cancer syndrome with the [employer] Common Hereditary Cancer panel, which did not did not reveal any clinically significant mutation or variants of uncertain significance.  Bone density scan in September 2020 was normal. She underwent bilateral mastectomies at her choice in September 2020 with placement of saline expanders in preparation for reconstruction.  Pathology revealed a 3.9 cm, grade 3, invasive ductal carcinoma of the right  breast.  4 lymph nodes were negative for metastasis.  Margins were clear.  Left breast pathology was negative.  EndoPredict revealed an EP Clin score of 4.7, which is in the high risk category, and correlates with a 33% risk of distant recurrence within 10 years when treated with 5 years of adjuvant hormonal therapy alone, with an estimated 16% absolute benefit from chemotherapy.  She is also at high risk for late recurrence, so we recommend 10 years of adjuvant hormonal therapy.  She received adjuvant chemotherapy with Taxotere/Adriamycin/Cytoxan (TAC) every 3 weeks for total of 6 cycles.  Echocardiogram prior to chemotherapy revealed normal left ventricular size and function with an ejection fraction of 60-65%.  She started TAC in November 2020. She had mild elevation of the liver transaminases at that time and hepatitis panel was negative and this later normalized.  She also had progressive anemia and intermittent fever, felt to be due to treatment.  She completed 6 cycles of adjuvant TAC chemotherapy in February 2021. She has had a hysterectomy for benign reasons, but her ovaries are intact.   She was placed on anastrozole 1 mg daily in March 2021.  She developed severe muscle and joint pain with anastrozole, which improved off of anastrozole.  Her hormonal therapy was switched to letrozole 2.5 mg daily.  She did not tolerate this either, so she was placed on exemestane.  Unfortunately, she did not tolerate exemestane due to severe fatigue and joint pain and was changed to tamoxifen in December 2021.  She had a left breast reconstruction ultrasound done at the [facility] in mid August due to  nodules of the left reconstruction.  This was felt to be represent necrotic tissue from her reconstruction.  Repeat examination was scheduled in 6 months.  Ultrasound of the bilateral breast reconstructions in February of this year revealed benign fat necrosis changes/oil cysts in the far upper outer right  breast/axillary tail.  There were evolving changes of benign fat necrosis changes/oil cysts within the upper outer left breast. No further imaging follow-up recommended.  INTERVAL HISTORY:  Natalie Burton is here today for repeat clinical assessment.  She states she continues tamoxifen daily without significant difficulty. She has had continued fatigue.  She also reports continued cognitive changes, with difficulty with attention and word-finding.  She reports infrequent night sweats, she otherwise denies hot flashes. She denies any new issues with reconstructions.  She continues to follow with Dr. Iran Planas and states she recommended annual ultrasound of the reconstructions. She had an upper respiratory infection in the spring and was placed on levofloxacin.  Unfortunately, she developed lower extremity tendinitis with this. She denies fevers or chills. She denies pain. Her appetite is good. Her weight has increased 4 pounds over last 4 months . She had labs in June at her primary care provider office.  CBC and comprehensive metabolic panel were normal. Vitamin-D was extremely low at 15.  B12 was low normal at 208. TSH was normal.  She was started on B12 injections monthly and given vitamin D 10,000 international units weekly for 4 weeks and is now on vitamin D3 2000 international units daily.  Natalie Burton is here for routine follow up ***. She continues tamoxifen daily without difficulty.   Her  appetite is good, and she has gained/lost _ pounds since her last visit.  She denies fever, chills or other signs of infection.  She denies nausea, vomiting, bowel issues, or abdominal pain.  She denies sore throat, cough, dyspnea, or chest pain.  REVIEW OF SYSTEMS:  Review of Systems  Constitutional: Negative.  Negative for appetite change, chills, fatigue, fever and unexpected weight change.  HENT:  Negative.    Eyes: Negative.   Respiratory: Negative.  Negative for chest tightness, cough, hemoptysis, shortness of breath  and wheezing.   Cardiovascular: Negative.  Negative for chest pain, leg swelling and palpitations.  Gastrointestinal: Negative.  Negative for abdominal distention, abdominal pain, blood in stool, constipation, diarrhea, nausea and vomiting.  Endocrine: Negative.   Genitourinary: Negative.  Negative for difficulty urinating, dysuria, frequency and hematuria.   Musculoskeletal: Negative.  Negative for arthralgias, back pain, flank pain, gait problem and myalgias.  Skin: Negative.   Neurological: Negative.  Negative for dizziness, extremity weakness, gait problem, headaches, light-headedness, numbness, seizures and speech difficulty.  Hematological: Negative.   Psychiatric/Behavioral: Negative.  Negative for depression and sleep disturbance. The patient is not nervous/anxious.     VITALS:  There were no vitals taken for this visit.  Wt Readings from Last 3 Encounters:  11/26/20 298 lb 8 oz (135.4 kg)  07/27/20 294 lb 4.8 oz (133.5 kg)  04/15/20 284 lb 1.6 oz (128.9 kg)    There is no height or weight on file to calculate BMI.  Performance status (ECOG): 1 - Symptomatic but completely ambulatory  PHYSICAL EXAM:  Physical Exam Constitutional:      General: She is not in acute distress.    Appearance: Normal appearance. She is normal weight.  HENT:     Head: Normocephalic and atraumatic.  Eyes:     General: No scleral icterus.    Extraocular Movements: Extraocular movements intact.  Conjunctiva/sclera: Conjunctivae normal.     Pupils: Pupils are equal, round, and reactive to light.  Cardiovascular:     Rate and Rhythm: Normal rate and regular rhythm.     Pulses: Normal pulses.     Heart sounds: Normal heart sounds. No murmur heard.   No friction rub. No gallop.  Pulmonary:     Effort: Pulmonary effort is normal. No respiratory distress.     Breath sounds: Normal breath sounds.  Abdominal:     General: Bowel sounds are normal. There is no distension.     Palpations: Abdomen is  soft. There is no hepatomegaly, splenomegaly or mass.     Tenderness: There is no abdominal tenderness.  Musculoskeletal:        General: Normal range of motion.     Cervical back: Normal range of motion and neck supple.     Right lower leg: No edema.     Left lower leg: No edema.  Lymphadenopathy:     Cervical: No cervical adenopathy.  Skin:    General: Skin is warm and dry.  Neurological:     General: No focal deficit present.     Mental Status: She is alert and oriented to person, place, and time. Mental status is at baseline.  Psychiatric:        Mood and Affect: Mood normal.        Behavior: Behavior normal.        Thought Content: Thought content normal.        Judgment: Judgment normal.    LABS:   CBC Latest Ref Rng & Units 07/27/2020 07/30/2019 03/12/2019  WBC - 7.4 6.9 4.8  Hemoglobin 12.0 - 16.0 13.9 12.2 12.8  Hematocrit 36 - 46 42 38.1 40.0  Platelets 150 - 399 240 304 104(L)   CMP Latest Ref Rng & Units 07/27/2020 07/30/2019 03/12/2019  Glucose 70 - 99 mg/dL - 118(H) 108(H)  BUN 4 - '21 14 9 8  ' Creatinine 0.5 - 1.1 0.7 0.78 0.75  Sodium 137 - 147 137 140 139  Potassium 3.4 - 5.3 4.2 3.8 3.9  Chloride 99 - 108 101 103 104  CO2 13 - 22 29(A) 25 23  Calcium 8.7 - 10.7 9.0 9.2 9.2  Total Protein 6.5 - 8.1 g/dL - - 7.0  Total Bilirubin 0.3 - 1.2 mg/dL - - 0.3  Alkaline Phos 25 - 125 99 - 87  AST 13 - 35 21 - 19  ALT 7 - 35 36(A) - 30    STUDIES:  No results found.    HISTORY:   Allergies:  Allergies  Allergen Reactions   Venlafaxine Shortness Of Breath   Wellbutrin [Bupropion Hcl] Shortness Of Breath   Codeine Nausea And Vomiting   Morphine And Related Itching   Zanaflex [Tizanidine Hydrochloride]     Slurred speech    Current Medications: Current Outpatient Medications  Medication Sig Dispense Refill   celecoxib (CELEBREX) 200 MG capsule Take 200 mg by mouth daily.     celecoxib (CELEBREX) 200 MG capsule TAKE 1 CAPSULE (200 MG TOTAL) BY MOUTH  DAILY. 90 capsule 3   celecoxib (CELEBREX) 200 MG capsule Take 1 capsule (200 mg total) by mouth. 90 capsule 3   cyanocobalamin (,VITAMIN B-12,) 1000 MCG/ML injection Inject 1 ml into the muscle every 30 days 1 mL 10   cyanocobalamin (,VITAMIN B-12,) 1000 MCG/ML injection Inject 1 mL (1,000 mcg total) into the muscle every 30 days. 1 mL 11   cyclobenzaprine (FLEXERIL)  10 MG tablet Take 10 mg by mouth 3 (three) times daily as needed for muscle spasms.     escitalopram (LEXAPRO) 10 MG tablet Take 10 mg by mouth daily.      escitalopram (LEXAPRO) 10 MG tablet TAKE 1 TABLET BY MOUTH DAILY 90 tablet 0   escitalopram (LEXAPRO) 10 MG tablet TAKE 1 TABLET BY MOUTH DAILY 90 tablet 0   escitalopram (LEXAPRO) 10 MG tablet Take 2 tablets (20 mg total) by mouth daily. 180 tablet 2   esomeprazole (NEXIUM) 40 MG capsule Take 40 mg by mouth daily before breakfast.     esomeprazole (NEXIUM) 40 MG capsule TAKE 1 CAPSULE (40 MG TOTAL) BY MOUTH 2 TIMES DAILY. 180 capsule 1   esomeprazole (NEXIUM) 40 MG capsule Take 1 capsule (40 mg total) by mouth 2 times daily. 180 capsule 3   ibuprofen (ADVIL) 800 MG tablet Take 1 tablet (800 mg total) by mouth every 8 (eight) hours as needed. 30 tablet 0   levothyroxine (SYNTHROID) 175 MCG tablet Take 175 mcg by mouth daily before breakfast.      LINZESS 145 MCG CAPS capsule Take 145 mcg by mouth daily as needed for constipation.     moxifloxacin (VIGAMOX) 0.5 % ophthalmic solution Apply to eye.     rizatriptan (MAXALT) 10 MG tablet Take 10 mg by mouth as needed for migraine. May repeat in 2 hours if needed     rosuvastatin (CRESTOR) 10 MG tablet Take 1 tablet (10 mg total) by mouth daily. 30 tablet 2   Semaglutide-Weight Management 0.25 MG/0.5ML SOAJ Inject 0.5 mls into the skin every 7 days 2 mL 0   SYNTHROID 175 MCG tablet TAKE 1 TABLET (175 MCG TOTAL) BY MOUTH DAILY AT 6 AM. 90 tablet 1   SYNTHROID 175 MCG tablet Take 1 tablet (175 mcg total) by mouth daily at 6am 90 tablet 3    tamoxifen (NOLVADEX) 20 MG tablet TAKE 1 TABLET (20 MG TOTAL) BY MOUTH DAILY. 90 tablet 3   tirzepatide (MOUNJARO) 2.5 MG/0.5ML Pen Inject 2.5 mg (1 pen) into the skin every 7 days for 4 weeks. 2 mL 0   No current facility-administered medications for this visit.     ASSESSMENT & PLAN:   Assessment:  1. Stage IIA hormone receptor positive breast cancer, diagnosed in July 2020.  Due to a high EP Clin score, she received adjuvant TAC chemotherapy, and completed 6 cycles in February.  [place] and estradiol were both well within the postmenopausal phase, so she was recommended for adjuvant hormonal therapy with an aromatase inhibitor.  She did not tolerate any of the three aromatase inhibitors. Her only other option for endocrine therapy was tamoxifen and she started this in December 2021. She continues to tolerate tamoxifen without significant difficulty.   2.  Status post hysterectomy, but her ovaries are intact.   3. Abnormal liver transaminases, which have remain mildly elevated and have fluctuated up and down in the same range.  A comprehensive metabolic panel in June revealed normal transaminases.    4. Nodules of the bilateral reconstructions felt to be necrotic tissue from her reconstruction.  Most recent ultrasound revealed benign changes.  She continues to follow with Dr. Iran Planas, who plans an annual ultrasound of the reconstructions.    5.  Vitamin-D deficiency for which she is on vitamin D3 2000 international units daily after loading dose of high-dose vitamin-D  6.  Vitamin B12 deficiency, for which she is on monthly injections.  7.  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  She declines referral to Neurology at this time.    Plan:     I will schedule her bone density scan for September.  We will plan to see her back in 4 months with a CBC and comprehensive metabolic panel for repeat clinical assessment. The patient understands the plans discussed today and is in  agreement with them.  She knows to contact our office if she develops concerns prior to her next appointment.  I provided *** minutes of face-to-face time during this this encounter and > 50% was spent counseling as documented under my assessment and plan.    I, Rita Ohara, am acting as scribe for Derwood Kaplan, MD  I have reviewed this report as typed by the medical scribe, and it is complete and accurate.

## 2021-03-29 ENCOUNTER — Ambulatory Visit: Payer: Self-pay | Admitting: Oncology

## 2021-03-29 ENCOUNTER — Other Ambulatory Visit: Payer: 59

## 2021-04-02 ENCOUNTER — Other Ambulatory Visit (HOSPITAL_COMMUNITY): Payer: Self-pay

## 2021-04-02 MED ORDER — MOUNJARO 5 MG/0.5ML ~~LOC~~ SOAJ
SUBCUTANEOUS | 0 refills | Status: DC
  Filled 2021-04-02: qty 2, 30d supply, fill #0

## 2021-04-05 NOTE — Progress Notes (Signed)
[facility]  [address] [place],  [place]  [address] [phone]  Clinic Day:  04/06/2021  Referring physician: No ref. provider found  This document serves as a record of services personally performed by [name], MD. It was created on their behalf by Curry,Lauren E, a trained medical scribe. The creation of this record is based on the scribe's personal observations and the provider's statements to them.  CHIEF COMPLAINT:  CC:   Stage II A hormone receptor positive breast cancer  Current Treatment:   Tamoxifen 20 mg daily   HISTORY OF PRESENT ILLNESS:  Natalie Burton is a 55 y.o. 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  She felt a lump in her right breast and underwent a diagnostic mammogram.  This revealed an irregular 3 cm mass in the right breast 2 cm from the nipple at 7 o'clock with associated with pleomorphic microcalcifications.  An ultrasound confirmed a 3.2 cm mass in the 7 o'clock position of the right breast with imaging features highly suspicious for malignancy.  The axilla appeared negative.  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  Estrogen and progesterone receptors were positive and HER2 negative.  Ki67 was 40%.  She had a previous history of atypical ductal hyperplasia.  Due to her personal and family history of malignancy, she underwent testing for hereditary cancer syndrome with the [employer] Common Hereditary Cancer panel, which did not did not reveal any clinically significant mutation or variants of uncertain significance.  Bone density scan in September 2020 was normal. She underwent bilateral mastectomies at her choice in September 2020 with placement of saline expanders in preparation for reconstruction.  Pathology revealed a 3.9 cm, grade 3, invasive ductal carcinoma of the right  breast.  4 lymph nodes were negative for metastasis.  Margins were clear.  Left breast pathology was negative.  EndoPredict revealed an EP Clin score of 4.7, which is in the high risk category, and correlates with a 33% risk of distant recurrence within 10 years when treated with 5 years of adjuvant hormonal therapy alone, with an estimated 16% absolute benefit from chemotherapy.  She is also at high risk for late recurrence, so we recommend 10 years of adjuvant hormonal therapy.  She received adjuvant chemotherapy with Taxotere/Adriamycin/Cytoxan (TAC) every 3 weeks for total of 6 cycles.  Echocardiogram prior to chemotherapy revealed normal left ventricular size and function with an ejection fraction of 60-65%.  She started TAC in November 2020. She had mild elevation of the liver transaminases at that time and hepatitis panel was negative and this later normalized.  She also had progressive anemia and intermittent fever, felt to be due to treatment.  She completed 6 cycles of adjuvant TAC chemotherapy in February 2021. She has had a hysterectomy for benign reasons, but her ovaries are intact.   She was placed on anastrozole 1 mg daily in March 2021.  She developed severe muscle and joint pain with anastrozole, which improved off of anastrozole.  Her hormonal therapy was switched to letrozole 2.5 mg daily.  She did not tolerate this either, so she was placed on exemestane.  Unfortunately, she did not tolerate exemestane due to severe fatigue and joint pain and was changed to tamoxifen in December 2021.  She had a left breast reconstruction ultrasound done at the [facility] in mid August due to  nodules of the left reconstruction.  This was felt to be represent necrotic tissue from her reconstruction.  Repeat examination was scheduled in 6 months.  Ultrasound of the bilateral breast reconstructions in February 2022 revealed benign fat necrosis changes/oil cysts in the far upper outer right  breast/axillary tail.  There were evolving changes of benign fat necrosis changes/oil cysts within the upper outer left breast. She will follow up with the plastic surgeon yearly which will be in March and will have a repeat ultrasound.  INTERVAL HISTORY:  Natalie Burton is here for routine follow up and states that she has been well, other than fighting a cold for about 1 week, but is feeling better. She is scheduled for bone density on March 3rd. Since her last visit, she states that she fractured her left orbital socket due to a fall. Blood counts and chemistries are unremarkable except for a stable SGPT of 47. Her  appetite is good, and she has lost nearly 4 pounds since her last visit.  She denies fever, chills or other signs of infection.  She denies nausea, vomiting, bowel issues, or abdominal pain.  She denies sore throat, cough, dyspnea, or chest pain.  REVIEW OF SYSTEMS:  Review of Systems  Constitutional: Negative.  Negative for appetite change, chills, fatigue, fever and unexpected weight change.  HENT:  Negative.    Eyes: Negative.   Respiratory: Negative.  Negative for chest tightness, cough, hemoptysis, shortness of breath and wheezing.   Cardiovascular: Negative.  Negative for chest pain, leg swelling and palpitations.  Gastrointestinal: Negative.  Negative for abdominal distention, abdominal pain, blood in stool, constipation, diarrhea, nausea and vomiting.  Endocrine: Negative.   Genitourinary: Negative.  Negative for difficulty urinating, dysuria, frequency and hematuria.   Musculoskeletal: Negative.  Negative for arthralgias, back pain, flank pain, gait problem and myalgias.  Skin: Negative.   Neurological: Negative.  Negative for dizziness, extremity weakness, gait problem, headaches, light-headedness, numbness, seizures and speech difficulty.  Hematological: Negative.   Psychiatric/Behavioral: Negative.  Negative for depression and sleep disturbance. The patient is not  nervous/anxious.     VITALS:  Blood pressure (!) 144/89, pulse 77, temperature 98.7 F (37.1 C), temperature source Oral, resp. rate 18, height '5\' 6"'  (1.676 m), weight 294 lb 3.2 oz (133.4 kg), SpO2 95 %.  Wt Readings from Last 3 Encounters:  04/06/21 294 lb 3.2 oz (133.4 kg)  11/26/20 298 lb 8 oz (135.4 kg)  07/27/20 294 lb 4.8 oz (133.5 kg)    Body mass index is 47.49 kg/m.  Performance status (ECOG): 0 - Asymptomatic  PHYSICAL EXAM:  Physical Exam Constitutional:      General: She is not in acute distress.    Appearance: Normal appearance. She is normal weight.  HENT:     Head: Normocephalic and atraumatic.  Eyes:     General: No scleral icterus.    Extraocular Movements: Extraocular movements intact.     Conjunctiva/sclera: Conjunctivae normal.     Pupils: Pupils are equal, round, and reactive to light.  Cardiovascular:     Rate and Rhythm: Normal rate and regular rhythm.     Pulses: Normal pulses.     Heart sounds: Normal heart sounds. No murmur heard.   No friction rub. No gallop.  Pulmonary:     Effort: Pulmonary effort is normal. No respiratory distress.     Breath sounds: Normal breath sounds.  Chest:     Comments: Bilateral reconstructions are negative. Abdominal:     General: Bowel sounds  are normal. There is no distension.     Palpations: Abdomen is soft. There is no hepatomegaly, splenomegaly or mass.     Tenderness: There is no abdominal tenderness.  Musculoskeletal:        General: Normal range of motion.     Cervical back: Normal range of motion and neck supple.     Right lower leg: Edema (trace) present.     Left lower leg: Edema (trace) present.  Lymphadenopathy:     Cervical: No cervical adenopathy.  Skin:    General: Skin is warm and dry.  Neurological:     General: No focal deficit present.     Mental Status: She is alert and oriented to person, place, and time. Mental status is at baseline.  Psychiatric:        Mood and Affect: Mood normal.         Behavior: Behavior normal.        Thought Content: Thought content normal.        Judgment: Judgment normal.    LABS:   CBC Latest Ref Rng & Units 07/27/2020 07/30/2019 03/12/2019  WBC - 7.4 6.9 4.8  Hemoglobin 12.0 - 16.0 13.9 12.2 12.8  Hematocrit 36 - 46 42 38.1 40.0  Platelets 150 - 399 240 304 104(L)   CMP Latest Ref Rng & Units 07/27/2020 07/30/2019 03/12/2019  Glucose 70 - 99 mg/dL - 118(H) 108(H)  BUN 4 - '21 14 9 8  ' Creatinine 0.5 - 1.1 0.7 0.78 0.75  Sodium 137 - 147 137 140 139  Potassium 3.4 - 5.3 4.2 3.8 3.9  Chloride 99 - 108 101 103 104  CO2 13 - 22 29(A) 25 23  Calcium 8.7 - 10.7 9.0 9.2 9.2  Total Protein 6.5 - 8.1 g/dL - - 7.0  Total Bilirubin 0.3 - 1.2 mg/dL - - 0.3  Alkaline Phos 25 - 125 99 - 87  AST 13 - 35 21 - 19  ALT 7 - 35 36(A) - 30    STUDIES:  No results found.    HISTORY:   Allergies:  Allergies  Allergen Reactions   Venlafaxine Shortness Of Breath   Wellbutrin [Bupropion Hcl] Shortness Of Breath   Codeine Nausea And Vomiting   Morphine And Related Itching   Zanaflex [Tizanidine Hydrochloride]     Slurred speech    Current Medications: Current Outpatient Medications  Medication Sig Dispense Refill   celecoxib (CELEBREX) 200 MG capsule Take 1 capsule (200 mg total) by mouth. 90 capsule 3   escitalopram (LEXAPRO) 20 MG tablet Take 20 mg by mouth daily.     esomeprazole (NEXIUM) 40 MG capsule Take 1 capsule (40 mg total) by mouth 2 times daily. 180 capsule 3   levothyroxine (SYNTHROID) 175 MCG tablet Take 175 mcg by mouth daily before breakfast.      LINZESS 145 MCG CAPS capsule Take 145 mcg by mouth daily as needed for constipation.     rizatriptan (MAXALT) 10 MG tablet Take 10 mg by mouth as needed for migraine. May repeat in 2 hours if needed     tamoxifen (NOLVADEX) 20 MG tablet TAKE 1 TABLET (20 MG TOTAL) BY MOUTH DAILY. 90 tablet 3   tirzepatide (MOUNJARO) 5 MG/0.5ML Pen Inject 5 MG into the skin once a week for 4 weeks.  Then  increase dose as directed. 2 mL 0   No current facility-administered medications for this visit.     ASSESSMENT & PLAN:   Assessment:   1.  Stage IIA hormone receptor positive breast cancer, diagnosed in July 2020.  Due to a high EP Clin score, she received adjuvant TAC chemotherapy, and completed 6 cycles in February 2021.  [place] and estradiol were both well within the postmenopausal phase, so she was recommended for adjuvant hormonal therapy with an aromatase inhibitor.  She did not tolerate any of the three aromatase inhibitors. Her only other option for endocrine therapy was tamoxifen and she started this in December 2021. She continues to tolerate tamoxifen without significant difficulty.   2.  Status post hysterectomy, but her ovaries are intact.   3. Abnormal liver transaminases, which have remain mildly elevated and have fluctuated up and down in the same range.  This is likely from fatty liver.   4. Nodules of the bilateral reconstructions felt to be necrotic tissue from her reconstruction.  Most recent ultrasound revealed benign changes.  She continues to follow with Dr. Iran Planas, who plans an annual ultrasound of the reconstructions.    5.  Vitamin-D deficiency for which she is on vitamin D3 2000 international units daily after loading dose of high-dose vitamin-D  6.  Vitamin B12 deficiency, for which she is on monthly injections.  Plan:     She is scheduled for density scan in March 2023.  We will plan to see her back in 4 months for repeat clinical assessment. The patient understands the plans discussed today and is in agreement with them.  She knows to contact our office if she develops concerns prior to her next appointment.  I provided 15 minutes of face-to-face time during this this encounter and > 50% was spent counseling as documented under my assessment and plan.    I, Rita Ohara, am acting as scribe for Derwood Kaplan, MD  I have reviewed this report as typed  by the medical scribe, and it is complete and accurate.

## 2021-04-06 ENCOUNTER — Encounter: Payer: Self-pay | Admitting: Oncology

## 2021-04-06 ENCOUNTER — Telehealth: Payer: Self-pay | Admitting: Oncology

## 2021-04-06 ENCOUNTER — Inpatient Hospital Stay: Payer: 59 | Attending: Oncology | Admitting: Oncology

## 2021-04-06 ENCOUNTER — Inpatient Hospital Stay: Payer: 59

## 2021-04-06 VITALS — BP 144/89 | HR 77 | Temp 98.7°F | Resp 18 | Ht 66.0 in | Wt 294.2 lb

## 2021-04-06 DIAGNOSIS — Z170 Estrogen receptor positive status [ER+]: Secondary | ICD-10-CM | POA: Diagnosis not present

## 2021-04-06 DIAGNOSIS — C50511 Malignant neoplasm of lower-outer quadrant of right female breast: Secondary | ICD-10-CM | POA: Diagnosis not present

## 2021-04-06 LAB — COMPREHENSIVE METABOLIC PANEL
Albumin: 4 (ref 3.5–5.0)
Calcium: 8.7 (ref 8.7–10.7)

## 2021-04-06 LAB — CBC AND DIFFERENTIAL
HCT: 40 (ref 36–46)
Hemoglobin: 13.8 (ref 12.0–16.0)
Neutrophils Absolute: 4.88
Platelets: 225 (ref 150–399)
WBC: 7.5

## 2021-04-06 LAB — HEPATIC FUNCTION PANEL
ALT: 47 — AB (ref 7–35)
AST: 35 (ref 13–35)
Alkaline Phosphatase: 80 (ref 25–125)
Bilirubin, Total: 0.4

## 2021-04-06 LAB — BASIC METABOLIC PANEL
BUN: 13 (ref 4–21)
CO2: 28 — AB (ref 13–22)
Chloride: 105 (ref 99–108)
Creatinine: 0.8 (ref 0.5–1.1)
Glucose: 102
Potassium: 4.3 (ref 3.4–5.3)
Sodium: 141 (ref 137–147)

## 2021-04-06 LAB — CBC: RBC: 4.46 (ref 3.87–5.11)

## 2021-04-06 NOTE — Telephone Encounter (Signed)
Called patient with April 2023 Appt's.  Mailed Appt Calendar

## 2021-04-12 ENCOUNTER — Other Ambulatory Visit (HOSPITAL_COMMUNITY): Payer: Self-pay

## 2021-04-29 ENCOUNTER — Other Ambulatory Visit (HOSPITAL_COMMUNITY): Payer: Self-pay

## 2021-05-03 ENCOUNTER — Other Ambulatory Visit (HOSPITAL_COMMUNITY): Payer: Self-pay

## 2021-05-05 ENCOUNTER — Other Ambulatory Visit (HOSPITAL_COMMUNITY): Payer: Self-pay

## 2021-05-05 MED ORDER — MOUNJARO 7.5 MG/0.5ML ~~LOC~~ SOAJ
SUBCUTANEOUS | 0 refills | Status: DC
  Filled 2021-05-05: qty 2, 28d supply, fill #0

## 2021-05-12 ENCOUNTER — Other Ambulatory Visit (HOSPITAL_COMMUNITY): Payer: Self-pay

## 2021-05-28 DIAGNOSIS — H524 Presbyopia: Secondary | ICD-10-CM | POA: Diagnosis not present

## 2021-05-31 ENCOUNTER — Other Ambulatory Visit (HOSPITAL_COMMUNITY): Payer: Self-pay

## 2021-05-31 ENCOUNTER — Other Ambulatory Visit: Payer: Self-pay | Admitting: Hematology and Oncology

## 2021-05-31 DIAGNOSIS — C50511 Malignant neoplasm of lower-outer quadrant of right female breast: Secondary | ICD-10-CM

## 2021-05-31 DIAGNOSIS — Z170 Estrogen receptor positive status [ER+]: Secondary | ICD-10-CM

## 2021-05-31 MED ORDER — TAMOXIFEN CITRATE 20 MG PO TABS
ORAL_TABLET | Freq: Every day | ORAL | 3 refills | Status: DC
  Filled 2021-05-31: qty 90, 90d supply, fill #0
  Filled 2021-09-23: qty 90, 90d supply, fill #1
  Filled 2021-12-15: qty 90, 90d supply, fill #2
  Filled 2022-03-17: qty 90, 90d supply, fill #3

## 2021-06-01 ENCOUNTER — Other Ambulatory Visit (HOSPITAL_COMMUNITY): Payer: Self-pay

## 2021-06-01 MED ORDER — MOUNJARO 7.5 MG/0.5ML ~~LOC~~ SOAJ
SUBCUTANEOUS | 1 refills | Status: DC
  Filled 2021-06-01: qty 2, 28d supply, fill #0

## 2021-06-14 ENCOUNTER — Other Ambulatory Visit (HOSPITAL_COMMUNITY): Payer: Self-pay

## 2021-06-14 DIAGNOSIS — J209 Acute bronchitis, unspecified: Secondary | ICD-10-CM | POA: Diagnosis not present

## 2021-06-14 DIAGNOSIS — Z6841 Body Mass Index (BMI) 40.0 and over, adult: Secondary | ICD-10-CM | POA: Diagnosis not present

## 2021-06-14 MED ORDER — MOUNJARO 10 MG/0.5ML ~~LOC~~ SOAJ
SUBCUTANEOUS | 0 refills | Status: DC
Start: 2021-06-14 — End: 2021-07-26
  Filled 2021-06-28: qty 2, 28d supply, fill #0

## 2021-06-28 ENCOUNTER — Other Ambulatory Visit (HOSPITAL_COMMUNITY): Payer: Self-pay

## 2021-07-16 ENCOUNTER — Other Ambulatory Visit (HOSPITAL_COMMUNITY): Payer: Self-pay

## 2021-07-17 ENCOUNTER — Other Ambulatory Visit (HOSPITAL_COMMUNITY): Payer: Self-pay

## 2021-07-26 ENCOUNTER — Other Ambulatory Visit (HOSPITAL_COMMUNITY): Payer: Self-pay

## 2021-07-26 MED ORDER — MOUNJARO 10 MG/0.5ML ~~LOC~~ SOAJ
SUBCUTANEOUS | 0 refills | Status: DC
  Filled 2021-07-26: qty 2, 28d supply, fill #0

## 2021-07-27 ENCOUNTER — Other Ambulatory Visit (HOSPITAL_COMMUNITY): Payer: Self-pay

## 2021-08-04 ENCOUNTER — Other Ambulatory Visit (HOSPITAL_COMMUNITY): Payer: Self-pay

## 2021-08-05 ENCOUNTER — Inpatient Hospital Stay: Payer: 59 | Attending: Hematology and Oncology

## 2021-08-05 ENCOUNTER — Other Ambulatory Visit: Payer: Self-pay | Admitting: Hematology and Oncology

## 2021-08-05 ENCOUNTER — Telehealth: Payer: Self-pay | Admitting: Hematology and Oncology

## 2021-08-05 ENCOUNTER — Inpatient Hospital Stay (INDEPENDENT_AMBULATORY_CARE_PROVIDER_SITE_OTHER): Payer: 59 | Admitting: Hematology and Oncology

## 2021-08-05 ENCOUNTER — Encounter: Payer: Self-pay | Admitting: Hematology and Oncology

## 2021-08-05 VITALS — BP 140/98 | HR 64 | Temp 98.2°F | Resp 20 | Ht 66.0 in | Wt 275.0 lb

## 2021-08-05 DIAGNOSIS — Z170 Estrogen receptor positive status [ER+]: Secondary | ICD-10-CM | POA: Insufficient documentation

## 2021-08-05 DIAGNOSIS — C50511 Malignant neoplasm of lower-outer quadrant of right female breast: Secondary | ICD-10-CM | POA: Insufficient documentation

## 2021-08-05 LAB — COMPREHENSIVE METABOLIC PANEL
Albumin: 4 (ref 3.5–5.0)
Calcium: 9.1 (ref 8.7–10.7)

## 2021-08-05 LAB — HEPATIC FUNCTION PANEL
ALT: 41 U/L — AB (ref 7–35)
AST: 27 (ref 13–35)
Alkaline Phosphatase: 64 (ref 25–125)
Bilirubin, Total: 0.5

## 2021-08-05 LAB — CBC: RBC: 4.6 (ref 3.87–5.11)

## 2021-08-05 LAB — BASIC METABOLIC PANEL
BUN: 14 (ref 4–21)
CO2: 27 — AB (ref 13–22)
Chloride: 106 (ref 99–108)
Creatinine: 0.8 (ref 0.5–1.1)
Glucose: 93
Potassium: 3.8 mEq/L (ref 3.5–5.1)
Sodium: 141 (ref 137–147)

## 2021-08-05 LAB — CBC AND DIFFERENTIAL
HCT: 42 (ref 36–46)
Hemoglobin: 13.8 (ref 12.0–16.0)
Neutrophils Absolute: 4.69
Platelets: 224 10*3/uL (ref 150–400)
WBC: 7

## 2021-08-05 NOTE — Progress Notes (Signed)
?Patient Care Team: ?[name] (Inactive) as PCP - General (Internal Medicine) ?Derwood Kaplan, MD as Consulting Physician (Oncology) ?[name], MD as Consulting Physician (Radiation Oncology) ?Erroll Luna, MD as Consulting Physician (General Surgery) ? ?Clinic Day:  08/05/2021 ? ?Referring physician: No ref. provider found ? ?ASSESSMENT & PLAN:  ? ?Assessment & Plan: ?Breast cancer of lower-outer quadrant of right female breast ([place]) ?Stage IIA hormone receptor positive breast cancer, diagnosed in July 2020.  Due to a high EP Clin score, she received adjuvant TAC chemotherapy, and completed 6 cycles in February 2021.  [place] and estradiol were both well within the postmenopausal phase, so she was recommended for adjuvant hormonal therapy with an aromatase inhibitor.  She did not tolerate any of the three aromatase inhibitors. Her only other option for endocrine therapy was tamoxifen and she started this in December 2021. She continues to tolerate tamoxifen without significant difficulty. Nodules of the bilateral reconstructions felt to be necrotic tissue from her reconstruction.  Most recent ultrasound revealed benign changes.  She continues to follow with Dr. Iran Planas, and will see him tomorrow. We will follow up with those notes.   ?  ?  ? ?The patient understands the plans discussed today and is in agreement with them.  She knows to contact our office if she develops concerns prior to her next appointment. ? ? ? ?Melodye Ped, NP  ?[facility] ?[address] ?[place] [address] ?Dept: [phone] ?Dept Fax: [phone]  ? ?Orders Placed This Encounter  ?Procedures  ? IgG, IgA, IgM  ?  Standing Status:   Future  ?  Number of Occurrences:   1  ?  Standing Expiration Date:   08/05/2022  ?  ? ? ?CHIEF COMPLAINT:  ?CC: A 56 year old female with history of breast cancer here for 4 month evaluation ? ?Current  Treatment:  Tamoxifen ? ?INTERVAL HISTORY:  ?Britni is here today for repeat clinical assessment. She denies fevers or chills. She denies pain. Her appetite is good. Her weight has decreased 19 pounds over last 4 months . ? ?I have reviewed the past medical history, past surgical history, social history and family history with the patient and they are unchanged from previous note. ? ?ALLERGIES:  is allergic to venlafaxine, wellbutrin [bupropion hcl], codeine, morphine and related, and zanaflex [tizanidine hydrochloride]. ? ?MEDICATIONS:  ?Current Outpatient Medications  ?Medication Sig Dispense Refill  ? rizatriptan (MAXALT) 5 MG tablet Take 5 mg by mouth.    ? celecoxib (CELEBREX) 200 MG capsule Take 1 capsule (200 mg total) by mouth daily. 90 capsule 3  ? escitalopram (LEXAPRO) 10 MG tablet Take 2 tablets (20 mg total) by mouth daily. (Patient not taking: Reported on 08/05/2021) 180 tablet 2  ? escitalopram (LEXAPRO) 20 MG tablet Take 20 mg by mouth daily.    ? esomeprazole (NEXIUM) 40 MG capsule Take 1 capsule (40 mg total) by mouth 2 times daily. 180 capsule 3  ? levothyroxine (SYNTHROID) 175 MCG tablet Take 175 mcg by mouth daily before breakfast.     ? LINZESS 145 MCG CAPS capsule Take 145 mcg by mouth daily as needed for constipation.    ? rizatriptan (MAXALT) 10 MG tablet Take 10 mg by mouth as needed for migraine. May repeat in 2 hours if needed    ? SYNTHROID 175 MCG tablet Take 1 tablet (175 mcg total) by mouth daily at 6am 90 tablet 3  ? tamoxifen (  NOLVADEX) 20 MG tablet TAKE 1 TABLET (20 MG TOTAL) BY MOUTH DAILY. 90 tablet 3  ? tirzepatide (MOUNJARO) 10 MG/0.5ML Pen Inject 0.5 ml into the skin every 7 days. 2 mL 0  ? ?No current facility-administered medications for this visit.  ? ? ?HISTORY OF PRESENT ILLNESS:  ? ?Oncology History  ?Breast cancer of lower-outer quadrant of right female breast ([place])  ?11/27/2018 Initial Diagnosis  ? Breast cancer of lower-outer quadrant of right female breast ([place]) ?   ?11/27/2018 Cancer Staging  ? Staging form: Breast, AJCC 8th Edition ?- Clinical stage from 11/27/2018: Stage IIA (cT2, cN0(sn), cM0, G3, ER+, PR+, HER2-) - Signed by Derwood Kaplan, MD on 07/27/2020 ?Histopathologic type: Infiltrating duct carcinoma, NOS ?Stage prefix: Initial diagnosis ?Method of lymph node assessment: Sentinel lymph node biopsy ?Nuclear grade: G3 ?Multigene prognostic tests performed: EndoPredict ?Histologic grading system: 3 grade system ?Laterality: Right ?Tumor size (mm): 32 ?Lymph-vascular invasion (LVI): LVI not present (absent)/not identified ?Diagnostic confirmation: Positive histology ?Specimen type: Excision ?Staged by: Managing physician ?Menopausal status: Postmenopausal ?EndoPredict EPclin risk score: 4.7 ?EndoPredict EPclin risk level: High risk ?EndoPredict 10-year percentage risk of distant recurrence (%): 33 ?EndoPredict 10-year risk of distant recurrence: High risk ?Stage used in treatment planning: Yes ?National guidelines used in treatment planning: Yes ?Type of national guideline used in treatment planning: NCCN ?Staging comments: Bilateral mast & reconstruction, TAC x 6, hormonal therapy ? ?  ?  ? ? ?REVIEW OF SYSTEMS:  ? ?Constitutional: Denies fevers, chills or abnormal weight loss ?Eyes: Denies blurriness of vision ?Ears, nose, mouth, throat, and face: Denies mucositis or sore throat ?Respiratory: Denies cough, dyspnea or wheezes ?Cardiovascular: Denies palpitation, chest discomfort or lower extremity swelling ?Gastrointestinal:  Denies nausea, heartburn or change in bowel habits ?Skin: Denies abnormal skin rashes ?Lymphatics: Denies new lymphadenopathy or easy bruising ?Neurological:Denies numbness, tingling or new weaknesses ?Behavioral/Psych: Mood is stable, no new changes  ?All other systems were reviewed with the patient and are negative. ? ? ?VITALS:  ?Blood pressure (!) 140/98, pulse 64, temperature 98.2 ?F (36.8 ?C), temperature source Oral, resp. rate 20,  height _0  (1.676 m), weight 275 lb (124.7 kg), SpO2 96 %.  ?Wt Readings from Last 3 Encounters:  ?08/05/21 275 lb (124.7 kg)  ?04/06/21 294 lb 3.2 oz (133.4 kg)  ?11/26/20 298 lb 8 oz (135.4 kg)  ?  ?Body mass index is 44.39 kg/m?. ? ?Performance status (ECOG): 1 - Symptomatic but completely ambulatory ? ?PHYSICAL EXAM:  ? ?GENERAL:alert, no distress and comfortable ?SKIN: skin color, texture, turgor are normal, no rashes or significant lesions ?EYES: normal, Conjunctiva are pink and non-injected, sclera clear ?OROPHARYNX:no exudate, no erythema and lips, buccal mucosa, and tongue normal  ?NECK: supple, thyroid normal size, non-tender, without nodularity ?LYMPH:  no palpable lymphadenopathy in the cervical, axillary or inguinal ?LUNGS: clear to auscultation and percussion with normal breathing effort ?HEART: regular rate & rhythm and no murmurs and no lower extremity edema ?ABDOMEN:abdomen soft, non-tender and normal bowel sounds ?Musculoskeletal:no cyanosis of digits and no clubbing  ?NEURO: alert & oriented x 3 with fluent speech, no focal motor/sensory deficits ? ?LABORATORY DATA:  ?I have reviewed the data as listed ?   ?Component Value Date/Time  ? NA 141 08/05/2021 0000  ? K 3.8 08/05/2021 0000  ? CL 106 08/05/2021 0000  ? CO2 27 (A) 08/05/2021 0000  ? GLUCOSE 118 (H) 07/30/2019 0901  ? BUN 14 08/05/2021 0000  ? CREATININE 0.8 08/05/2021 0000  ? CREATININE 0.78  07/30/2019 0901  ? CALCIUM 9.1 08/05/2021 0000  ? PROT 7.0 03/12/2019 0801  ? ALBUMIN 4.0 08/05/2021 0000  ? AST 27 08/05/2021 0000  ? ALT 41 (A) 08/05/2021 0000  ? ALKPHOS 64 08/05/2021 0000  ? BILITOT 0.3 03/12/2019 0801  ? GFRNONAA >60 07/30/2019 0901  ? GFRAA >60 07/30/2019 0901  ? ? ?No results found for: SPEP, UPEP ? ?Lab Results  ?Component Value Date  ? WBC 7.0 08/05/2021  ? NEUTROABS 4.69 08/05/2021  ? HGB 13.8 08/05/2021  ? HCT 42 08/05/2021  ? MCV 94.8 07/30/2019  ? PLT 224 08/05/2021  ? ? ?  Chemistry   ?   ?Component Value Date/Time  ?  NA 141 08/05/2021 0000  ? K 3.8 08/05/2021 0000  ? CL 106 08/05/2021 0000  ? CO2 27 (A) 08/05/2021 0000  ? BUN 14 08/05/2021 0000  ? CREATININE 0.8 08/05/2021 0000  ? CREATININE 0.78 07/30/2019 0901  ? GLU 93 04/06/202

## 2021-08-05 NOTE — Assessment & Plan Note (Addendum)
Stage IIA hormone receptor positive breast cancer, diagnosed in July 2020. ?Due to a high EP Clin score, she received adjuvant TAC chemotherapy, and completed 6 cycles in February 2021. ?[place] and estradiol were both well within the postmenopausal phase, so she was recommended for adjuvant hormonal therapy with an aromatase inhibitor. ?She did not tolerate any of the three aromatase inhibitors. Her only?other?option for endocrine therapy?was tamoxifen and she started this in December 2021. She continues to tolerate tamoxifen without significant difficulty. Nodules of the bilateral reconstructions felt to be necrotic tissue from her reconstruction.  Most recent ultrasound revealed benign changes. ?She continues to follow with Dr. Iran Planas, and will see him tomorrow. We will follow up with those notes. ? ?? ?

## 2021-08-05 NOTE — Telephone Encounter (Signed)
Per 08/05/21 los next appt scheduled and confirmed with patient ?

## 2021-08-06 LAB — IGG, IGA, IGM
IgA: 200 mg/dL (ref 87–352)
IgG (Immunoglobin G), Serum: 1001 mg/dL (ref 586–1602)
IgM (Immunoglobulin M), Srm: 72 mg/dL (ref 26–217)

## 2021-08-11 DIAGNOSIS — Z853 Personal history of malignant neoplasm of breast: Secondary | ICD-10-CM | POA: Diagnosis not present

## 2021-08-11 DIAGNOSIS — Z9013 Acquired absence of bilateral breasts and nipples: Secondary | ICD-10-CM | POA: Diagnosis not present

## 2021-08-22 IMAGING — US US BREAST*R* LIMITED INC AXILLA
1 series · 10 of 10 positions shown · non-contrast
Comparison: Previous exam(s).

CLINICAL DATA: 54-year-old female for new palpable lump in the
UPPER OUTER RIGHT breast/axillary tail and six-month follow-up of
probable LEFT breast fat necrosis. History of RIGHT breast cancer
and bilateral mastectomies with reconstructions in 0101.

EXAM:
ULTRASOUND OF THE BILATERAL BREAST

[Series 1: us breast*right* limited inc axilla · 0.06mm/px · 10 of 10 slices shown]
[im 1/10]
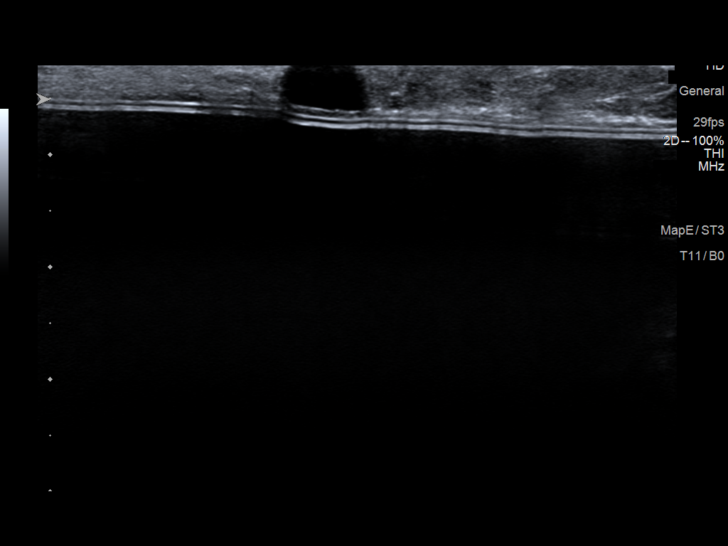
[im 2/10]
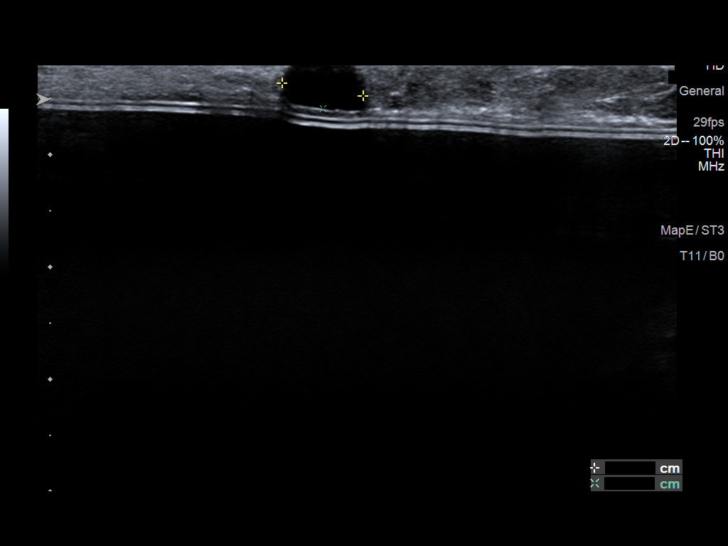
[im 3/10]
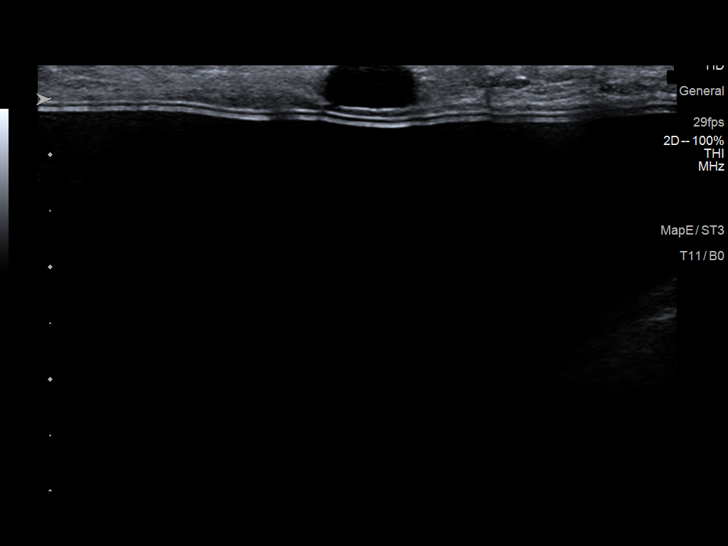
[im 4/10]
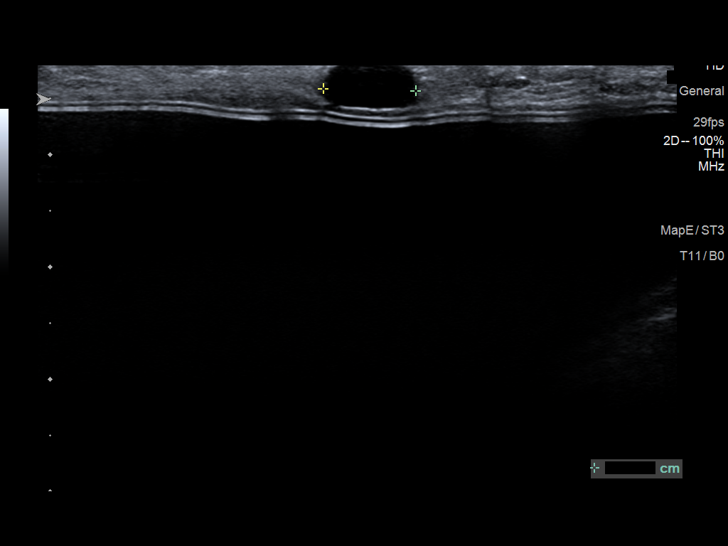
[im 5/10]
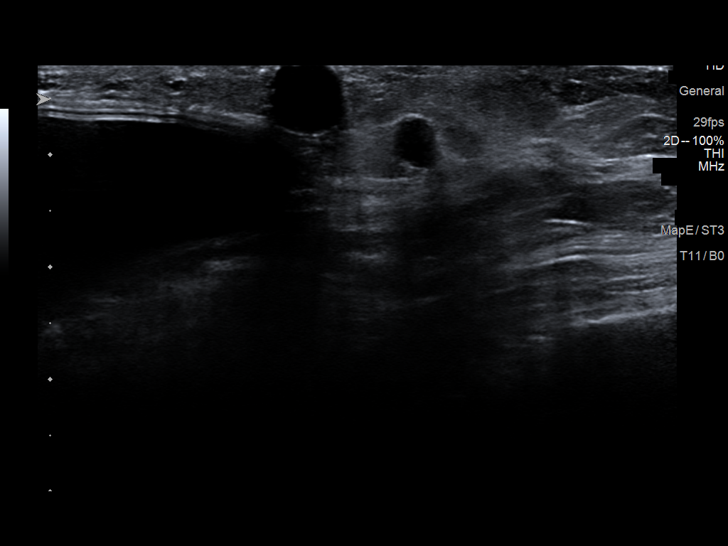
[im 6/10]
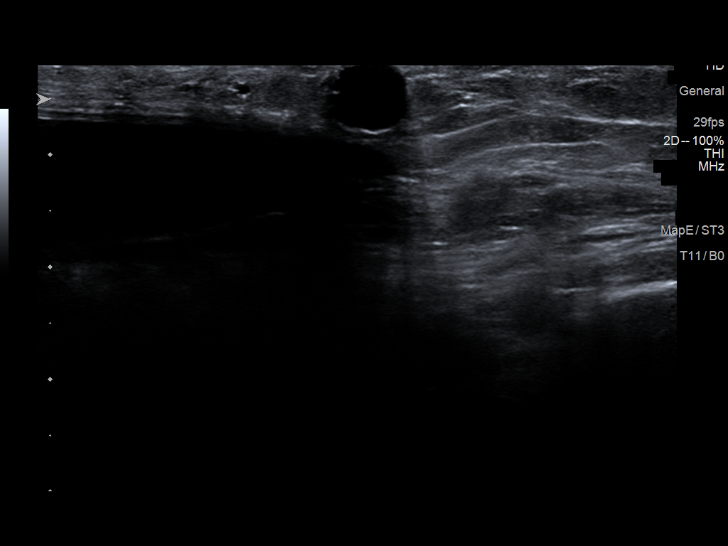
[im 7/10]
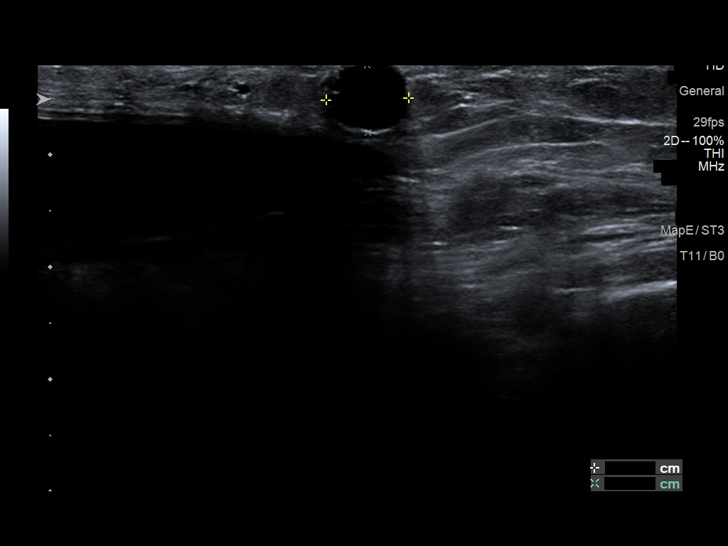
[im 8/10]
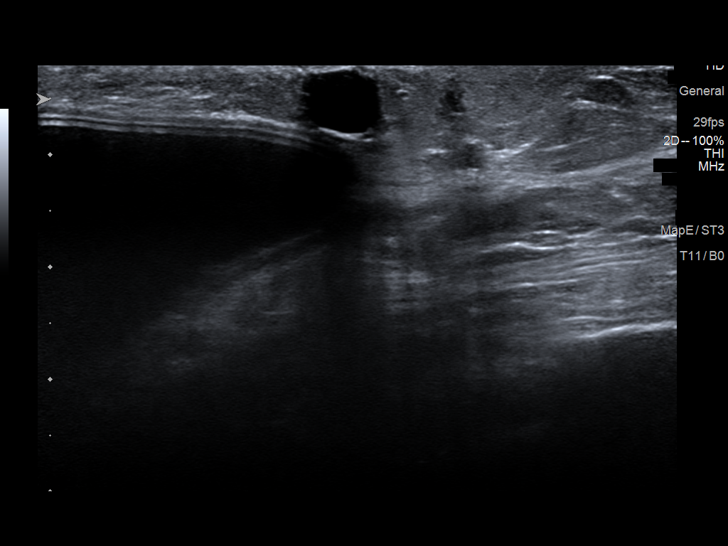
[im 9/10]
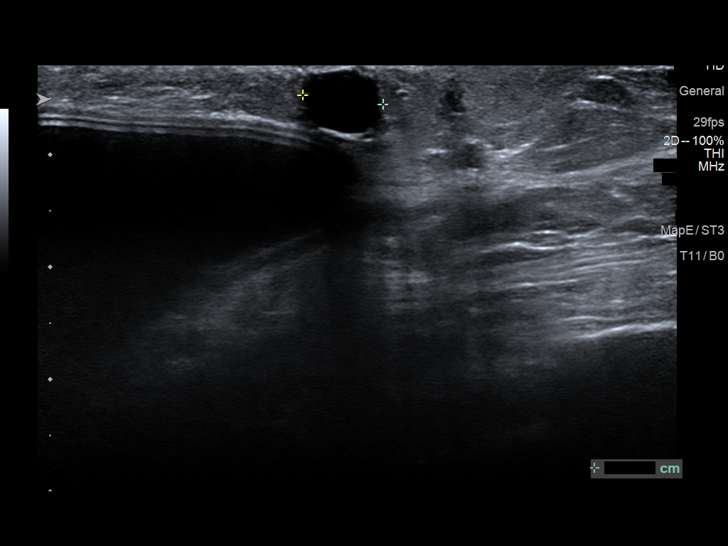
[im 10/10]
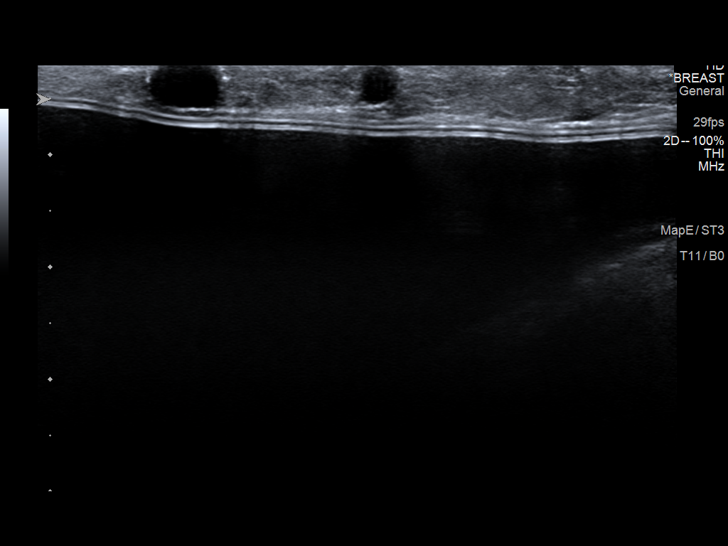

[10 of 10 positions shown; findings below may reference images not displayed]

FINDINGS: On physical exam, palpable thickening at the 11 o'clock position of
the far UPPER-OUTER RIGHT breast/axillary tail noted.

Targeted ultrasound is performed, showing the following:

RIGHT breast:

Fat necrosis changes with oil cysts at the 11 o'clock position of
the far UPPER-OUTER RIGHT breast/axillary tail corresponding to the
patient's palpable abnormality.

LEFT breast:

Evolving benign fat necrosis changes with benign cysts at the [DATE]
to 2 o'clock position of the far UPPER OUTER LEFT breast/axillary
tail
IMPRESSION: 1. Benign fat necrosis changes/oil cysts in the far UPPER-OUTER
RIGHT breast/axillary tail corresponding to the patient's palpable
abnormality.
2. Evolving changes of benign fat necrosis changes/oil cysts within
the UPPER OUTER LEFT breast. No further imaging follow-up
recommended.

RECOMMENDATION:
Clinical follow-up as indicated. Any further workup should be based
on clinical grounds.

I have discussed the findings and recommendations with the patient.
If applicable, a reminder letter will be sent to the patient
regarding the next appointment.

BI-RADS CATEGORY  2: Benign.

## 2021-08-23 ENCOUNTER — Other Ambulatory Visit (HOSPITAL_COMMUNITY): Payer: Self-pay

## 2021-09-21 DIAGNOSIS — D225 Melanocytic nevi of trunk: Secondary | ICD-10-CM | POA: Diagnosis not present

## 2021-09-21 DIAGNOSIS — D485 Neoplasm of uncertain behavior of skin: Secondary | ICD-10-CM | POA: Diagnosis not present

## 2021-09-21 DIAGNOSIS — D2239 Melanocytic nevi of other parts of face: Secondary | ICD-10-CM | POA: Diagnosis not present

## 2021-09-21 DIAGNOSIS — L821 Other seborrheic keratosis: Secondary | ICD-10-CM | POA: Diagnosis not present

## 2021-09-21 DIAGNOSIS — L814 Other melanin hyperpigmentation: Secondary | ICD-10-CM | POA: Diagnosis not present

## 2021-09-23 ENCOUNTER — Other Ambulatory Visit (HOSPITAL_COMMUNITY): Payer: Self-pay

## 2021-10-04 ENCOUNTER — Other Ambulatory Visit (HOSPITAL_COMMUNITY): Payer: Self-pay

## 2021-10-04 MED ORDER — WEGOVY 2.4 MG/0.75ML ~~LOC~~ SOAJ
SUBCUTANEOUS | 0 refills | Status: DC
  Filled 2021-10-04: qty 3, 28d supply, fill #0

## 2021-10-21 ENCOUNTER — Other Ambulatory Visit (HOSPITAL_COMMUNITY): Payer: Self-pay

## 2021-10-22 ENCOUNTER — Other Ambulatory Visit (HOSPITAL_COMMUNITY): Payer: Self-pay

## 2021-10-22 MED ORDER — SYNTHROID 175 MCG PO TABS
ORAL_TABLET | ORAL | 0 refills | Status: DC
  Filled 2021-10-22: qty 90, 90d supply, fill #0

## 2021-10-26 ENCOUNTER — Other Ambulatory Visit (HOSPITAL_COMMUNITY): Payer: Self-pay

## 2021-10-26 ENCOUNTER — Encounter (HOSPITAL_COMMUNITY): Payer: Self-pay | Admitting: Pharmacist

## 2021-10-26 MED ORDER — ESOMEPRAZOLE MAGNESIUM 40 MG PO CPDR
DELAYED_RELEASE_CAPSULE | ORAL | 0 refills | Status: DC
  Filled 2021-10-26: qty 180, 90d supply, fill #0

## 2021-11-19 ENCOUNTER — Other Ambulatory Visit (HOSPITAL_COMMUNITY): Payer: Self-pay

## 2021-11-19 MED ORDER — CELECOXIB 200 MG PO CAPS
ORAL_CAPSULE | ORAL | 0 refills | Status: DC
  Filled 2021-11-19: qty 90, 90d supply, fill #0

## 2021-12-08 ENCOUNTER — Other Ambulatory Visit: Payer: Self-pay | Admitting: Oncology

## 2021-12-08 DIAGNOSIS — C50511 Malignant neoplasm of lower-outer quadrant of right female breast: Secondary | ICD-10-CM

## 2021-12-08 NOTE — Progress Notes (Signed)
Patient Care Team: [name] (Inactive) as PCP - General (Internal Medicine) Derwood Kaplan, MD as Consulting Physician (Oncology) [name], MD as Consulting Physician (Radiation Oncology) Erroll Luna, MD as Consulting Physician (General Surgery)  Clinic Day:  12/09/21  Referring physician: No ref. 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  Due to a high EP Clin score, she received adjuvant TAC chemotherapy, and completed 6 cycles in February 2021.  [place] and estradiol were both well within the postmenopausal phase, so she was recommended for adjuvant hormonal therapy with an aromatase inhibitor.  She did not tolerate any of the three aromatase inhibitors. Her only other option for endocrine therapy was tamoxifen and she started this in December 2021. She continues to tolerate tamoxifen without significant difficulty. Nodules of the bilateral reconstructions felt to be necrotic tissue from her reconstruction.  Most recent ultrasound revealed benign changes.  She continues to follow with Dr. Iran Planas, and will see her tomorrow. We will follow up with those notes.       She knows she is due for colonoscopy next year since she has every 3 years. I will see her in 6 months for re-examinationThe patient understands the plans discussed today and is in agreement with them.  She knows to contact our office if she develops concerns prior to her next appointment.    Derwood Kaplan, MD  [facility] AT [facility] [address] [place] [address] Dept: [phone] Dept Fax: [phone]   Orders Placed This Encounter  Procedures   CBC and differential    This external order was created through the Results Console.   CBC    This external order was  created through the Results Console.   Basic metabolic panel    This external order was created through the Results Console.   Comprehensive metabolic panel    This external order was created through the Results Console.   Hepatic function panel    This external order was created through the Results Console.      CHIEF COMPLAINT:  CC: A 56 year old female with history of breast cancer   Current Treatment:  Tamoxifen  INTERVAL HISTORY:  Natalie Burton is here today for repeat clinical assessment.She knows she is due for colonoscopy next year since she has every 3 years. She did not tolerate any of the three aromatase inhibitors. Her only other option for endocrine therapy was tamoxifen and she started this in December 2021. She continues to tolerate tamoxifen without significant difficulty. Nodules of the bilateral reconstructions felt to be necrotic tissue from her reconstruction.  Most recent ultrasound revealed benign changes.  She continues to follow with Dr. Iran Planas, and will see her tomorrow.  She denies fevers or chills. She denies pain. Her appetite is good. Her weight has decreased 10 pounds over last 4 months . In all, she has lost over 30 pounds in the last year.CBC and CMP are normal.  I have reviewed the past medical history, past surgical history, social history and family history with the patient and they are unchanged from previous note.  ALLERGIES:  is allergic to venlafaxine, wellbutrin [bupropion hcl], codeine, morphine and related, and zanaflex [tizanidine hydrochloride].  MEDICATIONS:  Current Outpatient Medications  Medication Sig Dispense Refill   celecoxib (CELEBREX) 200 MG capsule Take 1 capsule by  mouth daily. 90 capsule 0   escitalopram (LEXAPRO) 10 MG tablet Take 2 tablets (20 mg total) by mouth daily. (Patient not taking: Reported on 08/05/2021) 180 tablet 2   esomeprazole (NEXIUM) 40 MG capsule Take 1 capsule (40 mg total) by mouth 2 times daily. 180 capsule 0    levothyroxine (SYNTHROID) 175 MCG tablet Take 175 mcg by mouth daily before breakfast.      LINZESS 145 MCG CAPS capsule Take 145 mcg by mouth daily as needed for constipation.     rizatriptan (MAXALT) 5 MG tablet Take 5 mg by mouth.     Semaglutide-Weight Management (WEGOVY) 2.4 MG/0.75ML SOAJ Inject 0.75 mLs (2.4 mg total) into the skin every 7 days. 3 mL 0   tamoxifen (NOLVADEX) 20 MG tablet TAKE 1 TABLET (20 MG TOTAL) BY MOUTH DAILY. 90 tablet 3   tirzepatide (MOUNJARO) 10 MG/0.5ML Pen Inject 0.5 ml into the skin every 7 days. 2 mL 0   No current facility-administered medications for this visit.    HISTORY OF PRESENT ILLNESS:   Oncology History  Breast cancer of lower-outer quadrant of right female breast ([place])  11/27/2018 Initial Diagnosis   Breast cancer of lower-outer quadrant of right female breast ([place])   11/27/2018 Cancer Staging   Staging form: Breast, AJCC 8th Edition - Clinical stage from 11/27/2018: Stage IIA (cT2, cN0(sn), cM0, G3, ER+, PR+, HER2-) - Signed by Derwood Kaplan, MD on 07/27/2020 Histopathologic type: Infiltrating duct carcinoma, NOS Stage prefix: Initial diagnosis Method of lymph node assessment: Sentinel lymph node biopsy Nuclear grade: G3 Multigene prognostic tests performed: EndoPredict Histologic grading system: 3 grade system Laterality: Right Tumor size (mm): 32 Lymph-vascular invasion (LVI): LVI not present (absent)/not identified Diagnostic confirmation: Positive histology Specimen type: Excision Staged by: Managing physician Menopausal status: Postmenopausal EndoPredict EPclin risk score: 4.7 EndoPredict EPclin risk level: High risk EndoPredict 10-year percentage risk of distant recurrence (%): 33 EndoPredict 10-year risk of distant recurrence: High risk Stage used in treatment planning: Yes National guidelines used in treatment planning: Yes Type of national guideline used in treatment planning: NCCN Staging comments: Bilateral mast &  reconstruction, TAC x 6, hormonal therapy       REVIEW OF SYSTEMS:   Constitutional: Denies fevers, chills or abnormal weight loss Eyes: Denies blurriness of vision Ears, nose, mouth, throat, and face: Denies mucositis or sore throat Respiratory: Denies cough, dyspnea or wheezes Cardiovascular: Denies palpitation, chest discomfort or lower extremity swelling Gastrointestinal:  Denies nausea, heartburn or change in bowel habits Skin: Denies abnormal skin rashes Lymphatics: Denies new lymphadenopathy or easy bruising Neurological:Denies numbness, tingling or new weaknesses Behavioral/Psych: Mood is stable, no new changes  All other systems were reviewed with the patient and are negative.   VITALS:  Blood pressure (!) 140/92, pulse 63, temperature 98.1 F (36.7 C), temperature source Oral, resp. rate 16, height '5\' 6"'  (1.676 m), weight 265 lb 4.8 oz (120.3 kg), SpO2 96 %.  Wt Readings from Last 3 Encounters:  12/09/21 265 lb 4.8 oz (120.3 kg)  08/05/21 275 lb (124.7 kg)  04/06/21 294 lb 3.2 oz (133.4 kg)    Body mass index is 42.82 kg/m.  Performance status (ECOG): 1 - Symptomatic but completely ambulatory  PHYSICAL EXAM:   GENERAL:alert, no distress and comfortable SKIN: skin color, texture, turgor are normal, no rashes or significant lesions EYES: normal, Conjunctiva are pink and non-injected, sclera clear OROPHARYNX:no exudate, no erythema and lips, buccal mucosa, and tongue normal  NECK: supple, thyroid normal size, non-tender,  without nodularity LYMPH:  no palpable lymphadenopathy in the cervical, axillary or inguinal LUNGS: clear to auscultation and percussion with normal breathing effort HEART: regular rate & rhythm and no murmurs and no lower extremity edema ABDOMEN:abdomen soft, non-tender and normal bowel sounds Musculoskeletal:no cyanosis of digits and no clubbing  NEURO: alert & oriented x 3 with fluent speech, no focal motor/sensory deficits BREASTS: She has  bilateral reconstructions, with a tiny nodule of the upper outer left side And another lateral to the right reconstruction. These are stable  LABORATORY DATA:  I have reviewed the data as listed    Component Value Date/Time   NA 138 12/09/2021 0000   K 4.3 12/09/2021 0000   CL 104 12/09/2021 0000   CO2 26 (A) 12/09/2021 0000   GLUCOSE 118 (H) 07/30/2019 0901   BUN 13 12/09/2021 0000   CREATININE 0.8 12/09/2021 0000   CREATININE 0.78 07/30/2019 0901   CALCIUM 9.5 12/09/2021 0000   PROT 7.0 03/12/2019 0801   ALBUMIN 4.1 12/09/2021 0000   AST 24 12/09/2021 0000   ALT 32 12/09/2021 0000   ALKPHOS 70 12/09/2021 0000   BILITOT 0.3 03/12/2019 0801   GFRNONAA >60 07/30/2019 0901   GFRAA >60 07/30/2019 0901    No results found for: "SPEP", "UPEP"  Lab Results  Component Value Date   WBC 6.6 12/09/2021   NEUTROABS 4.22 12/09/2021   HGB 13.9 12/09/2021   HCT 41 12/09/2021   MCV 94.8 07/30/2019   PLT 219 12/09/2021      Chemistry      Component Value Date/Time   NA 138 12/09/2021 0000   K 4.3 12/09/2021 0000   CL 104 12/09/2021 0000   CO2 26 (A) 12/09/2021 0000   BUN 13 12/09/2021 0000   CREATININE 0.8 12/09/2021 0000   CREATININE 0.78 07/30/2019 0901   GLU 92 12/09/2021 0000      Component Value Date/Time   CALCIUM 9.5 12/09/2021 0000   ALKPHOS 70 12/09/2021 0000   AST 24 12/09/2021 0000   ALT 32 12/09/2021 0000   BILITOT 0.3 03/12/2019 0801       RADIOGRAPHIC STUDIES: EXAM: 06/08/20 ULTRASOUND OF THE BILATERAL BREAST FINDINGS: On physical exam, palpable thickening at the 11 o'clock position of the far UPPER-OUTER RIGHT breast/axillary tail noted.   Targeted ultrasound is performed, showing the following:   RIGHT breast:   Fat necrosis changes with oil cysts at the 11 o'clock position of the far UPPER-OUTER RIGHT breast/axillary tail corresponding to the patient's palpable abnormality.   LEFT breast:   Evolving benign fat necrosis changes with benign  cysts at the 1:30 to 2 o'clock position of the far UPPER OUTER LEFT breast/axillary tail   IMPRESSION: 1. Benign fat necrosis changes/oil cysts in the far UPPER-OUTER RIGHT breast/axillary tail corresponding to the patient's palpable abnormality. 2. Evolving changes of benign fat necrosis changes/oil cysts within the UPPER OUTER LEFT breast. No further imaging follow-up recommended.   RECOMMENDATION: Clinical follow-up as indicated. Any further workup should be based on clinical grounds.   I have discussed the findings and recommendations with the patient. If applicable, a reminder letter will be sent to the patient regarding the next appointment.   BI-RADS CATEGORY  2: Benign.

## 2021-12-09 ENCOUNTER — Inpatient Hospital Stay: Payer: 59

## 2021-12-09 ENCOUNTER — Encounter: Payer: Self-pay | Admitting: Oncology

## 2021-12-09 ENCOUNTER — Inpatient Hospital Stay: Payer: 59 | Attending: Oncology | Admitting: Oncology

## 2021-12-09 VITALS — BP 140/92 | HR 63 | Temp 98.1°F | Resp 16 | Ht 66.0 in | Wt 265.3 lb

## 2021-12-09 DIAGNOSIS — C50511 Malignant neoplasm of lower-outer quadrant of right female breast: Secondary | ICD-10-CM

## 2021-12-09 DIAGNOSIS — Z170 Estrogen receptor positive status [ER+]: Secondary | ICD-10-CM

## 2021-12-09 LAB — HEPATIC FUNCTION PANEL
ALT: 32 U/L (ref 7–35)
AST: 24 (ref 13–35)
Alkaline Phosphatase: 70 (ref 25–125)
Bilirubin, Total: 0.4

## 2021-12-09 LAB — BASIC METABOLIC PANEL
BUN: 13 (ref 4–21)
CO2: 26 — AB (ref 13–22)
Chloride: 104 (ref 99–108)
Creatinine: 0.8 (ref 0.5–1.1)
Glucose: 92
Potassium: 4.3 mEq/L (ref 3.5–5.1)
Sodium: 138 (ref 137–147)

## 2021-12-09 LAB — CBC AND DIFFERENTIAL
HCT: 41 (ref 36–46)
Hemoglobin: 13.9 (ref 12.0–16.0)
Neutrophils Absolute: 4.22
Platelets: 219 10*3/uL (ref 150–400)
WBC: 6.6

## 2021-12-09 LAB — COMPREHENSIVE METABOLIC PANEL
Albumin: 4.1 (ref 3.5–5.0)
Calcium: 9.5 (ref 8.7–10.7)

## 2021-12-09 LAB — CBC: RBC: 4.54 (ref 3.87–5.11)

## 2021-12-15 ENCOUNTER — Other Ambulatory Visit (HOSPITAL_COMMUNITY): Payer: Self-pay

## 2021-12-16 ENCOUNTER — Other Ambulatory Visit (HOSPITAL_COMMUNITY): Payer: Self-pay

## 2021-12-16 MED ORDER — WEGOVY 2.4 MG/0.75ML ~~LOC~~ SOAJ
SUBCUTANEOUS | 0 refills | Status: DC
  Filled 2021-12-16: qty 3, 28d supply, fill #0

## 2021-12-17 ENCOUNTER — Other Ambulatory Visit (HOSPITAL_COMMUNITY): Payer: Self-pay

## 2021-12-20 ENCOUNTER — Other Ambulatory Visit (HOSPITAL_COMMUNITY): Payer: Self-pay

## 2022-01-16 ENCOUNTER — Other Ambulatory Visit (HOSPITAL_COMMUNITY): Payer: Self-pay

## 2022-01-17 ENCOUNTER — Other Ambulatory Visit (HOSPITAL_COMMUNITY): Payer: Self-pay

## 2022-01-18 ENCOUNTER — Other Ambulatory Visit (HOSPITAL_COMMUNITY): Payer: Self-pay

## 2022-01-18 MED ORDER — ESOMEPRAZOLE MAGNESIUM 40 MG PO CPDR
40.0000 mg | DELAYED_RELEASE_CAPSULE | Freq: Two times a day (BID) | ORAL | 0 refills | Status: DC
Start: 2022-01-18 — End: 2022-02-11
  Filled 2022-01-18: qty 180, 90d supply, fill #0

## 2022-01-18 MED ORDER — ESCITALOPRAM OXALATE 10 MG PO TABS
20.0000 mg | ORAL_TABLET | Freq: Every day | ORAL | 0 refills | Status: DC
  Filled 2022-01-18: qty 180, 90d supply, fill #0

## 2022-01-24 ENCOUNTER — Other Ambulatory Visit (HOSPITAL_COMMUNITY): Payer: Self-pay

## 2022-01-25 ENCOUNTER — Other Ambulatory Visit (HOSPITAL_COMMUNITY): Payer: Self-pay

## 2022-01-26 ENCOUNTER — Other Ambulatory Visit (HOSPITAL_COMMUNITY): Payer: Self-pay

## 2022-01-26 MED ORDER — SYNTHROID 175 MCG PO TABS
175.0000 ug | ORAL_TABLET | Freq: Every day | ORAL | 0 refills | Status: DC
  Filled 2022-01-26: qty 90, 90d supply, fill #0

## 2022-02-01 ENCOUNTER — Other Ambulatory Visit (HOSPITAL_COMMUNITY): Payer: Self-pay

## 2022-02-11 ENCOUNTER — Other Ambulatory Visit (HOSPITAL_COMMUNITY): Payer: Self-pay

## 2022-02-11 DIAGNOSIS — R5383 Other fatigue: Secondary | ICD-10-CM | POA: Diagnosis not present

## 2022-02-11 DIAGNOSIS — Z9013 Acquired absence of bilateral breasts and nipples: Secondary | ICD-10-CM | POA: Diagnosis not present

## 2022-02-11 DIAGNOSIS — C50511 Malignant neoplasm of lower-outer quadrant of right female breast: Secondary | ICD-10-CM | POA: Diagnosis not present

## 2022-02-11 DIAGNOSIS — E559 Vitamin D deficiency, unspecified: Secondary | ICD-10-CM | POA: Diagnosis not present

## 2022-02-11 DIAGNOSIS — E039 Hypothyroidism, unspecified: Secondary | ICD-10-CM | POA: Diagnosis not present

## 2022-02-11 DIAGNOSIS — F330 Major depressive disorder, recurrent, mild: Secondary | ICD-10-CM | POA: Diagnosis not present

## 2022-02-11 DIAGNOSIS — G4733 Obstructive sleep apnea (adult) (pediatric): Secondary | ICD-10-CM | POA: Diagnosis not present

## 2022-02-11 DIAGNOSIS — M2550 Pain in unspecified joint: Secondary | ICD-10-CM | POA: Diagnosis not present

## 2022-02-11 DIAGNOSIS — K219 Gastro-esophageal reflux disease without esophagitis: Secondary | ICD-10-CM | POA: Diagnosis not present

## 2022-02-11 DIAGNOSIS — R5381 Other malaise: Secondary | ICD-10-CM | POA: Diagnosis not present

## 2022-02-11 DIAGNOSIS — E782 Mixed hyperlipidemia: Secondary | ICD-10-CM | POA: Diagnosis not present

## 2022-02-11 MED ORDER — ESCITALOPRAM OXALATE 10 MG PO TABS
20.0000 mg | ORAL_TABLET | Freq: Every day | ORAL | 1 refills | Status: AC
  Filled 2022-02-11 – 2022-06-22 (×2): qty 180, 90d supply, fill #0

## 2022-02-11 MED ORDER — RIZATRIPTAN BENZOATE 5 MG PO TABS
5.0000 mg | ORAL_TABLET | ORAL | 1 refills | Status: DC | PRN
  Filled 2022-02-11: qty 10, 30d supply, fill #0
  Filled 2022-03-17: qty 10, 30d supply, fill #1

## 2022-02-11 MED ORDER — CELECOXIB 200 MG PO CAPS
200.0000 mg | ORAL_CAPSULE | Freq: Every day | ORAL | 3 refills | Status: DC
  Filled 2022-02-11: qty 90, 90d supply, fill #0
  Filled 2022-05-23: qty 90, 90d supply, fill #1
  Filled 2022-09-15 – 2022-09-21 (×2): qty 90, 90d supply, fill #2

## 2022-02-11 MED ORDER — WEGOVY 2.4 MG/0.75ML ~~LOC~~ SOAJ
2.4000 mg | SUBCUTANEOUS | 5 refills | Status: DC
  Filled 2022-02-11: qty 3, 28d supply, fill #0
  Filled 2022-03-10: qty 3, 28d supply, fill #1
  Filled 2022-04-19: qty 3, 28d supply, fill #2
  Filled 2022-05-23 – 2022-06-30 (×3): qty 3, 28d supply, fill #3
  Filled 2022-08-01 (×2): qty 3, 28d supply, fill #4
  Filled 2022-10-25: qty 3, 28d supply, fill #5

## 2022-02-11 MED ORDER — ESOMEPRAZOLE MAGNESIUM 40 MG PO CPDR
40.0000 mg | DELAYED_RELEASE_CAPSULE | Freq: Two times a day (BID) | ORAL | 3 refills | Status: DC
  Filled 2022-02-11 – 2022-06-22 (×2): qty 180, 90d supply, fill #0

## 2022-03-10 ENCOUNTER — Other Ambulatory Visit (HOSPITAL_COMMUNITY): Payer: Self-pay

## 2022-03-17 ENCOUNTER — Other Ambulatory Visit (HOSPITAL_COMMUNITY): Payer: Self-pay

## 2022-03-18 ENCOUNTER — Other Ambulatory Visit (HOSPITAL_COMMUNITY): Payer: Self-pay

## 2022-04-19 ENCOUNTER — Other Ambulatory Visit: Payer: Self-pay

## 2022-04-19 ENCOUNTER — Other Ambulatory Visit (HOSPITAL_COMMUNITY): Payer: Self-pay

## 2022-04-19 MED ORDER — SYNTHROID 175 MCG PO TABS
175.0000 ug | ORAL_TABLET | Freq: Every day | ORAL | 1 refills | Status: DC
  Filled 2022-04-19: qty 90, 90d supply, fill #0
  Filled 2022-07-19: qty 90, 90d supply, fill #1

## 2022-04-28 ENCOUNTER — Other Ambulatory Visit (HOSPITAL_COMMUNITY): Payer: Self-pay

## 2022-05-23 ENCOUNTER — Encounter (HOSPITAL_COMMUNITY): Payer: Self-pay

## 2022-05-23 ENCOUNTER — Other Ambulatory Visit (HOSPITAL_COMMUNITY): Payer: Self-pay

## 2022-05-31 ENCOUNTER — Encounter (HOSPITAL_COMMUNITY): Payer: Self-pay

## 2022-05-31 ENCOUNTER — Other Ambulatory Visit: Payer: Self-pay

## 2022-05-31 ENCOUNTER — Other Ambulatory Visit (HOSPITAL_COMMUNITY): Payer: Self-pay

## 2022-06-06 NOTE — Progress Notes (Signed)
Patient Care Team: [name] (Inactive) as PCP - General (Internal Medicine) Derwood Kaplan, MD as Consulting Physician (Oncology) [name], MD as Consulting Physician (Radiation Oncology) Erroll Luna, MD as Consulting Physician (General Surgery)  Clinic Day:  06/08/22   Referring physician: No ref. 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  Due to a high EP Clin score, she received adjuvant TAC chemotherapy, and completed 6 cycles in February 2021.  [place] and estradiol were both well within the postmenopausal phase, so she was recommended for adjuvant hormonal therapy with an aromatase inhibitor.  She did not tolerate any of the three aromatase inhibitors. Her only other option for endocrine therapy was tamoxifen and she started this in December 2021. She continues to tolerate tamoxifen without significant difficulty.   Nodules of the bilateral reconstructions  These are felt to be necrotic tissue from her reconstruction.  Most recent ultrasound revealed benign changes, and I feel 3 stable nodules today. She continues to follow with Dr. Iran Planas, and will see her tomorrow. We will follow up with those notes.      Plan She is taking tamoxifen and is tolerating it well. She will schedule a colonoscopy in 6 months. Her labs today are pending. I will see her back in 6 months  for repeat examination as long as her labs today are normal. The patient understands the plans discussed today and is in agreement with them.  She knows to contact our office if she develops concerns prior to her next appointment.  I provided 10 minutes of face-to-face time during this this encounter and > 50% was spent counseling as documented under my assessment and plan.   Derwood Kaplan, MD  [facility] [address] [place] [address] Dept: [phone] Dept Fax: [phone]   No orders of the defined types were placed in this encounter.     CHIEF COMPLAINT:  CC: A 57 year old female with history of breast cancer   Current Treatment:  Tamoxifen  INTERVAL HISTORY:  Natalie Burton is here today for repeat clinical assessment for her history of breast cancer. She was diagnosed in July, 2020. Patient states that she is well and has no complaints. She is taking tamoxifen and is tolerating it well. She will schedule a colonoscopy in 6 months. Her labs today are pending. I will see her back in 6 months  for repeat examination as long as her labs today are normal. The 3 nodules of her reconstructions are stable.  She denies signs of infection such as sore throat, sinus drainage, cough, or urinary symptoms.  She denies fevers or recurrent chills. She denies pain. She denies nausea, vomiting, chest pain, dyspnea or cough. Her appetite is good and her weight purposely has decreased 24 pounds over last 6 months . This has been intentional weight loss.  I have reviewed the past medical history, past surgical history, social history and family history with the patient and they are unchanged from previous note.  ALLERGIES:  is allergic to venlafaxine, wellbutrin [bupropion hcl], codeine, morphine and related, and zanaflex [tizanidine hydrochloride].  MEDICATIONS:  Current Outpatient Medications  Medication Sig Dispense Refill   escitalopram (LEXAPRO) 10 MG tablet Take 2 tablets (20 mg total) by mouth daily. 180 tablet 1   esomeprazole (NEXIUM) 40 MG capsule  Take 1 capsule (40 mg total) by mouth 2 (two) times daily. 180 capsule 3   Semaglutide-Weight Management (WEGOVY) 2.4 MG/0.75ML SOAJ Inject 2.4 mg into the skin once a week. 3 mL 5   SYNTHROID 175 MCG tablet Take 1 tablet (175 mcg total) by mouth daily at 6 a.m. 90 tablet 1   tamoxifen (NOLVADEX) 20 MG  tablet TAKE 1 TABLET (20 MG TOTAL) BY MOUTH DAILY. 90 tablet 3   celecoxib (CELEBREX) 200 MG capsule Take 1 capsule (200 mg total) by mouth daily. (Patient not taking: Reported on 06/08/2022) 90 capsule 3   LINZESS 145 MCG CAPS capsule Take 145 mcg by mouth daily as needed for constipation.     rizatriptan (MAXALT) 5 MG tablet Take 1 tablet (5 mg total) by mouth as needed for migraine. May repeat in 2 hours if needed. 10 tablet 1   No current facility-administered medications for this visit.    HISTORY OF PRESENT ILLNESS:   Oncology History  Breast cancer of lower-outer quadrant of right female breast ([place])  11/27/2018 Initial Diagnosis   Breast cancer of lower-outer quadrant of right female breast ([place])   11/27/2018 Cancer Staging   Staging form: Breast, AJCC 8th Edition - Clinical stage from 11/27/2018: Stage IIA (cT2, cN0(sn), cM0, G3, ER+, PR+, HER2-) - Signed by Derwood Kaplan, MD on 07/27/2020 Histopathologic type: Infiltrating duct carcinoma, NOS Stage prefix: Initial diagnosis Method of lymph node assessment: Sentinel lymph node biopsy Nuclear grade: G3 Multigene prognostic tests performed: EndoPredict Histologic grading system: 3 grade system Laterality: Right Tumor size (mm): 32 Lymph-vascular invasion (LVI): LVI not present (absent)/not identified Diagnostic confirmation: Positive histology Specimen type: Excision Staged by: Managing physician Menopausal status: Postmenopausal EndoPredict EPclin risk score: 4.7 EndoPredict EPclin risk level: High risk EndoPredict 10-year percentage risk of distant recurrence (%): 33 EndoPredict 10-year risk of distant recurrence: High risk Stage used in treatment planning: Yes National guidelines used in treatment planning: Yes Type of national guideline used in treatment planning: NCCN Staging comments: Bilateral mast & reconstruction, TAC x 6, hormonal therapy       REVIEW OF SYSTEMS:  Review of Systems  Constitutional:  Negative.  Negative for appetite change, chills, diaphoresis, fatigue, fever and unexpected weight change.  HENT:  Negative.  Negative for hearing loss, lump/mass, mouth sores, nosebleeds, sore throat, tinnitus, trouble swallowing and voice change.   Eyes: Negative.  Negative for eye problems and icterus.  Respiratory: Negative.  Negative for chest tightness, cough, hemoptysis, shortness of breath and wheezing.   Cardiovascular: Negative.  Negative for chest pain, leg swelling and palpitations.  Gastrointestinal: Negative.  Negative for abdominal distention, abdominal pain, blood in stool, constipation, diarrhea, nausea, rectal pain and vomiting.  Endocrine: Negative.   Genitourinary: Negative.  Negative for bladder incontinence, difficulty urinating, dyspareunia, dysuria, frequency, hematuria, menstrual problem, nocturia, pelvic pain, vaginal bleeding and vaginal discharge.   Musculoskeletal:  Negative for arthralgias, back pain, flank pain, gait problem, myalgias, neck pain and neck stiffness.  Skin: Negative.  Negative for itching, rash and wound.  Neurological: Negative.  Negative for dizziness, extremity weakness, gait problem, headaches, light-headedness, numbness, seizures and speech difficulty.  Hematological: Negative.  Negative for adenopathy. Does not bruise/bleed easily.  Psychiatric/Behavioral: Negative.  Negative for confusion, decreased concentration, depression, sleep disturbance and suicidal ideas. The patient is not nervous/anxious.   All other systems reviewed and are negative.   VITALS:  Blood pressure (!) 143/93, pulse 76, temperature 98.8 F (37.1 C), temperature source Oral, resp. rate 17,  height 5' 6"$  (1.676 m), weight 241 lb 4.8 oz (109.5 kg), SpO2 99 %.  Wt Readings from Last 3 Encounters:  06/08/22 241 lb 4.8 oz (109.5 kg)  12/09/21 265 lb 4.8 oz (120.3 kg)  08/05/21 275 lb (124.7 kg)    Body mass index is 38.95 kg/m.  Performance status (ECOG): 1 - Symptomatic  but completely ambulatory  PHYSICAL EXAM:  Physical Exam Vitals and nursing note reviewed.  Constitutional:      General: She is not in acute distress.    Appearance: Normal appearance. She is normal weight. She is not ill-appearing, toxic-appearing or diaphoretic.  HENT:     Head: Normocephalic and atraumatic.     Right Ear: Tympanic membrane, ear canal and external ear normal. There is no impacted cerumen.     Left Ear: Tympanic membrane, ear canal and external ear normal. There is no impacted cerumen.     Nose: Nose normal. No congestion or rhinorrhea.     Mouth/Throat:     Mouth: Mucous membranes are moist.     Pharynx: Oropharynx is clear. No oropharyngeal exudate or posterior oropharyngeal erythema.  Eyes:     General: No scleral icterus.       Right eye: No discharge.        Left eye: No discharge.     Extraocular Movements: Extraocular movements intact.     Conjunctiva/sclera: Conjunctivae normal.     Pupils: Pupils are equal, round, and reactive to light.  Neck:     Vascular: No carotid bruit.  Cardiovascular:     Rate and Rhythm: Normal rate and regular rhythm.     Pulses: Normal pulses.     Heart sounds: Normal heart sounds. No murmur heard.    No friction rub. No gallop.  Pulmonary:     Effort: Pulmonary effort is normal. No respiratory distress.     Breath sounds: Normal breath sounds. No stridor. No wheezing, rhonchi or rales.  Chest:     Chest wall: No tenderness.     Comments: Tiny less than 1cm nodule in the upper outer quadrant of the left reconstruction Another less than 1cm in the inferior right breast at about 6 o'clock. Another 1cm in the upper outer quadrant at the right reconstruction.  Bilateral reconstructions are negative.  Abdominal:     General: Bowel sounds are normal. There is no distension.     Palpations: Abdomen is soft. There is no hepatomegaly, splenomegaly or mass.     Tenderness: There is no abdominal tenderness. There is no right CVA  tenderness, left CVA tenderness, guarding or rebound.     Hernia: No hernia is present.  Musculoskeletal:        General: No swelling, tenderness, deformity or signs of injury. Normal range of motion.     Cervical back: Normal range of motion and neck supple. No rigidity or tenderness.     Right lower leg: No edema.     Left lower leg: No edema.  Lymphadenopathy:     Cervical: No cervical adenopathy.     Right cervical: No superficial, deep or posterior cervical adenopathy.    Left cervical: No superficial, deep or posterior cervical adenopathy.     Upper Body:     Right upper body: No supraclavicular, axillary or pectoral adenopathy.     Left upper body: No supraclavicular, axillary or pectoral adenopathy.  Skin:    General: Skin is warm and dry.     Coloration: Skin is not jaundiced or  pale.     Findings: No bruising, erythema, lesion or rash.  Neurological:     General: No focal deficit present.     Mental Status: She is alert and oriented to person, place, and time. Mental status is at baseline.     Cranial Nerves: No cranial nerve deficit.     Sensory: No sensory deficit.     Motor: No weakness.     Coordination: Coordination normal.     Gait: Gait normal.     Deep Tendon Reflexes: Reflexes normal.  Psychiatric:        Mood and Affect: Mood normal.        Behavior: Behavior normal.        Thought Content: Thought content normal.        Judgment: Judgment normal.      LABORATORY DATA:  I have reviewed the data as listed  Ref Range & Units 3 mo ago 02/11/2022    WBC 4.4 - 11.0 x 10*3/uL 6.5  RBC 4.10 - 5.10 x 10*6/uL 4.73  Hemoglobin 12.3 - 15.3 G/DL 14.9  Hematocrit 35.9 - 44.6 % 43.8  MCV 80.0 - 96.0 FL 92.4  MCH 27.5 - 33.2 PG 31.4  MCHC 33.0 - 37.0 G/DL 34.0  RDW 12.3 - 17.0 % 12.9  Platelets 150 - 450 X 10*3/uL 232  Comment: Checked  MPV 6.8 - 10.2 FL 10.1   Ref Range & Units 3 mo ago 02/11/2022    Sodium 135 - 146 MMOL/L 142  Potassium 3.5 - 5.3 MMOL/L  4.6  Comment: NO VISIBLE HEMOLYSIS  Chloride 98 - 110 MMOL/L 105  CO2 21 - 31 MMOL/L 29  BUN 8 - 24 MG/DL 14  Glucose 70 - 99 MG/DL 94  Creatinine 0.60 - 1.20 MG/DL 1.03  Calcium 8.5 - 10.5 MG/DL 10.0  Total Protein 6.4 - 8.9 G/DL 7.0  Albumin 3.5 - 5.7 G/DL 4.4  Total Bilirubin 0.0 - 1.0 MG/DL 0.4  Alkaline Phosphatase 34 - 104 IU/L or U/L 67  AST (SGOT) 13 - 39 IU/L or U/L 15  ALT (SGPT) 7 - 52 IU/L or U/L 24  Anion Gap 4 - 14 MMOL/L 8  Est. GFR >=60 ML/MIN/1.73 M*2 64   Ref Range & Units 3 mo ago 02/11/2022  LDL Direct <100 mg/dL 179 High   Total Cholesterol <200 MG/DL 258 High   Triglycerides <150 MG/DL 213 High   HDL Cholesterol >=60 MG/DL 45 Low   Total Chol / HDL Cholesterol <4.5 5.7 High   Non-HDL Cholesterol MG/DL 213    Ref Range & Units 3 mo ago 02/11/2022    TSH 0.45 - 5.00 UIU/ML 0.44 Low     Ref Range & Units 3 mo ago 02/11/2022    HEMOGLOBIN A1C <5.8 % 5.3  eAG MG/DL 105      Component Value Date/Time   NA 139 06/08/2022 1437   NA 138 12/09/2021 0000   K 3.5 06/08/2022 1437   CL 102 06/08/2022 1437   CO2 26 06/08/2022 1437   GLUCOSE 104 (H) 06/08/2022 1437   BUN 11 06/08/2022 1437   BUN 13 12/09/2021 0000   CREATININE 0.89 06/08/2022 1437   CALCIUM 9.1 06/08/2022 1437   PROT 7.0 06/08/2022 1437   ALBUMIN 3.8 06/08/2022 1437   AST 16 06/08/2022 1437   ALT 28 06/08/2022 1437   ALKPHOS 51 06/08/2022 1437   BILITOT 0.5 06/08/2022 1437   GFRNONAA >60 06/08/2022 1437   GFRAA >60 07/30/2019 0901  No results found for: "SPEP", "UPEP"  Lab Results  Component Value Date   WBC 7.1 06/08/2022   NEUTROABS 4.5 06/08/2022   HGB 13.7 06/08/2022   HCT 41.0 06/08/2022   MCV 93.4 06/08/2022   PLT 264 06/08/2022      Chemistry      Component Value Date/Time   NA 139 06/08/2022 1437   NA 138 12/09/2021 0000   K 3.5 06/08/2022 1437   CL 102 06/08/2022 1437   CO2 26 06/08/2022 1437   BUN 11 06/08/2022 1437   BUN 13 12/09/2021 0000   CREATININE  0.89 06/08/2022 1437   GLU 92 12/09/2021 0000      Component Value Date/Time   CALCIUM 9.1 06/08/2022 1437   ALKPHOS 51 06/08/2022 1437   AST 16 06/08/2022 1437   ALT 28 06/08/2022 1437   BILITOT 0.5 06/08/2022 1437       RADIOGRAPHIC STUDIES: EXAM: 06/08/20 ULTRASOUND OF THE BILATERAL BREAST FINDINGS: On physical exam, palpable thickening at the 11 o'clock position of the far UPPER-OUTER RIGHT breast/axillary tail noted.   Targeted ultrasound is performed, showing the following:   RIGHT breast:   Fat necrosis changes with oil cysts at the 11 o'clock position of the far UPPER-OUTER RIGHT breast/axillary tail corresponding to the patient's palpable abnormality.   LEFT breast:   Evolving benign fat necrosis changes with benign cysts at the 1:30 to 2 o'clock position of the far UPPER OUTER LEFT breast/axillary tail   IMPRESSION: 1. Benign fat necrosis changes/oil cysts in the far UPPER-OUTER RIGHT breast/axillary tail corresponding to the patient's palpable abnormality. 2. Evolving changes of benign fat necrosis changes/oil cysts within the UPPER OUTER LEFT breast. No further imaging follow-up recommended.   RECOMMENDATION: Clinical follow-up as indicated. Any further workup should be based on clinical grounds.   I have discussed the findings and recommendations with the patient. If applicable, a reminder letter will be sent to the patient regarding the next appointment.   BI-RADS CATEGORY  2: Benign.     I,Jasmine M Lassiter,acting as a scribe for Derwood Kaplan, MD.,have documented all relevant documentation on the behalf of Derwood Kaplan, MD,as directed by  Derwood Kaplan, MD while in the presence of Derwood Kaplan, MD.

## 2022-06-08 ENCOUNTER — Other Ambulatory Visit: Payer: 59

## 2022-06-08 ENCOUNTER — Encounter: Payer: Self-pay | Admitting: Oncology

## 2022-06-08 ENCOUNTER — Other Ambulatory Visit: Payer: Self-pay | Admitting: Oncology

## 2022-06-08 ENCOUNTER — Telehealth: Payer: Self-pay | Admitting: Oncology

## 2022-06-08 ENCOUNTER — Ambulatory Visit: Payer: Self-pay | Admitting: Oncology

## 2022-06-08 ENCOUNTER — Inpatient Hospital Stay: Payer: 59 | Attending: Oncology

## 2022-06-08 ENCOUNTER — Inpatient Hospital Stay: Payer: 59 | Attending: Oncology | Admitting: Oncology

## 2022-06-08 VITALS — BP 143/93 | HR 76 | Temp 98.8°F | Resp 17 | Ht 66.0 in | Wt 241.3 lb

## 2022-06-08 DIAGNOSIS — Z170 Estrogen receptor positive status [ER+]: Secondary | ICD-10-CM | POA: Diagnosis not present

## 2022-06-08 DIAGNOSIS — C50511 Malignant neoplasm of lower-outer quadrant of right female breast: Secondary | ICD-10-CM | POA: Insufficient documentation

## 2022-06-08 LAB — CBC WITH DIFFERENTIAL (CANCER CENTER ONLY)
Abs Immature Granulocytes: 0.01 10*3/uL (ref 0.00–0.07)
Basophils Absolute: 0.1 10*3/uL (ref 0.0–0.1)
Basophils Relative: 1 %
Eosinophils Absolute: 0.2 10*3/uL (ref 0.0–0.5)
Eosinophils Relative: 2 %
HCT: 41 % (ref 36.0–46.0)
Hemoglobin: 13.7 g/dL (ref 12.0–15.0)
Immature Granulocytes: 0 %
Lymphocytes Relative: 27 %
Lymphs Abs: 2 10*3/uL (ref 0.7–4.0)
MCH: 31.2 pg (ref 26.0–34.0)
MCHC: 33.4 g/dL (ref 30.0–36.0)
MCV: 93.4 fL (ref 80.0–100.0)
Monocytes Absolute: 0.5 10*3/uL (ref 0.1–1.0)
Monocytes Relative: 6 %
Neutro Abs: 4.5 10*3/uL (ref 1.7–7.7)
Neutrophils Relative %: 64 %
Platelet Count: 264 10*3/uL (ref 150–400)
RBC: 4.39 MIL/uL (ref 3.87–5.11)
RDW: 12.5 % (ref 11.5–15.5)
WBC Count: 7.1 10*3/uL (ref 4.0–10.5)
nRBC: 0 % (ref 0.0–0.2)

## 2022-06-08 LAB — CMP (CANCER CENTER ONLY)
ALT: 28 U/L (ref 0–44)
AST: 16 U/L (ref 15–41)
Albumin: 3.8 g/dL (ref 3.5–5.0)
Alkaline Phosphatase: 51 U/L (ref 38–126)
Anion gap: 11 (ref 5–15)
BUN: 11 mg/dL (ref 6–20)
CO2: 26 mmol/L (ref 22–32)
Calcium: 9.1 mg/dL (ref 8.9–10.3)
Chloride: 102 mmol/L (ref 98–111)
Creatinine: 0.89 mg/dL (ref 0.44–1.00)
GFR, Estimated: >60 mL/min (ref >60)
Glucose, Bld: 104 mg/dL — ABNORMAL HIGH (ref 70–99)
Potassium: 3.5 mmol/L (ref 3.5–5.1)
Sodium: 139 mmol/L (ref 135–145)
Total Bilirubin: 0.5 mg/dL (ref 0.3–1.2)
Total Protein: 7 g/dL (ref 6.5–8.1)

## 2022-06-08 NOTE — Telephone Encounter (Signed)
2/7/24Next appt scheduled and confirmed with patient

## 2022-06-20 ENCOUNTER — Telehealth: Payer: Self-pay

## 2022-06-20 NOTE — Telephone Encounter (Signed)
-----   Message from Derwood Kaplan, MD sent at 06/16/2022  1:53 PM EST ----- Regarding: call Tell her labs are all great

## 2022-06-20 NOTE — Telephone Encounter (Signed)
Attempted to contact patient. No answer. 

## 2022-06-22 ENCOUNTER — Other Ambulatory Visit: Payer: Self-pay

## 2022-06-22 ENCOUNTER — Other Ambulatory Visit (HOSPITAL_COMMUNITY): Payer: Self-pay

## 2022-06-24 ENCOUNTER — Other Ambulatory Visit (HOSPITAL_COMMUNITY): Payer: Self-pay

## 2022-06-30 ENCOUNTER — Other Ambulatory Visit: Payer: Self-pay | Admitting: Hematology and Oncology

## 2022-06-30 ENCOUNTER — Other Ambulatory Visit (HOSPITAL_COMMUNITY): Payer: Self-pay

## 2022-06-30 DIAGNOSIS — C50511 Malignant neoplasm of lower-outer quadrant of right female breast: Secondary | ICD-10-CM

## 2022-07-01 ENCOUNTER — Other Ambulatory Visit (HOSPITAL_COMMUNITY): Payer: Self-pay

## 2022-07-01 ENCOUNTER — Other Ambulatory Visit: Payer: Self-pay

## 2022-07-01 MED ORDER — TAMOXIFEN CITRATE 20 MG PO TABS
20.0000 mg | ORAL_TABLET | Freq: Every day | ORAL | 3 refills | Status: DC
  Filled 2022-07-01: qty 90, 90d supply, fill #0
  Filled 2022-09-27: qty 90, 90d supply, fill #1
  Filled 2022-12-19: qty 90, 90d supply, fill #2
  Filled 2023-04-14: qty 90, 90d supply, fill #3

## 2022-07-04 ENCOUNTER — Other Ambulatory Visit: Payer: Self-pay

## 2022-07-06 ENCOUNTER — Other Ambulatory Visit (HOSPITAL_COMMUNITY): Payer: Self-pay

## 2022-07-19 ENCOUNTER — Other Ambulatory Visit: Payer: Self-pay

## 2022-07-19 ENCOUNTER — Other Ambulatory Visit (HOSPITAL_COMMUNITY): Payer: Self-pay

## 2022-07-21 ENCOUNTER — Other Ambulatory Visit (HOSPITAL_COMMUNITY): Payer: Self-pay

## 2022-07-21 MED ORDER — RIZATRIPTAN BENZOATE 5 MG PO TABS
ORAL_TABLET | ORAL | 0 refills | Status: DC
  Filled 2022-07-21: qty 10, 30d supply, fill #0

## 2022-08-01 ENCOUNTER — Other Ambulatory Visit (HOSPITAL_COMMUNITY): Payer: Self-pay

## 2022-08-06 ENCOUNTER — Other Ambulatory Visit (HOSPITAL_COMMUNITY): Payer: Self-pay

## 2022-08-08 ENCOUNTER — Other Ambulatory Visit (HOSPITAL_COMMUNITY): Payer: Self-pay

## 2022-08-11 ENCOUNTER — Other Ambulatory Visit (HOSPITAL_COMMUNITY): Payer: Self-pay

## 2022-08-15 ENCOUNTER — Other Ambulatory Visit (HOSPITAL_COMMUNITY): Payer: Self-pay

## 2022-08-16 ENCOUNTER — Other Ambulatory Visit: Payer: Self-pay

## 2022-08-16 ENCOUNTER — Other Ambulatory Visit (HOSPITAL_COMMUNITY): Payer: Self-pay

## 2022-08-16 DIAGNOSIS — K581 Irritable bowel syndrome with constipation: Secondary | ICD-10-CM | POA: Diagnosis not present

## 2022-08-16 DIAGNOSIS — M159 Polyosteoarthritis, unspecified: Secondary | ICD-10-CM | POA: Diagnosis not present

## 2022-08-16 DIAGNOSIS — E538 Deficiency of other specified B group vitamins: Secondary | ICD-10-CM | POA: Diagnosis not present

## 2022-08-16 DIAGNOSIS — Z9189 Other specified personal risk factors, not elsewhere classified: Secondary | ICD-10-CM | POA: Diagnosis not present

## 2022-08-16 DIAGNOSIS — E6609 Other obesity due to excess calories: Secondary | ICD-10-CM | POA: Insufficient documentation

## 2022-08-16 DIAGNOSIS — F330 Major depressive disorder, recurrent, mild: Secondary | ICD-10-CM | POA: Diagnosis not present

## 2022-08-16 DIAGNOSIS — G43009 Migraine without aura, not intractable, without status migrainosus: Secondary | ICD-10-CM | POA: Insufficient documentation

## 2022-08-16 DIAGNOSIS — Z23 Encounter for immunization: Secondary | ICD-10-CM | POA: Diagnosis not present

## 2022-08-16 DIAGNOSIS — Z0000 Encounter for general adult medical examination without abnormal findings: Secondary | ICD-10-CM | POA: Diagnosis not present

## 2022-08-16 DIAGNOSIS — K219 Gastro-esophageal reflux disease without esophagitis: Secondary | ICD-10-CM | POA: Diagnosis not present

## 2022-08-16 DIAGNOSIS — C50511 Malignant neoplasm of lower-outer quadrant of right female breast: Secondary | ICD-10-CM | POA: Diagnosis not present

## 2022-08-16 DIAGNOSIS — E782 Mixed hyperlipidemia: Secondary | ICD-10-CM | POA: Diagnosis not present

## 2022-08-16 DIAGNOSIS — E039 Hypothyroidism, unspecified: Secondary | ICD-10-CM | POA: Diagnosis not present

## 2022-08-16 MED ORDER — ESCITALOPRAM OXALATE 10 MG PO TABS
10.0000 mg | ORAL_TABLET | Freq: Every day | ORAL | 3 refills | Status: DC
  Filled 2023-01-25: qty 90, 90d supply, fill #0
  Filled 2023-04-17 – 2023-05-03 (×2): qty 90, 90d supply, fill #1
  Filled 2023-08-10: qty 90, 90d supply, fill #2

## 2022-08-16 MED ORDER — SYNTHROID 175 MCG PO TABS: 175.0000 ug | ORAL_TABLET | Freq: Every day | ORAL | 3 refills | Status: DC

## 2022-08-16 MED ORDER — CELECOXIB 200 MG PO CAPS
200.0000 mg | ORAL_CAPSULE | Freq: Every day | ORAL | 3 refills | Status: DC
  Filled 2022-08-16: qty 90, 90d supply, fill #0
  Filled 2022-12-19: qty 90, 90d supply, fill #1
  Filled 2023-03-15: qty 90, 90d supply, fill #2
  Filled 2023-06-13: qty 90, 90d supply, fill #3

## 2022-08-16 MED ORDER — CYCLOBENZAPRINE HCL 10 MG PO TABS
10.0000 mg | ORAL_TABLET | Freq: Three times a day (TID) | ORAL | 3 refills | Status: AC | PRN
  Filled 2022-08-16: qty 30, 10d supply, fill #0

## 2022-08-16 MED ORDER — WEGOVY 2.4 MG/0.75ML ~~LOC~~ SOAJ
2.4000 mg | SUBCUTANEOUS | 5 refills | Status: DC
  Filled 2022-08-16: qty 3, 28d supply, fill #0

## 2022-08-16 MED ORDER — ESOMEPRAZOLE MAGNESIUM 40 MG PO CPDR
40.0000 mg | DELAYED_RELEASE_CAPSULE | Freq: Two times a day (BID) | ORAL | 3 refills | Status: AC
  Filled 2022-08-16 – 2023-01-25 (×2): qty 180, 90d supply, fill #0
  Filled 2023-07-16: qty 180, 90d supply, fill #1

## 2022-08-17 ENCOUNTER — Other Ambulatory Visit (HOSPITAL_COMMUNITY): Payer: Self-pay

## 2022-08-17 ENCOUNTER — Other Ambulatory Visit: Payer: Self-pay

## 2022-08-17 DIAGNOSIS — Z9013 Acquired absence of bilateral breasts and nipples: Secondary | ICD-10-CM | POA: Diagnosis not present

## 2022-08-17 DIAGNOSIS — Z853 Personal history of malignant neoplasm of breast: Secondary | ICD-10-CM | POA: Diagnosis not present

## 2022-08-18 ENCOUNTER — Other Ambulatory Visit (HOSPITAL_COMMUNITY): Payer: Self-pay

## 2022-08-19 ENCOUNTER — Other Ambulatory Visit (HOSPITAL_COMMUNITY): Payer: Self-pay

## 2022-08-19 ENCOUNTER — Other Ambulatory Visit: Payer: Self-pay

## 2022-08-19 MED ORDER — SYNTHROID 150 MCG PO TABS
150.0000 ug | ORAL_TABLET | Freq: Every day | ORAL | 1 refills | Status: DC
  Filled 2022-08-19 – 2022-10-25 (×3): qty 90, 90d supply, fill #0
  Filled 2023-01-25: qty 90, 90d supply, fill #1

## 2022-09-15 ENCOUNTER — Other Ambulatory Visit: Payer: Self-pay

## 2022-09-22 ENCOUNTER — Other Ambulatory Visit (HOSPITAL_COMMUNITY): Payer: Self-pay

## 2022-09-27 ENCOUNTER — Other Ambulatory Visit (HOSPITAL_COMMUNITY): Payer: Self-pay

## 2022-10-25 ENCOUNTER — Other Ambulatory Visit (HOSPITAL_COMMUNITY): Payer: Self-pay

## 2022-10-25 ENCOUNTER — Other Ambulatory Visit: Payer: Self-pay

## 2022-11-23 ENCOUNTER — Ambulatory Visit (INDEPENDENT_AMBULATORY_CARE_PROVIDER_SITE_OTHER): Payer: 59 | Admitting: Gastroenterology

## 2022-11-23 ENCOUNTER — Other Ambulatory Visit (HOSPITAL_COMMUNITY): Payer: Self-pay

## 2022-11-23 ENCOUNTER — Encounter: Payer: Self-pay | Admitting: Gastroenterology

## 2022-11-23 VITALS — BP 118/78 | HR 68 | Ht 71.0 in | Wt 241.0 lb

## 2022-11-23 DIAGNOSIS — Z800 Family history of malignant neoplasm of digestive organs: Secondary | ICD-10-CM | POA: Diagnosis not present

## 2022-11-23 DIAGNOSIS — Z01818 Encounter for other preprocedural examination: Secondary | ICD-10-CM | POA: Diagnosis not present

## 2022-11-23 MED ORDER — SUTAB 1479-225-188 MG PO TABS
24.0000 | ORAL_TABLET | ORAL | 0 refills | Status: DC
  Filled 2022-11-23: qty 24, 2d supply, fill #0

## 2022-11-23 NOTE — Progress Notes (Signed)
Chief Complaint: For colonoscopy  Referring Provider:  No ref. provider found      ASSESSMENT AND PLAN;   #1. FH CRC (mom at age 57 with stage IV)   Plan: -Colon with sutabs.   Discussed risks & benefits of colonoscopy. Risks including rare perforation req laparotomy, bleeding after bx/polypectomy req blood transfusion, rarely missing neoplasms, risks of anesthesia/sedation, rare risk of damage to internal organs. Benefits outweigh the risks. Patient agrees to proceed. All the questions were answered. Pt consents to proceed.   HPI:    Natalie Burton is a 57 y.o. female  With FH CRC (mom with stage IV colon CA)  No nausea, vomiting, heartburn, regurgitation, odynophagia or dysphagia.  No significant diarrhea or constipation (better with linzess 145 mcg prn -using it once per month).  No melena or hematochezia. No unintentional weight loss. No abdominal pain.  Negative genetic test  Here for screening colonoscopy  Wants to get colonoscopy every 3 years since her mom was getting colonoscopies every 5-unfortunately, she had metastatic: CA.   Previous GI workup: Colonoscopy 09/25/2019 ([facility]) -Colonic polyp s/p polypectomy -Mild sigmoid diverticulosis -Otherwise normal to TI -Bx: Hyperplastic  Colonoscopy 07/2016 ([facility]) -Colon polyp s/p polypectomy -Small internal hemorrhoids  S/p laparoscopic cholecystectomy 1991  EGD 08/2008: Mild gastroduodenitis.  Otherwise normal EGD.  CLO negative.   SH-Works with Dr Lucie Leather.  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    Cancer (HCC)    breast   Family history of colon cancer    Headache    migraines   Hypothyroid    IBS (irritable bowel syndrome)    both IBS-C and IBS-D   Optic neuritis    age 21   Personal history of  chemotherapy    Finished 2/29/21   Sleep apnea    does not use a cpap    Past Surgical History:  Procedure Laterality Date   ABDOMINAL HYSTERECTOMY  2008   vag   AUGMENTATION MAMMAPLASTY Bilateral 08/02/2019   with fat grafting   BREAST EXCISIONAL BIOPSY Right 2014   BREAST LUMPECTOMY WITH NEEDLE LOCALIZATION Right 03/18/2013   Procedure: BREAST LUMPECTOMY WITH NEEDLE LOCALIZATION;  Surgeon: Currie Paris, MD;  Location: [facility];  Service: General;  Laterality: Right;   BREAST RECONSTRUCTION WITH PLACEMENT OF TISSUE EXPANDER AND ALLODERM Bilateral 01/22/2019   Procedure: BILATERAL BREAST RECONSTRUCTION WITH PLACEMENT OF TISSUE EXPANDER AND ALLODERM;  Surgeon: Glenna Fellows, MD;  Location: [facility];  Service: Plastics;  Laterality: Bilateral;   CHOLECYSTECTOMY  1991   lap choli   COLONOSCOPY  08/24/2016   Colonic polyp status post polypectomy. Small internal hemorrhoids.   LIPOSUCTION WITH LIPOFILLING Bilateral 08/02/2019   Procedure: LIPOFILLING bilateral chest;  Surgeon: Glenna Fellows, MD;  Location: [facility];  Service: Plastics;  Laterality: Bilateral;   MASTECTOMY Bilateral 01/2019   MASTECTOMY W/ SENTINEL NODE BIOPSY Bilateral 01/22/2019   Procedure: BILATERAL SIMPLE MASTECTOMY WITH RIGHT SENTINEL LYMPH NODE MAPPING;  Surgeon: Harriette Bouillon, MD;  Location: [facility];  Service: General;  Laterality: Bilateral;  RNFA REQUESTED   PORT-A-CATH REMOVAL Right 08/02/2019   Procedure: REMOVAL PORT-A-CATH;  Surgeon: Glenna Fellows, MD;  Location: [facility];  Service: Plastics;  Laterality: Right;   PORTACATH PLACEMENT N/A 03/12/2019  Procedure: INSERTION PORT-A-CATH WITH ULTRASOUND;  Surgeon: Harriette Bouillon, MD;  Location: [facility];  Service: General;  Laterality: N/A;   REMOVAL OF BILATERAL TISSUE EXPANDERS WITH PLACEMENT OF BILATERAL BREAST IMPLANTS Bilateral 08/02/2019   Procedure: REMOVAL OF BILATERAL TISSUE EXPANDERS WITH PLACEMENT OF BILATERAL BREAST IMPLANTS;  Surgeon:  Glenna Fellows, MD;  Location: [facility];  Service: Plastics;  Laterality: Bilateral;   SKIN BIOPSY Left 08/02/2019   Procedure: Biopsy Skin;  Surgeon: Glenna Fellows, MD;  Location: [facility];  Service: Plastics;  Laterality: Left;   TUBAL LIGATION  1998    Family History  Problem Relation Age of Onset   Cancer Mother        colon, breast   Breast cancer Mother    Colon cancer Mother 43   Colon polyps Mother 27   Cancer Father        non hodgkins lymphoma   Esophageal cancer Neg Hx    Rectal cancer Neg Hx    Stomach cancer Neg Hx     Social History   Tobacco Use   Smoking status: Former    Current packs/day: 0.00    Types: Cigarettes    Quit date: 05/31/2016    Years since quitting: 6.4   Smokeless tobacco: Never  Vaping Use   Vaping status: Never Used  Substance Use Topics   Alcohol use: Yes    Comment: socially   Drug use: No    Current Outpatient Medications  Medication Sig Dispense Refill   celecoxib (CELEBREX) 200 MG capsule Take 1 capsule (200 mg total) by mouth daily. 90 capsule 3   celecoxib (CELEBREX) 200 MG capsule Take 1 capsule (200 mg total) by mouth daily.  Indications: joint damage causing pain and loss of function. 90 capsule 3   cyclobenzaprine (FLEXERIL) 10 MG tablet Take 1 tablet (10 mg total) by mouth 3 (three) times daily as needed for muscle spasms (/migraines.). 30 tablet 3   escitalopram (LEXAPRO) 10 MG tablet Take 2 tablets (20 mg total) by mouth daily. 180 tablet 1   escitalopram (LEXAPRO) 10 MG tablet Take 1 tablet (10 mg total) by mouth daily. 90 tablet 3   esomeprazole (NEXIUM) 40 MG capsule Take 1 capsule (40 mg total) by mouth 2 (two) times daily. 180 capsule 3   esomeprazole (NEXIUM) 40 MG capsule Take 1 capsule (40 mg total) by mouth 2 (two) times daily. 180 capsule 3   LINZESS 145 MCG CAPS capsule Take 145 mcg by mouth daily as needed for constipation.     rizatriptan (MAXALT) 5 MG tablet Take 1 tablet (5 mg total) by mouth as needed for  migraine. May repeat in 2 hours if needed. 10 tablet 0   SYNTHROID 150 MCG tablet Take 1 tablet (150 mcg total) by mouth daily. 90 tablet 1   tamoxifen (NOLVADEX) 20 MG tablet Take 1 tablet (20 mg total) by mouth daily. 90 tablet 3   Semaglutide-Weight Management (WEGOVY) 2.4 MG/0.75ML SOAJ Inject 2.4 mg into the skin once a week. 3 mL 5   No current facility-administered medications for this visit.    Allergies  Allergen Reactions   Venlafaxine Shortness Of Breath   Wellbutrin [Bupropion Hcl] Shortness Of Breath   Codeine Nausea And Vomiting   Morphine And Codeine Itching   Zanaflex [Tizanidine Hydrochloride]     Slurred speech    Review of Systems:  Constitutional: Denies fever, chills, diaphoresis, appetite change and fatigue.  HEENT: Denies photophobia, eye pain, redness, hearing loss, ear pain,  congestion, sore throat, rhinorrhea, sneezing, mouth sores, neck pain, neck stiffness and tinnitus.   Respiratory: Denies SOB, DOE, cough, chest tightness,  and wheezing.   Cardiovascular: Denies chest pain, palpitations and leg swelling.  Genitourinary: Denies dysuria, urgency, frequency, hematuria, flank pain and difficulty urinating.  Musculoskeletal: Denies myalgias, back pain, joint swelling, arthralgias and gait problem.  Skin: No rash.  Neurological: Denies dizziness, seizures, syncope, weakness, light-headedness, numbness and headaches.  Hematological: Denies adenopathy. Easy bruising, personal or family bleeding history  Psychiatric/Behavioral: No anxiety or depression     Physical Exam:    BP 118/78   Pulse 68   Ht 5\' 11"  (1.803 m)   Wt 241 lb (109.3 kg)   BMI 33.61 kg/m  Wt Readings from Last 3 Encounters:  11/23/22 241 lb (109.3 kg)  06/08/22 241 lb 4.8 oz (109.5 kg)  12/09/21 265 lb 4.8 oz (120.3 kg)   Constitutional:  Well-developed, in no acute distress. Psychiatric: Normal mood and affect. Behavior is normal. HEENT: Pupils normal.  Conjunctivae are normal. No  scleral icterus. Neck supple.  Cardiovascular: Normal rate, regular rhythm. No edema Pulmonary/chest: Effort normal and breath sounds normal. No wheezing, rales or rhonchi. Abdominal: Soft, nondistended. Nontender. Bowel sounds active throughout. There are no masses palpable. No hepatomegaly. Rectal: Deferred Neurological: Alert and oriented to person place and time. Skin: Skin is warm and dry. No rashes noted.  Data Reviewed: I have personally reviewed following labs and imaging studies  CBC:    Latest Ref Rng & Units 06/08/2022    2:37 PM 12/09/2021   12:00 AM 08/05/2021   12:00 AM  CBC  WBC 4.0 - 10.5 K/uL 7.1  6.6     7.0      Hemoglobin 12.0 - 15.0 g/dL 86.5  78.4     69.6      Hematocrit 36.0 - 46.0 % 41.0  41     42      Platelets 150 - 400 K/uL 264  219     224         This result is from an external source.    CMP:    Latest Ref Rng & Units 06/08/2022    2:37 PM 12/09/2021   12:00 AM 08/05/2021   12:00 AM  CMP  Glucose 70 - 99 mg/dL 295     BUN 6 - 20 mg/dL 11  13     14       Creatinine 0.44 - 1.00 mg/dL 2.84  0.8     0.8      Sodium 135 - 145 mmol/L 139  138     141      Potassium 3.5 - 5.1 mmol/L 3.5  4.3     3.8      Chloride 98 - 111 mmol/L 102  104     106      CO2 22 - 32 mmol/L 26  26     27       Calcium 8.9 - 10.3 mg/dL 9.1  9.5     9.1      Total Protein 6.5 - 8.1 g/dL 7.0     Total Bilirubin 0.3 - 1.2 mg/dL 0.5     Alkaline Phos 38 - 126 U/L 51  70     64      AST 15 - 41 U/L 16  24     27       ALT 0 - 44 U/L 28  32  41         This result is from an external source.        Edman Circle, MD 11/23/2022, 8:46 AM  Cc: No ref. provider found

## 2022-11-23 NOTE — Patient Instructions (Addendum)
_______________________________________________________  If your blood pressure at your visit was 140/90 or greater, please contact your primary care physician to follow up on this.  _______________________________________________________  If you are age 57 or older, your body mass index should be between 23-30. Your Body mass index is 33.61 kg/m. If this is out of the aforementioned range listed, please consider follow up with your Primary Care Provider.  If you are age 34 or younger, your body mass index should be between 19-25. Your Body mass index is 33.61 kg/m. If this is out of the aformentioned range listed, please consider follow up with your Primary Care Provider.   ________________________________________________________  The [facility] providers would like to encourage you to use [facility] to communicate with providers for non-urgent requests or questions.  Due to long hold times on the telephone, sending your provider a message by [facility] may be a faster and more efficient way to get a response.  Please allow 48 business hours for a response.  Please remember that this is for non-urgent requests.  _______________________________________________________  We have sent the following medications to your pharmacy for you to pick up at your convenience: Sutab  You have been scheduled for a colonoscopy. Please follow written instructions given to you at your visit today.   Please pick up your prep supplies at the pharmacy within the next 1-3 days.  If you use inhalers (even only as needed), please bring them with you on the day of your procedure.  DO NOT TAKE 7 DAYS PRIOR TO TEST- Trulicity (dulaglutide) Ozempic, Wegovy (semaglutide) Mounjaro (tirzepatide) Bydureon Bcise (exanatide extended release)  DO NOT TAKE 1 DAY PRIOR TO YOUR TEST Rybelsus (semaglutide) Adlyxin (lixisenatide) Victoza (liraglutide) Byetta  (exanatide) ___________________________________________________________________________\  Thank you,  Dr. Lynann Bologna

## 2022-12-05 ENCOUNTER — Ambulatory Visit: Payer: 59 | Admitting: Gastroenterology

## 2022-12-05 ENCOUNTER — Encounter: Payer: Self-pay | Admitting: Gastroenterology

## 2022-12-05 VITALS — BP 114/69 | HR 60 | Temp 98.7°F | Resp 10 | Ht 71.0 in | Wt 241.0 lb

## 2022-12-05 DIAGNOSIS — D123 Benign neoplasm of transverse colon: Secondary | ICD-10-CM | POA: Diagnosis not present

## 2022-12-05 DIAGNOSIS — Z800 Family history of malignant neoplasm of digestive organs: Secondary | ICD-10-CM | POA: Diagnosis not present

## 2022-12-05 DIAGNOSIS — Z1211 Encounter for screening for malignant neoplasm of colon: Secondary | ICD-10-CM | POA: Diagnosis not present

## 2022-12-05 MED ORDER — SODIUM CHLORIDE 0.9 % IV SOLN: 500.0000 mL | Freq: Once | INTRAVENOUS | Status: DC

## 2022-12-05 NOTE — Op Note (Signed)
[facility] Patient Name: Natalie Burton Procedure Date: 12/05/2022 7:57 AM MRN: [id number] Endoscopist: Lynann Bologna , MD, [id number] Age: 57 Referring MD:  Date of Birth: 08-03-1965 Gender: Female Account #: [id number] Procedure:                Colonoscopy Indications:              Screening in patient at increased risk: FH CRC -                            mom at age 14. Medicines:                Monitored Anesthesia Care Procedure:                Pre-Anesthesia Assessment:                           - Prior to the procedure, a History and Physical                            was performed, and patient medications and                            allergies were reviewed. The patient's tolerance of                            previous anesthesia was also reviewed. The risks                            and benefits of the procedure and the sedation                            options and risks were discussed with the patient.                            All questions were answered, and informed consent                            was obtained. Prior Anticoagulants: The patient has                            taken no anticoagulant or antiplatelet agents. ASA                            Grade Assessment: II - A patient with mild systemic                            disease. After reviewing the risks and benefits,                            the patient was deemed in satisfactory condition to                            undergo the procedure.  After obtaining informed consent, the colonoscope                            was passed under direct vision. Throughout the                            procedure, the patient's blood pressure, pulse, and                            oxygen saturations were monitored continuously. The                            Olympus PCF-H190DL (#[id number]) Colonoscope was                            introduced through the anus and advanced to the  the                            cecum, identified by appendiceal orifice and                            ileocecal valve. The colonoscopy was performed                            without difficulty. The patient tolerated the                            procedure well. The quality of the bowel                            preparation was good. The ileocecal valve,                            appendiceal orifice, and rectum were photographed. Scope In: 8:09:06 AM Scope Out: 8:24:10 AM Scope Withdrawal Time: 0 hours 11 minutes 25 seconds  Total Procedure Duration: 0 hours 15 minutes 4 seconds  Findings:                 Two sessile polyps were found in the hepatic                            flexure. The polyps were 2 to 3 mm in size. These                            polyps were removed with a cold snare. Resection                            and retrieval were complete.                           Rare few small-mouthed diverticula were found in                            the sigmoid colon and ascending colon.  There was a small lipoma, 10 mm in diameter, in the                            mid ascending colon.                           Non-bleeding internal hemorrhoids were found during                            retroflexion. The hemorrhoids were small and Grade                            I (internal hemorrhoids that do not prolapse).                           The exam was otherwise without abnormality on                            direct and retroflexion views. Complications:            No immediate complications. Estimated Blood Loss:     Estimated blood loss: none. Impression:               - Two 2 to 3 mm polyps at the hepatic flexure,                            removed with a cold snare. Resected and retrieved.                           - Mild colonic diverticulosis.                           - Small incidental lipoma in the mid ascending                             colon.                           - Non-bleeding internal hemorrhoids.                           - The examination was otherwise normal on direct                            and retroflexion views. Recommendation:           - Patient has a contact number available for                            emergencies. The signs and symptoms of potential                            delayed complications were discussed with the                            patient. Return to normal activities tomorrow.  Written discharge instructions were provided to the                            patient.                           - Resume previous diet.                           - Continue present medications.                           - Await pathology results.                           - Repeat colonoscopy for surveillance based on                            pathology results.                           - The findings and recommendations were discussed                            with the patient's family. Lynann Bologna, MD 12/05/2022 8:30:18 AM This report has been signed electronically.

## 2022-12-05 NOTE — Patient Instructions (Signed)
Please read handouts provided. Continue present medications. Await pathology results.   YOU HAD AN ENDOSCOPIC PROCEDURE TODAY AT THE [facility]:   Refer to the procedure report that was given to you for any specific questions about what was found during the examination.  If the procedure report does not answer your questions, please call your gastroenterologist to clarify.  If you requested that your care partner not be given the details of your procedure findings, then the procedure report has been included in a sealed envelope for you to review at your convenience later.  YOU SHOULD EXPECT: Some feelings of bloating in the abdomen. Passage of more gas than usual.  Walking can help get rid of the air that was put into your GI tract during the procedure and reduce the bloating. If you had a lower endoscopy (such as a colonoscopy or flexible sigmoidoscopy) you may notice spotting of blood in your stool or on the toilet paper. If you underwent a bowel prep for your procedure, you may not have a normal bowel movement for a few days.  Please Note:  You might notice some irritation and congestion in your nose or some drainage.  This is from the oxygen used during your procedure.  There is no need for concern and it should clear up in a day or so.  SYMPTOMS TO REPORT IMMEDIATELY:  Following lower endoscopy (colonoscopy or flexible sigmoidoscopy):  Excessive amounts of blood in the stool  Significant tenderness or worsening of abdominal pains  Swelling of the abdomen that is new, acute  Fever of 100F or higher  For urgent or emergent issues, a gastroenterologist can be reached at any hour by calling [phone]. Do not use MyChart messaging for urgent concerns.    DIET:  We do recommend a small meal at first, but then you may proceed to your regular diet.  Drink plenty of fluids but you should avoid alcoholic beverages for 24 hours.  ACTIVITY:  You should plan to take it easy for  the rest of today and you should NOT DRIVE or use heavy machinery until tomorrow (because of the sedation medicines used during the test).    FOLLOW UP: Our staff will call the number listed on your records the next business day following your procedure.  We will call around 7:15- 8:00 am to check on you and address any questions or concerns that you may have regarding the information given to you following your procedure. If we do not reach you, we will leave a message.     If any biopsies were taken you will be contacted by phone or by letter within the next 1-3 weeks.  Please call us at [phone] if you have not heard about the biopsies in 3 weeks.    SIGNATURES/CONFIDENTIALITY: You and/or your care partner have signed paperwork which will be entered into your electronic medical record.  These signatures attest to the fact that that the information above on your After Visit Summary has been reviewed and is understood.  Full responsibility of the confidentiality of this discharge information lies with you and/or your care-partner. 

## 2022-12-05 NOTE — Progress Notes (Signed)
Uneventful anesthetic. Report to pacu rn. Vss. Care resumed by rn. 

## 2022-12-05 NOTE — Progress Notes (Signed)
Chief Complaint: For colonoscopy  Referring Provider:  Lynann Bologna, MD      ASSESSMENT AND PLAN;   #1. FH CRC (mom at age 56 with stage IV)   Plan: -Colon with sutabs.   Discussed risks & benefits of colonoscopy. Risks including rare perforation req laparotomy, bleeding after bx/polypectomy req blood transfusion, rarely missing neoplasms, risks of anesthesia/sedation, rare risk of damage to internal organs. Benefits outweigh the risks. Patient agrees to proceed. All the questions were answered. Pt consents to proceed.   HPI:    Natalie Burton is a 57 y.o. female  With FH CRC (mom with stage IV colon CA)  No nausea, vomiting, heartburn, regurgitation, odynophagia or dysphagia.  No significant diarrhea or constipation (better with linzess 145 mcg prn -using it once per month).  No melena or hematochezia. No unintentional weight loss. No abdominal pain.  Negative genetic test  Here for screening colonoscopy  Wants to get colonoscopy every 3 years since her mom was getting colonoscopies every 5-unfortunately, she had metastatic: CA.   Previous GI workup: Colonoscopy 09/25/2019 ([facility]) -Colonic polyp s/p polypectomy -Mild sigmoid diverticulosis -Otherwise normal to TI -Bx: Hyperplastic  Colonoscopy 07/2016 ([facility]) -Colon polyp s/p polypectomy -Small internal hemorrhoids  S/p laparoscopic cholecystectomy 1991  EGD 08/2008: Mild gastroduodenitis.  Otherwise normal EGD.  CLO negative.   SH-Works with Dr Lucie Leather.  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    Cancer (HCC)    breast   Family history of colon cancer    Headache    migraines   Hypothyroid    IBS (irritable bowel syndrome)    both IBS-C and IBS-D   Optic neuritis    age 89   Personal history of  chemotherapy    Finished 2/29/21   Sleep apnea    does not use a cpap    Past Surgical History:  Procedure Laterality Date   ABDOMINAL HYSTERECTOMY  2008   vag   AUGMENTATION MAMMAPLASTY Bilateral 08/02/2019   with fat grafting   BREAST EXCISIONAL BIOPSY Right 2014   BREAST LUMPECTOMY WITH NEEDLE LOCALIZATION Right 03/18/2013   Procedure: BREAST LUMPECTOMY WITH NEEDLE LOCALIZATION;  Surgeon: Currie Paris, MD;  Location: [facility];  Service: General;  Laterality: Right;   BREAST RECONSTRUCTION WITH PLACEMENT OF TISSUE EXPANDER AND ALLODERM Bilateral 01/22/2019   Procedure: BILATERAL BREAST RECONSTRUCTION WITH PLACEMENT OF TISSUE EXPANDER AND ALLODERM;  Surgeon: Glenna Fellows, MD;  Location: [facility];  Service: Plastics;  Laterality: Bilateral;   CHOLECYSTECTOMY  1991   lap choli   COLONOSCOPY  08/24/2016   Colonic polyp status post polypectomy. Small internal hemorrhoids.   LIPOSUCTION WITH LIPOFILLING Bilateral 08/02/2019   Procedure: LIPOFILLING bilateral chest;  Surgeon: Glenna Fellows, MD;  Location: [facility];  Service: Plastics;  Laterality: Bilateral;   MASTECTOMY Bilateral 01/2019   MASTECTOMY W/ SENTINEL NODE BIOPSY Bilateral 01/22/2019   Procedure: BILATERAL SIMPLE MASTECTOMY WITH RIGHT SENTINEL LYMPH NODE MAPPING;  Surgeon: Harriette Bouillon, MD;  Location: [facility];  Service: General;  Laterality: Bilateral;  RNFA REQUESTED   PORT-A-CATH REMOVAL Right 08/02/2019   Procedure: REMOVAL PORT-A-CATH;  Surgeon: Glenna Fellows, MD;  Location: [facility];  Service: Plastics;  Laterality: Right;   PORTACATH PLACEMENT N/A 03/12/2019  Procedure: INSERTION PORT-A-CATH WITH ULTRASOUND;  Surgeon: Harriette Bouillon, MD;  Location: [facility];  Service: General;  Laterality: N/A;   REMOVAL OF BILATERAL TISSUE EXPANDERS WITH PLACEMENT OF BILATERAL BREAST IMPLANTS Bilateral 08/02/2019   Procedure: REMOVAL OF BILATERAL TISSUE EXPANDERS WITH PLACEMENT OF BILATERAL BREAST IMPLANTS;  Surgeon:  Glenna Fellows, MD;  Location: [facility];  Service: Plastics;  Laterality: Bilateral;   SKIN BIOPSY Left 08/02/2019   Procedure: Biopsy Skin;  Surgeon: Glenna Fellows, MD;  Location: [facility];  Service: Plastics;  Laterality: Left;   TUBAL LIGATION  1998    Family History  Problem Relation Age of Onset   Cancer Mother        colon, breast   Breast cancer Mother    Colon cancer Mother 9   Colon polyps Mother 3   Cancer Father        non hodgkins lymphoma   Esophageal cancer Neg Hx    Rectal cancer Neg Hx    Stomach cancer Neg Hx     Social History   Tobacco Use   Smoking status: Former    Current packs/day: 0.00    Types: Cigarettes    Quit date: 05/31/2016    Years since quitting: 6.5   Smokeless tobacco: Never  Vaping Use   Vaping status: Never Used  Substance Use Topics   Alcohol use: Yes    Comment: socially   Drug use: No    Current Outpatient Medications  Medication Sig Dispense Refill   celecoxib (CELEBREX) 200 MG capsule Take 1 capsule (200 mg total) by mouth daily. 90 capsule 3   escitalopram (LEXAPRO) 10 MG tablet Take 1 tablet (10 mg total) by mouth daily. 90 tablet 3   esomeprazole (NEXIUM) 40 MG capsule Take 1 capsule (40 mg total) by mouth 2 (two) times daily. 180 capsule 3   SYNTHROID 150 MCG tablet Take 1 tablet (150 mcg total) by mouth daily. 90 tablet 1   tamoxifen (NOLVADEX) 20 MG tablet Take 1 tablet (20 mg total) by mouth daily. 90 tablet 3   celecoxib (CELEBREX) 200 MG capsule Take 1 capsule (200 mg total) by mouth daily.  Indications: joint damage causing pain and loss of function. 90 capsule 3   cyclobenzaprine (FLEXERIL) 10 MG tablet Take 1 tablet (10 mg total) by mouth 3 (three) times daily as needed for muscle spasms (/migraines.). 30 tablet 3   escitalopram (LEXAPRO) 10 MG tablet Take 2 tablets (20 mg total) by mouth daily. 180 tablet 1   esomeprazole (NEXIUM) 40 MG capsule Take 1 capsule (40 mg total) by mouth 2 (two) times daily. 180 capsule 3    LINZESS 145 MCG CAPS capsule Take 145 mcg by mouth daily as needed for constipation.     rizatriptan (MAXALT) 5 MG tablet Take 1 tablet (5 mg total) by mouth as needed for migraine. May repeat in 2 hours if needed. 10 tablet 0   Semaglutide-Weight Management (WEGOVY) 2.4 MG/0.75ML SOAJ Inject 2.4 mg into the skin once a week. 3 mL 5   Current Facility-Administered Medications  Medication Dose Route Frequency Provider Last Rate Last Admin   0.9 %  sodium chloride infusion  500 mL Intravenous Once Lynann Bologna, MD        Allergies  Allergen Reactions   Venlafaxine Shortness Of Breath   Wellbutrin [Bupropion Hcl] Shortness Of Breath   Codeine Nausea And Vomiting   Morphine And Codeine Itching   Zanaflex [Tizanidine Hydrochloride]     Slurred speech  Review of Systems:  Constitutional: Denies fever, chills, diaphoresis, appetite change and fatigue.  HEENT: Denies photophobia, eye pain, redness, hearing loss, ear pain, congestion, sore throat, rhinorrhea, sneezing, mouth sores, neck pain, neck stiffness and tinnitus.   Respiratory: Denies SOB, DOE, cough, chest tightness,  and wheezing.   Cardiovascular: Denies chest pain, palpitations and leg swelling.  Genitourinary: Denies dysuria, urgency, frequency, hematuria, flank pain and difficulty urinating.  Musculoskeletal: Denies myalgias, back pain, joint swelling, arthralgias and gait problem.  Skin: No rash.  Neurological: Denies dizziness, seizures, syncope, weakness, light-headedness, numbness and headaches.  Hematological: Denies adenopathy. Easy bruising, personal or family bleeding history  Psychiatric/Behavioral: No anxiety or depression     Physical Exam:    BP (!) 151/85   Pulse 66   Temp 98.7 F (37.1 C) (Temporal)   Resp 14   Ht 5\' 11"  (1.803 m)   Wt 241 lb (109.3 kg)   SpO2 98%   BMI 33.61 kg/m  Wt Readings from Last 3 Encounters:  12/05/22 241 lb (109.3 kg)  11/23/22 241 lb (109.3 kg)  06/08/22 241 lb 4.8  oz (109.5 kg)   Constitutional:  Well-developed, in no acute distress. Psychiatric: Normal mood and affect. Behavior is normal. HEENT: Pupils normal.  Conjunctivae are normal. No scleral icterus. Neck supple.  Cardiovascular: Normal rate, regular rhythm. No edema Pulmonary/chest: Effort normal and breath sounds normal. No wheezing, rales or rhonchi. Abdominal: Soft, nondistended. Nontender. Bowel sounds active throughout. There are no masses palpable. No hepatomegaly. Rectal: Deferred Neurological: Alert and oriented to person place and time. Skin: Skin is warm and dry. No rashes noted.  Data Reviewed: I have personally reviewed following labs and imaging studies  CBC:    Latest Ref Rng & Units 06/08/2022    2:37 PM 12/09/2021   12:00 AM 08/05/2021   12:00 AM  CBC  WBC 4.0 - 10.5 K/uL 7.1  6.6     7.0      Hemoglobin 12.0 - 15.0 g/dL 16.1  09.6     04.5      Hematocrit 36.0 - 46.0 % 41.0  41     42      Platelets 150 - 400 K/uL 264  219     224         This result is from an external source.    CMP:    Latest Ref Rng & Units 06/08/2022    2:37 PM 12/09/2021   12:00 AM 08/05/2021   12:00 AM  CMP  Glucose 70 - 99 mg/dL 409     BUN 6 - 20 mg/dL 11  13     14       Creatinine 0.44 - 1.00 mg/dL 8.11  0.8     0.8      Sodium 135 - 145 mmol/L 139  138     141      Potassium 3.5 - 5.1 mmol/L 3.5  4.3     3.8      Chloride 98 - 111 mmol/L 102  104     106      CO2 22 - 32 mmol/L 26  26     27       Calcium 8.9 - 10.3 mg/dL 9.1  9.5     9.1      Total Protein 6.5 - 8.1 g/dL 7.0     Total Bilirubin 0.3 - 1.2 mg/dL 0.5     Alkaline Phos 38 - 126 U/L 51  70  64      AST 15 - 41 U/L 16  24     27       ALT 0 - 44 U/L 28  32     41         This result is from an external source.        Edman Circle, MD 12/05/2022, 8:05 AM  Cc: Lynann Bologna, MD

## 2022-12-05 NOTE — Progress Notes (Signed)
Called to room to assist during endoscopic procedure.  Patient ID and intended procedure confirmed with present staff. Received instructions for my participation in the procedure from the performing physician.  

## 2022-12-05 NOTE — Progress Notes (Signed)
VS completed by DT.  Pt's states no medical or surgical changes since previsit or office visit.  

## 2022-12-06 ENCOUNTER — Telehealth: Payer: Self-pay

## 2022-12-06 NOTE — Progress Notes (Addendum)
ADDENDUM: Her colon polyps reveal fragments of tubular adenoma but no high grade dysplasia. Dr. Chales Abrahams will follow up.   Patient Care Team: Krystal Clark (Inactive) as PCP - General (Internal Medicine) Dellia Beckwith, MD as Consulting Physician (Oncology) Lance Bosch, MD as Consulting Physician (Radiation Oncology) Harriette Bouillon, MD as Consulting Physician (General Surgery)  Clinic Day:  12/07/22   Referring physician: No ref. 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  Due to a high EP Clin score, she received adjuvant TAC chemotherapy, and completed 6 cycles in February 2021.  FSH and estradiol were both well within the postmenopausal phase, so she was recommended for adjuvant hormonal therapy with an aromatase inhibitor.  She did not tolerate any of the three aromatase inhibitors. Her only other option for endocrine therapy was tamoxifen and she started this in December 2021. She continues to tolerate tamoxifen without significant difficulty.   Nodules of the bilateral reconstructions  These are felt to be necrotic tissue from her reconstruction.  Most recent ultrasound revealed benign changes, and I feel 3 stable nodules today. She continues to follow with Dr. Leta Baptist, and will see her tomorrow. We will follow up with those notes.      Plan  She has a colonoscopy done on 12/05/2022 by Dr Chales Abrahams and 2 polyps were removed. The pathology report isn't available yet. She is on Tamoxifen and is tolerating it well.  I will schedule her next bone density scan now as her last one was done in September of 2020. I will see her  back in 6 months.The patient understands the plans discussed today and is in agreement with them.  She knows to contact our office if she develops concerns prior to her next appointment.  I  provided 15 minutes of face-to-face time during this this encounter and > 50% was spent counseling as documented under my assessment and plan.   Dellia Beckwith, MD  [facility] AT [facility] [address] [place] [address] Dept: [phone] Dept Fax: [phone]   No orders of the defined types were placed in this encounter.     CHIEF COMPLAINT:  CC: A 57 year old female with history of breast cancer   Current Treatment:  Tamoxifen  INTERVAL HISTORY:  Ahlanna is here today for repeat clinical assessment for her history of breast cancer. She was diagnosed in July, 2020. Patient states that she feels well and has no complaint of pain. She has a colonoscopy done on 12/05/2022 by Dr Chales Abrahams and 2 polyps were removed. The pathology report isn't available yet. She is on Tamoxifen and is tolerating it well.  I will schedule her next bone density scan now since her last one was in September of 2020. I will see her  back in 6 months.  She  denies signs of infections such as sore throat, sinus drainage, cough or urinary symptoms. She  denies fever or recurrent chills. She  also deny nausea, vomiting, chest pain, or dyspnea. Her  appetite is good and Her  weight has decreased 1 pounds over last 2 weeks    I have reviewed the past medical history, past surgical history, social history and family history with the patient and they are unchanged from previous note.   ALLERGIES:  is allergic to venlafaxine, wellbutrin [bupropion hcl], codeine,  morphine and codeine, and zanaflex [tizanidine hydrochloride].  MEDICATIONS:  Current Outpatient Medications  Medication Sig Dispense Refill   celecoxib (CELEBREX) 200 MG capsule Take 1 capsule (200 mg total) by mouth daily. 90 capsule 3   celecoxib (CELEBREX) 200 MG capsule Take 1 capsule (200 mg total) by mouth daily.  Indications: joint damage causing pain and loss of function. 90 capsule 3    cyclobenzaprine (FLEXERIL) 10 MG tablet Take 1 tablet (10 mg total) by mouth 3 (three) times daily as needed for muscle spasms (/migraines.). 30 tablet 3   escitalopram (LEXAPRO) 10 MG tablet Take 2 tablets (20 mg total) by mouth daily. 180 tablet 1   escitalopram (LEXAPRO) 10 MG tablet Take 1 tablet (10 mg total) by mouth daily. 90 tablet 3   esomeprazole (NEXIUM) 40 MG capsule Take 1 capsule (40 mg total) by mouth 2 (two) times daily. 180 capsule 3   esomeprazole (NEXIUM) 40 MG capsule Take 1 capsule (40 mg total) by mouth 2 (two) times daily. 180 capsule 3   LINZESS 145 MCG CAPS capsule Take 145 mcg by mouth daily as needed for constipation.     rizatriptan (MAXALT) 5 MG tablet Take 1 tablet (5 mg total) by mouth as needed for migraine. May repeat in 2 hours if needed. 10 tablet 0   Semaglutide-Weight Management (WEGOVY) 2.4 MG/0.75ML SOAJ Inject 2.4 mg into the skin once a week. 3 mL 5   SYNTHROID 150 MCG tablet Take 1 tablet (150 mcg total) by mouth daily. 90 tablet 1   tamoxifen (NOLVADEX) 20 MG tablet Take 1 tablet (20 mg total) by mouth daily. 90 tablet 3   No current facility-administered medications for this visit.    HISTORY OF PRESENT ILLNESS:   Oncology History  Breast cancer of lower-outer quadrant of right female breast (HCC)  11/27/2018 Initial Diagnosis   Breast cancer of lower-outer quadrant of right female breast (HCC)   11/27/2018 Cancer Staging   Staging form: Breast, AJCC 8th Edition - Clinical stage from 11/27/2018: Stage IIA (cT2, cN0(sn), cM0, G3, ER+, PR+, HER2-) - Signed by Dellia Beckwith, MD on 07/27/2020 Histopathologic type: Infiltrating duct carcinoma, NOS Stage prefix: Initial diagnosis Method of lymph node assessment: Sentinel lymph node biopsy Nuclear grade: G3 Multigene prognostic tests performed: EndoPredict Histologic grading system: 3 grade system Laterality: Right Tumor size (mm): 32 Lymph-vascular invasion (LVI): LVI not present (absent)/not  identified Diagnostic confirmation: Positive histology Specimen type: Excision Staged by: Managing physician Menopausal status: Postmenopausal EndoPredict EPclin risk score: 4.7 EndoPredict EPclin risk level: High risk EndoPredict 10-year percentage risk of distant recurrence (%): 33 EndoPredict 10-year risk of distant recurrence: High risk Stage used in treatment planning: Yes National guidelines used in treatment planning: Yes Type of national guideline used in treatment planning: NCCN Staging comments: Bilateral mast & reconstruction, TAC x 6, hormonal therapy       REVIEW OF SYSTEMS:  Review of Systems  Constitutional: Negative.  Negative for appetite change, chills, diaphoresis, fatigue, fever and unexpected weight change.  HENT:  Negative.  Negative for hearing loss, lump/mass, mouth sores, nosebleeds, sore throat, tinnitus, trouble swallowing and voice change.   Eyes: Negative.  Negative for eye problems and icterus.  Respiratory: Negative.  Negative for chest tightness, cough, hemoptysis, shortness of breath and wheezing.   Cardiovascular: Negative.  Negative for chest pain, leg swelling and palpitations.  Gastrointestinal: Negative.  Negative for abdominal distention, abdominal pain, blood in stool, constipation, diarrhea, nausea, rectal pain and vomiting.  Endocrine: Negative.  Genitourinary: Negative.  Negative for bladder incontinence, difficulty urinating, dyspareunia, dysuria, frequency, hematuria, menstrual problem, nocturia, pelvic pain, vaginal bleeding and vaginal discharge.   Musculoskeletal:  Negative for arthralgias, back pain, flank pain, gait problem, myalgias, neck pain and neck stiffness.  Skin: Negative.  Negative for itching, rash and wound.  Neurological: Negative.  Negative for dizziness, extremity weakness, gait problem, headaches, light-headedness, numbness, seizures and speech difficulty.  Hematological: Negative.  Negative for adenopathy. Does not  bruise/bleed easily.  Psychiatric/Behavioral: Negative.  Negative for confusion, decreased concentration, depression, sleep disturbance and suicidal ideas. The patient is not nervous/anxious.   All other systems reviewed and are negative.   VITALS:  Blood pressure (!) 141/94, pulse 85, temperature 98.5 F (36.9 C), temperature source Oral, resp. rate 16, height 5\' 11"  (1.803 m), weight 240 lb (108.9 kg), SpO2 100%.  Wt Readings from Last 3 Encounters:  12/07/22 240 lb (108.9 kg)  12/05/22 241 lb (109.3 kg)  11/23/22 241 lb (109.3 kg)    Body mass index is 33.47 kg/m.  Performance status (ECOG): 1 - Symptomatic but completely ambulatory  PHYSICAL EXAM:  Physical Exam Vitals and nursing note reviewed.  Constitutional:      General: She is not in acute distress.    Appearance: Normal appearance. She is normal weight. She is not ill-appearing, toxic-appearing or diaphoretic.  HENT:     Head: Normocephalic and atraumatic.     Right Ear: Tympanic membrane, ear canal and external ear normal. There is no impacted cerumen.     Left Ear: Tympanic membrane, ear canal and external ear normal. There is no impacted cerumen.     Nose: Nose normal. No congestion or rhinorrhea.     Mouth/Throat:     Mouth: Mucous membranes are moist.     Pharynx: Oropharynx is clear. No oropharyngeal exudate or posterior oropharyngeal erythema.  Eyes:     General: No scleral icterus.       Right eye: No discharge.        Left eye: No discharge.     Extraocular Movements: Extraocular movements intact.     Conjunctiva/sclera: Conjunctivae normal.     Pupils: Pupils are equal, round, and reactive to light.  Neck:     Vascular: No carotid bruit.  Cardiovascular:     Rate and Rhythm: Normal rate and regular rhythm.     Pulses: Normal pulses.     Heart sounds: Normal heart sounds. No murmur heard.    No friction rub. No gallop.  Pulmonary:     Effort: Pulmonary effort is normal. No respiratory distress.      Breath sounds: Normal breath sounds. No stridor. No wheezing, rhonchi or rales.  Chest:     Chest wall: No tenderness.     Comments: She has a tiny 1 cm nodule in the upper outer quadrant of the left reconstructions which is stable, a well healed scar in the right axilla. Bilateral reconstructions are negative Abdominal:     General: Bowel sounds are normal. There is no distension.     Palpations: Abdomen is soft. There is no hepatomegaly, splenomegaly or mass.     Tenderness: There is no abdominal tenderness. There is no right CVA tenderness, left CVA tenderness, guarding or rebound.     Hernia: No hernia is present.  Musculoskeletal:        General: No swelling, tenderness, deformity or signs of injury. Normal range of motion.     Cervical back: Normal range of motion and neck  supple. No rigidity or tenderness.     Right lower leg: No edema.     Left lower leg: No edema.  Lymphadenopathy:     Cervical: No cervical adenopathy.     Right cervical: No superficial, deep or posterior cervical adenopathy.    Left cervical: No superficial, deep or posterior cervical adenopathy.     Upper Body:     Right upper body: No supraclavicular, axillary or pectoral adenopathy.     Left upper body: No supraclavicular, axillary or pectoral adenopathy.  Skin:    General: Skin is warm and dry.     Coloration: Skin is not jaundiced or pale.     Findings: No bruising, erythema, lesion or rash.  Neurological:     General: No focal deficit present.     Mental Status: She is alert and oriented to person, place, and time. Mental status is at baseline.     Cranial Nerves: No cranial nerve deficit.     Sensory: No sensory deficit.     Motor: No weakness.     Coordination: Coordination normal.     Gait: Gait normal.     Deep Tendon Reflexes: Reflexes normal.  Psychiatric:        Mood and Affect: Mood normal.        Behavior: Behavior normal.        Thought Content: Thought content normal.        Judgment:  Judgment normal.      LABORATORY DATA:  I have reviewed the data as listed Component Ref Range & Units 08/16/2022  WBC 4.40 - 11.00 10*3/uL 6.60  RBC 4.10 - 5.10 10*6/uL 4.49  Hemoglobin 12.3 - 15.3 g/dL 04.5  Hematocrit 40.9 - 44.6 % 41.6  Mean Corpuscular Volume (MCV) 80.0 - 96.0 fL 92.6  Mean Corpuscular Hemoglobin (MCH) 27.5 - 33.2 pg 31.7  Mean Corpuscular Hemoglobin Conc (MCHC) 33.0 - 37.0 g/dL 81.1  Red Cell Distribution Width (RDW) 12.3 - 17.0 % 13.5  Platelet Count (PLT) 150 - 450 10*3/uL 215  Mean Platelet Volume (MPV) 6.8 - 10.2 fL 9.4   Component Ref Range & Units 08/16/2022  Sodium 136 - 145 mmol/L 141  Potassium 3.5 - 5.1 mmol/L 4.4  Chloride 98 - 107 mmol/L 103  CO2 21 - 31 mmol/L 29  Anion Gap 6 - 14 mmol/L 9  Glucose, Random 70 - 99 mg/dL 90  Blood Urea Nitrogen (BUN) 7 - 25 mg/dL 15  Creatinine 9.14 - 7.82 mg/dL 9.56  eGFR >21 HY/QMV/7.84O9 >90  Albumin 3.5 - 5.7 g/dL 3.9  Total Protein 6.4 - 8.9 g/dL 6.5  Bilirubin, Total 0.3 - 1.0 mg/dL 0.5  Alkaline Phosphatase (ALP) 34 - 104 U/L 60  Aspartate Aminotransferase (AST) 13 - 39 U/L 28  Alanine Aminotransferase (ALT) 7 - 52 U/L 39  Calcium 8.6 - 10.3 mg/dL 8.8  BUN/Creatinine Ratio   Comment: Creatinine is normal, ratio is not clinically indicated   Component Ref Range & Units 08/16/2022  Cholesterol, Total, Lipid Panel <200 mg/dL 629 High   Triglycerides, Lipid Panel <150 mg/dL 528 High   HDL Cholesterol - Lipid Panel >=60 mg/dL 56 Low   LDL Cholesterol, Calculated <100 mg/dL 413 High   Non-HDL Cholesterol mg/dL 244   Component Ref Range & Units 08/16/2022  TSH 0.450 - 5.330 uIU/mL 0.094 Low    Component Ref Range & Units 3 mo ago  T4, Free 0.6 - 1.3 ng/dL 0.9   Component Ref Range & Units 3  mo ago  Vitamin B-12 180 - 914 pg/mL 193    Ref Range & Units 3 mo ago 02/11/2022    HEMOGLOBIN A1C <5.8 % 5.3  eAG MG/DL 540      Component Value Date/Time    NA 139 06/08/2022 1437   NA 138 12/09/2021 0000   K 3.5 06/08/2022 1437   CL 102 06/08/2022 1437   CO2 26 06/08/2022 1437   GLUCOSE 104 (H) 06/08/2022 1437   BUN 11 06/08/2022 1437   BUN 13 12/09/2021 0000   CREATININE 0.89 06/08/2022 1437   CALCIUM 9.1 06/08/2022 1437   PROT 7.0 06/08/2022 1437   ALBUMIN 3.8 06/08/2022 1437   AST 16 06/08/2022 1437   ALT 28 06/08/2022 1437   ALKPHOS 51 06/08/2022 1437   BILITOT 0.5 06/08/2022 1437   GFRNONAA >60 06/08/2022 1437   GFRAA >60 07/30/2019 0901    No results found for: "SPEP", "UPEP"  Lab Results  Component Value Date   WBC 7.1 06/08/2022   NEUTROABS 4.5 06/08/2022   HGB 13.7 06/08/2022   HCT 41.0 06/08/2022   MCV 93.4 06/08/2022   PLT 264 06/08/2022      Chemistry      Component Value Date/Time   NA 139 06/08/2022 1437   NA 138 12/09/2021 0000   K 3.5 06/08/2022 1437   CL 102 06/08/2022 1437   CO2 26 06/08/2022 1437   BUN 11 06/08/2022 1437   BUN 13 12/09/2021 0000   CREATININE 0.89 06/08/2022 1437   GLU 92 12/09/2021 0000      Component Value Date/Time   CALCIUM 9.1 06/08/2022 1437   ALKPHOS 51 06/08/2022 1437   AST 16 06/08/2022 1437   ALT 28 06/08/2022 1437   BILITOT 0.5 06/08/2022 1437       RADIOGRAPHIC STUDIES: EXAM: 06/08/20 ULTRASOUND OF THE BILATERAL BREAST FINDINGS: On physical exam, palpable thickening at the 11 o'clock position of the far UPPER-OUTER RIGHT breast/axillary tail noted.   Targeted ultrasound is performed, showing the following:   RIGHT breast:   Fat necrosis changes with oil cysts at the 11 o'clock position of the far UPPER-OUTER RIGHT breast/axillary tail corresponding to the patient's palpable abnormality.   LEFT breast:   Evolving benign fat necrosis changes with benign cysts at the 1:30 to 2 o'clock position of the far UPPER OUTER LEFT breast/axillary tail   IMPRESSION: 1. Benign fat necrosis changes/oil cysts in the far UPPER-OUTER RIGHT breast/axillary tail  corresponding to the patient's palpable abnormality. 2. Evolving changes of benign fat necrosis changes/oil cysts within the UPPER OUTER LEFT breast. No further imaging follow-up recommended.   RECOMMENDATION: Clinical follow-up as indicated. Any further workup should be based on clinical grounds.   I have discussed the findings and recommendations with the patient. If applicable, a reminder letter will be sent to the patient regarding the next appointment.   BI-RADS CATEGORY  2: Benign.      I,Oluwatobi Asade,acting as a scribe for Dellia Beckwith, MD.,have documented all relevant documentation on the behalf of Dellia Beckwith, MD,as directed by  Dellia Beckwith, MD while in the presence of Dellia Beckwith, MD.

## 2022-12-06 NOTE — Telephone Encounter (Signed)
Left message on answering machine. 

## 2022-12-07 ENCOUNTER — Other Ambulatory Visit: Payer: Self-pay | Admitting: Oncology

## 2022-12-07 ENCOUNTER — Inpatient Hospital Stay: Payer: 59 | Attending: Oncology | Admitting: Oncology

## 2022-12-07 ENCOUNTER — Encounter: Payer: Self-pay | Admitting: Oncology

## 2022-12-07 VITALS — BP 141/94 | HR 85 | Temp 98.5°F | Resp 16 | Ht 71.0 in | Wt 240.0 lb

## 2022-12-07 DIAGNOSIS — Z170 Estrogen receptor positive status [ER+]: Secondary | ICD-10-CM

## 2022-12-07 DIAGNOSIS — C50511 Malignant neoplasm of lower-outer quadrant of right female breast: Secondary | ICD-10-CM

## 2022-12-07 DIAGNOSIS — D126 Benign neoplasm of colon, unspecified: Secondary | ICD-10-CM

## 2022-12-09 ENCOUNTER — Encounter: Payer: Self-pay | Admitting: Gastroenterology

## 2022-12-19 ENCOUNTER — Other Ambulatory Visit (HOSPITAL_COMMUNITY): Payer: Self-pay

## 2022-12-28 DIAGNOSIS — D126 Benign neoplasm of colon, unspecified: Secondary | ICD-10-CM | POA: Insufficient documentation

## 2023-01-16 DIAGNOSIS — H5213 Myopia, bilateral: Secondary | ICD-10-CM | POA: Diagnosis not present

## 2023-01-24 ENCOUNTER — Encounter: Payer: Self-pay | Admitting: Oncology

## 2023-01-25 ENCOUNTER — Other Ambulatory Visit: Payer: Self-pay

## 2023-01-25 ENCOUNTER — Other Ambulatory Visit (HOSPITAL_COMMUNITY): Payer: Self-pay

## 2023-03-03 DIAGNOSIS — K581 Irritable bowel syndrome with constipation: Secondary | ICD-10-CM | POA: Diagnosis not present

## 2023-03-03 DIAGNOSIS — C50511 Malignant neoplasm of lower-outer quadrant of right female breast: Secondary | ICD-10-CM | POA: Diagnosis not present

## 2023-03-03 DIAGNOSIS — G4733 Obstructive sleep apnea (adult) (pediatric): Secondary | ICD-10-CM | POA: Diagnosis not present

## 2023-03-03 DIAGNOSIS — F330 Major depressive disorder, recurrent, mild: Secondary | ICD-10-CM | POA: Diagnosis not present

## 2023-03-03 DIAGNOSIS — E039 Hypothyroidism, unspecified: Secondary | ICD-10-CM | POA: Diagnosis not present

## 2023-03-03 DIAGNOSIS — K219 Gastro-esophageal reflux disease without esophagitis: Secondary | ICD-10-CM | POA: Diagnosis not present

## 2023-03-03 DIAGNOSIS — R232 Flushing: Secondary | ICD-10-CM | POA: Diagnosis not present

## 2023-03-03 DIAGNOSIS — G43009 Migraine without aura, not intractable, without status migrainosus: Secondary | ICD-10-CM | POA: Diagnosis not present

## 2023-03-03 DIAGNOSIS — E559 Vitamin D deficiency, unspecified: Secondary | ICD-10-CM | POA: Diagnosis not present

## 2023-03-03 DIAGNOSIS — E782 Mixed hyperlipidemia: Secondary | ICD-10-CM | POA: Diagnosis not present

## 2023-03-03 DIAGNOSIS — E538 Deficiency of other specified B group vitamins: Secondary | ICD-10-CM | POA: Diagnosis not present

## 2023-03-06 DIAGNOSIS — M1712 Unilateral primary osteoarthritis, left knee: Secondary | ICD-10-CM | POA: Diagnosis not present

## 2023-03-07 ENCOUNTER — Other Ambulatory Visit: Payer: Self-pay

## 2023-03-07 ENCOUNTER — Other Ambulatory Visit (HOSPITAL_COMMUNITY): Payer: Self-pay

## 2023-03-07 MED ORDER — SYNTHROID 175 MCG PO TABS
175.0000 ug | ORAL_TABLET | Freq: Every morning | ORAL | 3 refills | Status: DC
  Filled 2023-03-07: qty 90, 90d supply, fill #0
  Filled 2023-05-31: qty 90, 90d supply, fill #1
  Filled 2023-09-03: qty 90, 90d supply, fill #2
  Filled 2023-12-12: qty 90, 90d supply, fill #3

## 2023-03-08 ENCOUNTER — Other Ambulatory Visit: Payer: Self-pay

## 2023-03-15 ENCOUNTER — Other Ambulatory Visit: Payer: Self-pay

## 2023-04-04 DIAGNOSIS — M25562 Pain in left knee: Secondary | ICD-10-CM | POA: Insufficient documentation

## 2023-04-04 DIAGNOSIS — M1712 Unilateral primary osteoarthritis, left knee: Secondary | ICD-10-CM | POA: Diagnosis not present

## 2023-04-14 ENCOUNTER — Other Ambulatory Visit: Payer: Self-pay

## 2023-04-14 ENCOUNTER — Other Ambulatory Visit (HOSPITAL_COMMUNITY): Payer: Self-pay

## 2023-04-17 ENCOUNTER — Other Ambulatory Visit (HOSPITAL_COMMUNITY): Payer: Self-pay

## 2023-04-17 ENCOUNTER — Other Ambulatory Visit: Payer: Self-pay

## 2023-04-17 DIAGNOSIS — M1712 Unilateral primary osteoarthritis, left knee: Secondary | ICD-10-CM | POA: Insufficient documentation

## 2023-04-17 MED ORDER — RIZATRIPTAN BENZOATE 5 MG PO TABS
5.0000 mg | ORAL_TABLET | Freq: Two times a day (BID) | ORAL | 0 refills | Status: DC
  Filled 2023-04-17: qty 10, 30d supply, fill #0

## 2023-04-18 ENCOUNTER — Other Ambulatory Visit: Payer: Self-pay

## 2023-05-03 ENCOUNTER — Other Ambulatory Visit (HOSPITAL_COMMUNITY): Payer: Self-pay

## 2023-05-31 ENCOUNTER — Other Ambulatory Visit: Payer: Self-pay

## 2023-06-09 ENCOUNTER — Telehealth: Payer: Self-pay | Admitting: Oncology

## 2023-06-09 ENCOUNTER — Encounter: Payer: Self-pay | Admitting: Oncology

## 2023-06-09 ENCOUNTER — Inpatient Hospital Stay: Payer: 59 | Attending: Oncology | Admitting: Oncology

## 2023-06-09 VITALS — BP 149/96 | HR 76 | Temp 98.1°F | Resp 18 | Ht 71.0 in | Wt 257.6 lb

## 2023-06-09 DIAGNOSIS — C50511 Malignant neoplasm of lower-outer quadrant of right female breast: Secondary | ICD-10-CM | POA: Insufficient documentation

## 2023-06-09 DIAGNOSIS — Z1732 Human epidermal growth factor receptor 2 negative status: Secondary | ICD-10-CM | POA: Diagnosis not present

## 2023-06-09 DIAGNOSIS — Z170 Estrogen receptor positive status [ER+]: Secondary | ICD-10-CM | POA: Diagnosis not present

## 2023-06-09 DIAGNOSIS — Z79899 Other long term (current) drug therapy: Secondary | ICD-10-CM | POA: Diagnosis not present

## 2023-06-09 DIAGNOSIS — Z1721 Progesterone receptor positive status: Secondary | ICD-10-CM | POA: Diagnosis not present

## 2023-06-09 NOTE — Telephone Encounter (Signed)
 Pt will call the office to schedule her follow up appt.    Follow-up disposition: Return in about 52 weeks (around 06/07/2024).  Check out comments:  RT 1 year

## 2023-06-09 NOTE — Progress Notes (Signed)
 ADDENDUM: Her colon polyps reveal fragments of tubular adenoma but no high grade dysplasia. Dr. Venice Gillis will follow up.                                                       [facility] [address] [place] [address]  Patient Care Team: [place], Annabella Keys (Inactive) as PCP - General (Internal Medicine) Nolia Baumgartner, MD as Consulting Physician (Oncology) Verlie Glisson, MD as Consulting Physician (Radiation Oncology) Sim Dryer, MD as Consulting Physician (General Surgery)  Clinic Day:  06/09/23  Referring physician: No ref. 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  Due to a high EP Clin score, she received adjuvant TAC chemotherapy, and completed 6 cycles in February 2021.  FSH and estradiol  were both well within the postmenopausal phase, so she was recommended for adjuvant hormonal therapy with an aromatase inhibitor.  She did not tolerate any of the three aromatase inhibitors. Her only other option for endocrine therapy was tamoxifen  and she started this in December 2021. She continues to tolerate tamoxifen  without significant difficulty.   Nodules of the bilateral reconstructions  These are felt to be necrotic tissue from her reconstruction.  Most recent ultrasound revealed benign changes, and I feel 3 stable nodules today. She continues to follow with Dr. Marieta Shorten and will see soon.    Plan: She continues Tamoxifen  without difficulty. She will be scheduling her bone density here soon. She had a colonoscopy done on 12/05/2022 and two 2-77mm polyps at the hepatic flexure were found and removed. A small incidental lipoma in the mid ascending colon was also found. Pathology revealed a tubular adenoma with no high-grade dysplasia. She will have repeat colonoscopy  in 3 years. Her PCP does routine blood work. I will see her back in 1 year. We can then discuss whether to stop the Tamoxifen  for up to 5 years or 10 years. The patient understands the plans discussed today and is in agreement with them.  She knows to contact our office if she develops concerns prior to her next appointment.  I provided 15 minutes of face-to-face time during this this encounter and > 50% was spent counseling as documented under my assessment and plan.   Nolia Baumgartner, MD  [facility] DE[facility] OF [facility] [address] [place] [address] Dept: [phone] Dept Fax: [phone]   No orders of the defined types were placed in this encounter.    CHIEF COMPLAINT:  CC: history of breast cancer   Current Treatment:  Tamoxifen   INTERVAL HISTORY:  Natalie Burton is here today for repeat clinical assessment for her history of breast cancer. She was diagnosed in July, 2020. Patient states that she feels well and has no complaints of pain. She continues Tamoxifen  without difficulty. She will be scheduling her bone density here soon. She had a colonoscopy done on 12/05/2022 and two 2-52mm polyps at the hepatic flexure were found and removed. A small incidental lipoma in the mid  ascending colon was also found. Pathology revealed a tubular adenoma with no high-grade dysplasia. She will have repeat colonoscopy in 3 years. Her PCP does routine blood work. I will see her back in 1 year. We can then discuss whether to stop the Tamoxifen  for up to 5 years or 10 years.   She denies signs of infection such as sore throat, sinus drainage, cough, or urinary symptoms.  She denies fevers or recurrent chills. She denies pain. She denies nausea, vomiting, chest pain, dyspnea or cough. Her appetite is good and her weight has increased 17 pounds over last 6 months.  I have reviewed the past medical history, past surgical history, social  history and family history with the patient and they are unchanged from previous note.   ALLERGIES:  is allergic to bupropion hcl, venlafaxine, sertraline, sertraline hcl, codeine, morphine and codeine, and tizanidine hydrochloride.  MEDICATIONS:  Current Outpatient Medications  Medication Sig Dispense Refill   traMADol (ULTRAM) 50 MG tablet Take 50 mg by mouth every 6 (six) hours as needed for moderate pain (pain score 4-6).     celecoxib  (CELEBREX ) 200 MG capsule Take 1 capsule (200 mg total) by mouth daily.  Indications: joint damage causing pain and loss of function. 90 capsule 1   cyclobenzaprine  (FLEXERIL ) 10 MG tablet Take 1 tablet (10 mg total) by mouth 3 (three) times daily as needed for muscle spasms Christain Courser.). (Patient taking differently: Take 10 mg by mouth 3 (three) times daily as needed for muscle spasms.) 30 tablet 3   escitalopram  (LEXAPRO ) 10 MG tablet Take 2 tablets (20 mg total) by mouth daily. 180 tablet 1   escitalopram  (LEXAPRO ) 10 MG tablet Take 1 tablet (10 mg total) by mouth daily. 90 tablet 3   esomeprazole  (NEXIUM ) 40 MG capsule Take 1 capsule (40 mg total) by mouth 2 (two) times daily. 180 capsule 3   esomeprazole  (NEXIUM ) 40 MG capsule Take 1 capsule (40 mg total) by mouth 2 (two) times daily. 180 capsule 3   LINZESS 145 MCG CAPS capsule Take 145 mcg by mouth daily as needed for constipation.     metoprolol  tartrate (LOPRESSOR ) 100 MG tablet Take one tablet 2 hours before cardiac CT for heart greater than 55 1 tablet 0   rizatriptan  (MAXALT ) 5 MG tablet Take 1 tablet (5 mg total) by mouth as needed for migraine. May repeat in 2 hours if needed. (Patient taking differently: Take 5 mg by mouth as needed for migraine.) 10 tablet 0   SYNTHROID  175 MCG tablet Take 1 tablet (175 mcg total) by mouth in the morning. 90 tablet 3   tamoxifen  (NOLVADEX ) 20 MG tablet Take 1 tablet (20 mg total) by mouth daily. 90 tablet 3   No current facility-administered medications for  this visit.    HISTORY OF PRESENT ILLNESS:   Oncology History  Breast cancer of lower-outer quadrant of right female breast (HCC)  11/27/2018 Initial Diagnosis   Breast cancer of lower-outer quadrant of right female breast (HCC)   11/27/2018 Cancer Staging   Staging form: Breast, AJCC 8th Edition - Clinical stage from 11/27/2018: Stage IIA (cT2, cN0(sn), cM0, G3, ER+, PR+, HER2-) - Signed by Nolia Baumgartner, MD on 07/27/2020 Histopathologic type: Infiltrating duct carcinoma, NOS Stage prefix: Initial diagnosis Method of lymph node assessment: Sentinel lymph node biopsy Nuclear grade: G3 Multigene prognostic tests performed: EndoPredict Histologic grading system: 3 grade system Laterality: Right Tumor size (mm): 32 Lymph-vascular invasion (LVI): LVI not present (absent)/not identified Diagnostic confirmation:  Positive histology Specimen type: Excision Staged by: Managing physician Menopausal status: Postmenopausal EndoPredict EPclin risk score: 4.7 EndoPredict EPclin risk level: High risk EndoPredict 10-year percentage risk of distant recurrence (%): 33 EndoPredict 10-year risk of distant recurrence: High risk Stage used in treatment planning: Yes National guidelines used in treatment planning: Yes Type of national guideline used in treatment planning: NCCN Staging comments: Bilateral mast & reconstruction, TAC x 6, hormonal therapy     REVIEW OF SYSTEMS:  Review of Systems  Constitutional: Negative.  Negative for appetite change, chills, diaphoresis, fatigue, fever and unexpected weight change.  HENT:  Negative.  Negative for hearing loss, lump/mass, mouth sores, nosebleeds, sore throat, tinnitus, trouble swallowing and voice change.   Eyes: Negative.  Negative for eye problems and icterus.  Respiratory: Negative.  Negative for chest tightness, cough, hemoptysis, shortness of breath and wheezing.   Cardiovascular: Negative.  Negative for chest pain, leg swelling and  palpitations.  Gastrointestinal: Negative.  Negative for abdominal distention, abdominal pain, blood in stool, constipation, diarrhea, nausea, rectal pain and vomiting.  Endocrine: Negative.   Genitourinary: Negative.  Negative for bladder incontinence, difficulty urinating, dyspareunia, dysuria, frequency, hematuria, menstrual problem, nocturia, pelvic pain, vaginal bleeding and vaginal discharge.   Musculoskeletal:  Negative for arthralgias, back pain, flank pain, gait problem, myalgias, neck pain and neck stiffness.  Skin: Negative.  Negative for itching, rash and wound.  Neurological: Negative.  Negative for dizziness, extremity weakness, gait problem, headaches, light-headedness, numbness, seizures and speech difficulty.  Hematological: Negative.  Negative for adenopathy. Does not bruise/bleed easily.  Psychiatric/Behavioral: Negative.  Negative for confusion, decreased concentration, depression, sleep disturbance and suicidal ideas. The patient is not nervous/anxious.   All other systems reviewed and are negative.   VITALS:  Blood pressure (!) 149/96, pulse 76, temperature 98.1 F (36.7 C), temperature source Oral, resp. rate 18, height 5\' 11"  (1.803 m), weight 257 lb 9.6 oz (116.8 kg), SpO2 98%.  Wt Readings from Last 3 Encounters:  09/21/23 270 lb (122.5 kg)  06/09/23 257 lb 9.6 oz (116.8 kg)  12/07/22 240 lb (108.9 kg)    Body mass index is 35.93 kg/m.  Performance status (ECOG): 1 - Symptomatic but completely ambulatory  PHYSICAL EXAM:  Physical Exam Vitals and nursing note reviewed.  Constitutional:      General: She is not in acute distress.    Appearance: Normal appearance. She is normal weight. She is not ill-appearing, toxic-appearing or diaphoretic.  HENT:     Head: Normocephalic and atraumatic.     Right Ear: Tympanic membrane, ear canal and external ear normal. There is no impacted cerumen.     Left Ear: Tympanic membrane, ear canal and external ear normal. There  is no impacted cerumen.     Nose: Nose normal. No congestion or rhinorrhea.     Mouth/Throat:     Mouth: Mucous membranes are moist.     Pharynx: Oropharynx is clear. No oropharyngeal exudate or posterior oropharyngeal erythema.  Eyes:     General: No scleral icterus.       Right eye: No discharge.        Left eye: No discharge.     Extraocular Movements: Extraocular movements intact.     Conjunctiva/sclera: Conjunctivae normal.     Pupils: Pupils are equal, round, and reactive to light.  Neck:     Vascular: No carotid bruit.  Cardiovascular:     Rate and Rhythm: Normal rate and regular rhythm.     Pulses: Normal pulses.  Heart sounds: Normal heart sounds. No murmur heard.    No friction rub. No gallop.  Pulmonary:     Effort: Pulmonary effort is normal. No respiratory distress.     Breath sounds: Normal breath sounds. No stridor. No wheezing, rhonchi or rales.  Chest:     Chest wall: No tenderness.     Comments: Stable approximately 1cm nodule in the upper outer quadrant of the right reconstruction 2 nodules in the upper outer left reconstruction that are less than 1cm.  Abdominal:     General: Bowel sounds are normal. There is no distension.     Palpations: Abdomen is soft. There is no hepatomegaly, splenomegaly or mass.     Tenderness: There is no abdominal tenderness. There is no right CVA tenderness, left CVA tenderness, guarding or rebound.     Hernia: No hernia is present.  Musculoskeletal:        General: No swelling, tenderness, deformity or signs of injury. Normal range of motion.     Cervical back: Normal range of motion and neck supple. No rigidity or tenderness.     Right lower leg: No edema.     Left lower leg: No edema.  Lymphadenopathy:     Cervical: No cervical adenopathy.     Right cervical: No superficial, deep or posterior cervical adenopathy.    Left cervical: No superficial, deep or posterior cervical adenopathy.     Upper Body:     Right upper body:  No supraclavicular, axillary or pectoral adenopathy.     Left upper body: No supraclavicular, axillary or pectoral adenopathy.  Skin:    General: Skin is warm and dry.     Coloration: Skin is not jaundiced or pale.     Findings: No bruising, erythema, lesion or rash.  Neurological:     General: No focal deficit present.     Mental Status: She is alert and oriented to person, place, and time. Mental status is at baseline.     Cranial Nerves: No cranial nerve deficit.     Sensory: No sensory deficit.     Motor: No weakness.     Coordination: Coordination normal.     Gait: Gait normal.     Deep Tendon Reflexes: Reflexes normal.  Psychiatric:        Mood and Affect: Mood normal.        Behavior: Behavior normal.        Thought Content: Thought content normal.        Judgment: Judgment normal.      LABORATORY DATA:  I have reviewed the data as listed    Component Value Date/Time   NA 139 06/08/2022 1437   NA 138 12/09/2021 0000   K 3.5 06/08/2022 1437   CL 102 06/08/2022 1437   CO2 26 06/08/2022 1437   GLUCOSE 104 (H) 06/08/2022 1437   BUN 11 06/08/2022 1437   BUN 13 12/09/2021 0000   CREATININE 0.89 06/08/2022 1437   CALCIUM  9.1 06/08/2022 1437   PROT 7.0 06/08/2022 1437   ALBUMIN 3.8 06/08/2022 1437   AST 16 06/08/2022 1437   ALT 28 06/08/2022 1437   ALKPHOS 51 06/08/2022 1437   BILITOT 0.5 06/08/2022 1437   GFRNONAA >60 06/08/2022 1437   GFRAA >60 07/30/2019 0901    No results found for: "SPEP", "UPEP"  Lab Results  Component Value Date   WBC 7.1 06/08/2022   NEUTROABS 4.5 06/08/2022   HGB 13.7 06/08/2022   HCT 41.0 06/08/2022  MCV 93.4 06/08/2022   PLT 264 06/08/2022      Chemistry      Component Value Date/Time   NA 139 06/08/2022 1437   NA 138 12/09/2021 0000   K 3.5 06/08/2022 1437   CL 102 06/08/2022 1437   CO2 26 06/08/2022 1437   BUN 11 06/08/2022 1437   BUN 13 12/09/2021 0000   CREATININE 0.89 06/08/2022 1437   GLU 92 12/09/2021 0000       Component Value Date/Time   CALCIUM  9.1 06/08/2022 1437   ALKPHOS 51 06/08/2022 1437   AST 16 06/08/2022 1437   ALT 28 06/08/2022 1437   BILITOT 0.5 06/08/2022 1437     RADIOGRAPHIC STUDIES:     I,Jasmine M Lassiter,acting as a scribe for Nolia Baumgartner, MD.,have documented all relevant documentation on the behalf of Nolia Baumgartner, MD,as directed by  Nolia Baumgartner, MD while in the presence of Nolia Baumgartner, MD.

## 2023-06-13 ENCOUNTER — Other Ambulatory Visit (HOSPITAL_COMMUNITY): Payer: Self-pay

## 2023-07-16 ENCOUNTER — Other Ambulatory Visit (HOSPITAL_COMMUNITY): Payer: Self-pay

## 2023-07-17 ENCOUNTER — Other Ambulatory Visit: Payer: Self-pay

## 2023-07-17 ENCOUNTER — Other Ambulatory Visit (HOSPITAL_COMMUNITY): Payer: Self-pay

## 2023-07-17 MED ORDER — CELECOXIB 200 MG PO CAPS
200.0000 mg | ORAL_CAPSULE | Freq: Every day | ORAL | 1 refills | Status: DC
Start: 2023-07-17 — End: 2024-03-18
  Filled 2023-09-11: qty 90, 90d supply, fill #0
  Filled 2023-11-28: qty 90, 90d supply, fill #1

## 2023-07-17 MED ORDER — RIZATRIPTAN BENZOATE 5 MG PO TABS
5.0000 mg | ORAL_TABLET | ORAL | 0 refills | Status: AC | PRN
Start: 2023-07-17 — End: ?
  Filled 2023-07-17: qty 10, 30d supply, fill #0

## 2023-07-18 ENCOUNTER — Encounter: Payer: Self-pay | Admitting: Pharmacist

## 2023-07-18 ENCOUNTER — Other Ambulatory Visit (HOSPITAL_COMMUNITY): Payer: Self-pay

## 2023-07-18 ENCOUNTER — Other Ambulatory Visit: Payer: Self-pay

## 2023-07-18 ENCOUNTER — Other Ambulatory Visit: Payer: Self-pay | Admitting: Oncology

## 2023-07-18 DIAGNOSIS — C50511 Malignant neoplasm of lower-outer quadrant of right female breast: Secondary | ICD-10-CM

## 2023-07-18 MED FILL — Tamoxifen Citrate Tab 20 MG (Base Equivalent): ORAL | 90 days supply | Qty: 90 | Fill #0 | Status: AC

## 2023-07-19 ENCOUNTER — Other Ambulatory Visit: Payer: Self-pay

## 2023-08-10 ENCOUNTER — Other Ambulatory Visit (HOSPITAL_COMMUNITY): Payer: Self-pay

## 2023-08-11 ENCOUNTER — Ambulatory Visit (INDEPENDENT_AMBULATORY_CARE_PROVIDER_SITE_OTHER): Admitting: Podiatry

## 2023-08-11 DIAGNOSIS — M79675 Pain in left toe(s): Secondary | ICD-10-CM | POA: Diagnosis not present

## 2023-08-11 DIAGNOSIS — M79674 Pain in right toe(s): Secondary | ICD-10-CM

## 2023-08-11 DIAGNOSIS — L600 Ingrowing nail: Secondary | ICD-10-CM

## 2023-08-11 DIAGNOSIS — Q846 Other congenital malformations of nails: Secondary | ICD-10-CM

## 2023-08-11 NOTE — Progress Notes (Signed)
 Subjective:  Patient ID: Natalie Burton, female    DOB: 08-22-1965,  MRN: [id number]  Lakasha Desiderio Floras Fano presents to clinic today for:  Chief Complaint  Patient presents with   Ingrown Toenail    Bilateral 1st nails. They are very ingrown, pinched. It has gotten worse over the last couple years and she really needs something done with them. Not diabetic and no anticoag.    Patient presents with the above concerns.  Admits that she has always been dealing with crawling out of their signs and toenails.  Denies trauma.  Denies any recent drainage.  States that they are very curled inward ingrown would like to have them removed today.  She is wondering if they have may grow back the same if she has a temporary avulsion to see if they will grow flatter.  Allergies  Allergen Reactions   Venlafaxine Shortness Of Breath   Bupropion Hcl Other (See Comments)   Sertraline Other (See Comments)   Codeine Nausea And Vomiting   Morphine And Codeine Itching   Zanaflex [Tizanidine Hydrochloride]     Slurred speech    Review of Systems: Negative except as noted in the HPI.  Objective:  There were no vitals filed for this visit.  Natalie Burton is a pleasant 58 y.o. female in NAD. AAO x 3.  Vascular Examination: Capillary refill time is 3-5 seconds to toes bilateral. Palpable pedal pulses b/l LE. Digital hair present b/l. No pedal edema b/l. Skin temperature gradient WNL b/l. No varicosities b/l. No cyanosis or clubbing noted b/l.   Dermatological Examination: There is incurvation of the bilateral hallux, bilateral nail border.  There is pain on palpation of the affected nail borders.  There is a pinching shape to the nail where there is not any significant flattened portion of the nail.  Neurological Examination: Epicritic sensation is intact bilateral  Assessment/Plan: 1. Pain in left toe(s)   2. Pain in right toe(s)   3. Onychomycosis of great toe    4. Abnormality of shape of nail     Discussed patient's condition today.  Discussed temporary removal of the complete toenails to allow for nail growth.  If the nail grows is the same morphology is currently she may opt to have permanent avulsion of the nails or the borders in the future   After obtaining patient consent, the bilateral hallux was anesthetized with a 50:50 mixture of 1% lidocaine plain and 0.5% bupivacaine plain for a total of 3cc's administered.  Upon confirmation of anesthesia, a freer elevator was utilized to free the bilateral hallux toenails from the nail bed.  The bilateral hallux nails were then avulsed proximal to the eponychium and removed in toto.  The area was inspected for any remaining spicules.   Antibiotic ointment and a DSD were applied, followed by a Coban dressing.  Patient tolerated the anesthetic and procedure well and will f/u in 2-3 weeks for recheck.  Patient given post-procedure instructions for daily 15-minute Epsom salt soaks, antibiotic ointment and daily use of Bandaids until toe starts to dry / form eschar.   Follow-up in 2 weeks or the patient can give us  a call with a progress report on how the toes are healing in 2 weeks.   Joe Murders, DPM, FACFAS [facility]     [address]  [place], [place] [address]                Office [phone]  Fax [phone]

## 2023-08-11 NOTE — Patient Instructions (Signed)
 Place 1/4 cup of epsom salts in a quart of warm tap water.  Submerge your foot or feet in the solution and soak for 20 minutes.  This soak should be done twice a day.  Next, remove your foot or feet from solution, blot dry the affected area. Apply ointment and cover if instructed by your doctor.   IF YOUR SKIN BECOMES IRRITATED WHILE USING THESE INSTRUCTIONS, IT IS OKAY TO SWITCH TO  WHITE VINEGAR AND WATER.  As another alternative soak, you may use antibacterial soap and water.  Dry the area well after soaking.  Apply a small amount of antibiotic ointment to the area, followed by a Bandaid or light gauze dressing.  As the area starts to dry out over the next few days, you can remove the covering at bedtime to encourage the toe to dry out more.  Once it forms a dry scab, you can discontinue these instructions.  Monitor for any signs/symptoms of infection. Call the office immediately if any occur or go directly to the emergency room. Call with any questions/concerns.

## 2023-08-17 DIAGNOSIS — Z9013 Acquired absence of bilateral breasts and nipples: Secondary | ICD-10-CM | POA: Diagnosis not present

## 2023-08-17 DIAGNOSIS — Z853 Personal history of malignant neoplasm of breast: Secondary | ICD-10-CM | POA: Diagnosis not present

## 2023-08-24 ENCOUNTER — Ambulatory Visit: Admitting: Podiatry

## 2023-08-24 ENCOUNTER — Other Ambulatory Visit (HOSPITAL_COMMUNITY): Payer: Self-pay

## 2023-08-30 ENCOUNTER — Ambulatory Visit: Admitting: Podiatry

## 2023-09-01 ENCOUNTER — Ambulatory Visit: Admitting: Podiatry

## 2023-09-01 ENCOUNTER — Other Ambulatory Visit (HOSPITAL_BASED_OUTPATIENT_CLINIC_OR_DEPARTMENT_OTHER): Payer: Self-pay

## 2023-09-01 DIAGNOSIS — R1084 Generalized abdominal pain: Secondary | ICD-10-CM | POA: Diagnosis not present

## 2023-09-01 DIAGNOSIS — M25512 Pain in left shoulder: Secondary | ICD-10-CM | POA: Diagnosis not present

## 2023-09-01 DIAGNOSIS — M79602 Pain in left arm: Secondary | ICD-10-CM | POA: Diagnosis not present

## 2023-09-01 DIAGNOSIS — B029 Zoster without complications: Secondary | ICD-10-CM | POA: Diagnosis not present

## 2023-09-01 DIAGNOSIS — M545 Low back pain, unspecified: Secondary | ICD-10-CM | POA: Diagnosis not present

## 2023-09-01 MED ORDER — ACYCLOVIR 400 MG PO TABS
800.0000 mg | ORAL_TABLET | Freq: Two times a day (BID) | ORAL | 0 refills | Status: AC
  Filled 2023-09-01: qty 40, 10d supply, fill #0

## 2023-09-02 DIAGNOSIS — R079 Chest pain, unspecified: Secondary | ICD-10-CM | POA: Diagnosis not present

## 2023-09-04 ENCOUNTER — Other Ambulatory Visit: Payer: Self-pay

## 2023-09-04 ENCOUNTER — Other Ambulatory Visit (HOSPITAL_COMMUNITY): Payer: Self-pay

## 2023-09-06 ENCOUNTER — Ambulatory Visit (INDEPENDENT_AMBULATORY_CARE_PROVIDER_SITE_OTHER): Admitting: Podiatry

## 2023-09-06 DIAGNOSIS — L600 Ingrowing nail: Secondary | ICD-10-CM

## 2023-09-06 NOTE — Progress Notes (Signed)
       Subjective:  Patient ID: Natalie Burton, female    DOB: 12/11/1965,  MRN: [id number]  Chief Complaint  Patient presents with   PNA Check    Bilateral first nails removed temporarily. Looks good with no redness and no pain. They are scabbed over well. She did ask how long before she sees new growth. Not diabetic and no anti coag.     Natalie Burton Natalie Burton presents to clinic today for f/u of temporary avulsion to the bilateral hallux complete toenails.  This was performed in an attempt to allow the nail bed to flatten out in hopes that the new nail growing in will grow flatter and not as incurvated.  She feels that they got more incurvated after cancer treatment/chemotherapy.  She is having no pain at this time.  States the drainage stopped at approximately 2 weeks postprocedure.  She does have some scab formation noted.  She had went to the beach since last seen and notes that she really tried to keep the areas clean as possible and washed out after the day on the beach.  Allergies  Allergen Reactions   Venlafaxine Shortness Of Breath   Bupropion Hcl Other (See Comments)   Sertraline Other (See Comments)   Codeine Nausea And Vomiting   Morphine And Codeine Itching   Zanaflex [Tizanidine Hydrochloride]     Slurred speech    Objective:  Vascular Examination: Capillary refill time is 3-5 seconds to toes bilateral. Palpable pedal pulses b/l LE. Digital hair present b/l. No pedal edema b/l. Skin temperature gradient WNL b/l. No varicosities b/l. No cyanosis or clubbing noted b/l.   Dermatological Examination: Upon inspection of the nail avulsion sites, there are no clinical signs of infection.  No purulence, no necrosis, no malodor present.  Minimal to no erythema present.  Eschar formed along nail bed.  No pain on palpation of area.   Assessment/Plan: 1. Ingrown toenail    Patient informed that the new nail should continue to grow in from the base.  She may start  seeing this coming beyond the cuticle within the next couple of weeks.  She will keep an eye on the area.  The eschar should fall off on its own without difficulty.  She will resist the urge to scratch the scabs off or else it might start draining again.  If there are any signs of abnormal new nail growth she can call the office and we can see if there is anything we can do preventatively as the nail continues to grow out.  Otherwise follow-up as needed   Joe Murders, DPM, FACFAS [facility]     [address] [place], [place] [address]                Office [phone]  Fax [phone]

## 2023-09-07 DIAGNOSIS — M170 Bilateral primary osteoarthritis of knee: Secondary | ICD-10-CM | POA: Diagnosis not present

## 2023-09-11 ENCOUNTER — Other Ambulatory Visit (HOSPITAL_COMMUNITY): Payer: Self-pay

## 2023-09-11 ENCOUNTER — Other Ambulatory Visit: Payer: Self-pay

## 2023-09-11 DIAGNOSIS — M79642 Pain in left hand: Secondary | ICD-10-CM | POA: Insufficient documentation

## 2023-09-11 DIAGNOSIS — M1812 Unilateral primary osteoarthritis of first carpometacarpal joint, left hand: Secondary | ICD-10-CM | POA: Insufficient documentation

## 2023-09-21 ENCOUNTER — Encounter: Payer: Self-pay | Admitting: Cardiology

## 2023-09-21 ENCOUNTER — Other Ambulatory Visit (HOSPITAL_COMMUNITY): Payer: Self-pay

## 2023-09-21 ENCOUNTER — Other Ambulatory Visit: Payer: Self-pay

## 2023-09-21 ENCOUNTER — Ambulatory Visit: Attending: Cardiology | Admitting: Cardiology

## 2023-09-21 VITALS — BP 124/70 | HR 66 | Ht 71.0 in | Wt 270.0 lb

## 2023-09-21 DIAGNOSIS — Z170 Estrogen receptor positive status [ER+]: Secondary | ICD-10-CM

## 2023-09-21 DIAGNOSIS — E039 Hypothyroidism, unspecified: Secondary | ICD-10-CM | POA: Diagnosis not present

## 2023-09-21 DIAGNOSIS — E782 Mixed hyperlipidemia: Secondary | ICD-10-CM | POA: Diagnosis not present

## 2023-09-21 DIAGNOSIS — R072 Precordial pain: Secondary | ICD-10-CM

## 2023-09-21 DIAGNOSIS — C50511 Malignant neoplasm of lower-outer quadrant of right female breast: Secondary | ICD-10-CM

## 2023-09-21 DIAGNOSIS — R0789 Other chest pain: Secondary | ICD-10-CM | POA: Diagnosis not present

## 2023-09-21 DIAGNOSIS — Z7689 Persons encountering health services in other specified circumstances: Secondary | ICD-10-CM | POA: Diagnosis not present

## 2023-09-21 MED ORDER — METOPROLOL TARTRATE 100 MG PO TABS
ORAL_TABLET | ORAL | 0 refills | Status: AC
  Filled 2023-09-21: qty 1, 1d supply, fill #0

## 2023-09-21 NOTE — Patient Instructions (Addendum)
 Medication Instructions:   TAKE: Metoprolol 100mg  1 tablet 2 hours prior to CT scan   Lab Work: None Ordered If you have labs (blood work) drawn today and your tests are completely normal, you will receive your results only by: MyChart Message (if you have MyChart) OR A paper copy in the mail If you have any lab test that is abnormal or we need to change your treatment, we will call you to review the results.   Testing/Procedures:  Your cardiac CT will be scheduled at one of the below locations:   [facility] [address] [place], [place] [address]  Please follow these instructions carefully (unless otherwise directed):    On the Night Before the Test: Be sure to Drink plenty of water . Do not consume any caffeinated/decaffeinated beverages or chocolate 12 hours prior to your test. Do not take any antihistamines 12 hours prior to your test.   On the Day of the Test: Drink plenty of water  until 1 hour prior to the test. Do not eat any food 4 hours prior to the test. You may take your regular medications prior to the test.  Take metoprolol (Lopressor) two hours prior to test. FEMALES- please wear underwire-free bra if available, avoid dresses & tight clothing       After the Test: Drink plenty of water . After receiving IV contrast, you may experience a mild flushed feeling. This is normal. On occasion, you may experience a mild rash up to 24 hours after the test. This is not dangerous. If this occurs, you can take Benadryl  25 mg and increase your fluid intake. If you experience trouble breathing, this can be serious. If it is severe call 911 IMMEDIATELY. If it is mild, please call our office. If you take any of these medications: Glipizide/Metformin, Avandament, Glucavance, please do not take 48 hours after completing test unless otherwise instructed.  We will call to schedule your test 2-4 weeks out understanding that some insurance companies will need an  authorization prior to the service being performed.   For non-scheduling related questions, please contact the cardiac imaging nurse navigator should you have any questions/concerns: Jinger Mount, Cardiac Imaging Nurse Navigator Chase Copping, Cardiac Imaging Nurse Navigator [facility] Direct Office Dial: [phone]   For scheduling needs, including cancellations and rescheduling, please call [place], [phone].    Follow-Up: At [facility], you and your health needs are our priority.  As part of our continuing mission to provide you with exceptional heart care, we have created designated Provider Care Teams.  These Care Teams include your primary Cardiologist (physician) and Advanced Practice Providers (APPs -  Physician Assistants and Nurse Practitioners) who all work together to provide you with the care you need, when you need it.  We recommend signing up for the patient portal called "MyChart".  Sign up information is provided on this After Visit Summary.  MyChart is used to connect with patients for Virtual Visits (Telemedicine).  Patients are able to view lab/test results, encounter notes, upcoming appointments, etc.  Non-urgent messages can be sent to your provider as well.   To learn more about what you can do with MyChart, go to [email or web address].    Your next appointment:   2 month(s)  The format for your next appointment:   In Person  Provider:   Ralene Burger, MD    Other Instructions NA

## 2023-09-21 NOTE — Progress Notes (Signed)
 Cardiology Consultation:    Date:  09/21/2023   ID:  Natalie Burton, DOB 09-27-1965, MRN [id number]  PCP:  Despina Floro (Inactive)  Cardiologist:  Ralene Burger, MD   Referring MD: No ref. provider found   Chief Complaint  Patient presents with   Establish Care    History of Present Illness:    Natalie Burton is a 58 y.o. female who is being seen today for the evaluation of chest pain at the request of No ref. provider found.  I have a pleasure to meet her in 2019 she came to me because of atypical chest pain.  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  At the time she had evaluation done in form of stress echocardiogram which was negative.  She comes again with chest pain somewhat atypical she complained of having continuous sensation of the left arm sometimes in the chest as well.  That sensation last for hours sometimes even days, no aggravating or relieving factors, symptoms or shortness of breath.  She described to have sweating all the time and so there is no really contributing.  She does not exercise on the regular basis.  She quit smoking couple years ago she was diagnosed with breast cancer.  She is not on any special diet.  She does have family history of premature coronary artery disease.  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    Cancer (HCC)    breast   Family history of colon cancer    Headache    migraines   Hypothyroid    IBS (irritable bowel syndrome)    both IBS-C and IBS-D   Optic neuritis    age 68   Personal history of chemotherapy    Finished 2/29/21   Sleep apnea    does not use a cpap    Past Surgical History:  Procedure Laterality Date   ABDOMINAL HYSTERECTOMY  2008   vag   AUGMENTATION MAMMAPLASTY Bilateral  08/02/2019   with fat grafting   BREAST EXCISIONAL BIOPSY Right 2014   BREAST LUMPECTOMY WITH NEEDLE LOCALIZATION Right 03/18/2013   Procedure: BREAST LUMPECTOMY WITH NEEDLE LOCALIZATION;  Surgeon: Darcella Earnest, MD;  Location: [facility];  Service: General;  Laterality: Right;   BREAST RECONSTRUCTION WITH PLACEMENT OF TISSUE EXPANDER AND ALLODERM Bilateral 01/22/2019   Procedure: BILATERAL BREAST RECONSTRUCTION WITH PLACEMENT OF TISSUE EXPANDER AND ALLODERM;  Surgeon: Alger Infield, MD;  Location: [facility];  Service: Plastics;  Laterality: Bilateral;   CHOLECYSTECTOMY  1991   lap choli   COLONOSCOPY  08/24/2016   Colonic polyp status post polypectomy. Small internal hemorrhoids.   LIPOSUCTION WITH LIPOFILLING Bilateral 08/02/2019   Procedure: LIPOFILLING bilateral chest;  Surgeon: Alger Infield, MD;  Location: [facility];  Service: Plastics;  Laterality: Bilateral;   MASTECTOMY Bilateral 01/2019   MASTECTOMY W/ SENTINEL NODE BIOPSY Bilateral 01/22/2019   Procedure: BILATERAL SIMPLE MASTECTOMY WITH RIGHT SENTINEL LYMPH NODE MAPPING;  Surgeon: Sim Dryer, MD;  Location: [facility];  Service: General;  Laterality: Bilateral;  RNFA REQUESTED   PORT-A-CATH REMOVAL Right 08/02/2019   Procedure: REMOVAL PORT-A-CATH;  Surgeon: Alger Infield, MD;  Location: [facility];  Service: Plastics;  Laterality: Right;   PORTACATH PLACEMENT N/A 03/12/2019  Procedure: INSERTION PORT-A-CATH WITH ULTRASOUND;  Surgeon: Sim Dryer, MD;  Location: [facility];  Service: General;  Laterality: N/A;   REMOVAL OF BILATERAL TISSUE EXPANDERS WITH PLACEMENT OF BILATERAL BREAST IMPLANTS Bilateral 08/02/2019   Procedure: REMOVAL OF BILATERAL TISSUE EXPANDERS WITH PLACEMENT OF BILATERAL BREAST IMPLANTS;  Surgeon: Alger Infield, MD;  Location: [facility];  Service: Plastics;  Laterality: Bilateral;   SKIN BIOPSY Left 08/02/2019   Procedure: Biopsy Skin;  Surgeon: Alger Infield, MD;  Location: [facility];  Service:  Plastics;  Laterality: Left;   TUBAL LIGATION  1998    Current Medications: Current Meds  Medication Sig   celecoxib  (CELEBREX ) 200 MG capsule Take 1 capsule (200 mg total) by mouth daily.  Indications: joint damage causing pain and loss of function.   cyclobenzaprine  (FLEXERIL ) 10 MG tablet Take 1 tablet (10 mg total) by mouth 3 (three) times daily as needed for muscle spasms Christain Courser.). (Patient taking differently: Take 10 mg by mouth 3 (three) times daily as needed for muscle spasms.)   escitalopram  (LEXAPRO ) 10 MG tablet Take 2 tablets (20 mg total) by mouth daily.   escitalopram  (LEXAPRO ) 10 MG tablet Take 1 tablet (10 mg total) by mouth daily.   esomeprazole  (NEXIUM ) 40 MG capsule Take 1 capsule (40 mg total) by mouth 2 (two) times daily.   esomeprazole  (NEXIUM ) 40 MG capsule Take 1 capsule (40 mg total) by mouth 2 (two) times daily.   LINZESS 145 MCG CAPS capsule Take 145 mcg by mouth daily as needed for constipation.   rizatriptan  (MAXALT ) 5 MG tablet Take 1 tablet (5 mg total) by mouth as needed for migraine. May repeat in 2 hours if needed. (Patient taking differently: Take 5 mg by mouth as needed for migraine.)   SYNTHROID  175 MCG tablet Take 1 tablet (175 mcg total) by mouth in the morning.   tamoxifen  (NOLVADEX ) 20 MG tablet Take 1 tablet (20 mg total) by mouth daily.   traMADol (ULTRAM) 50 MG tablet Take 50 mg by mouth every 6 (six) hours as needed for moderate pain (pain score 4-6).   [DISCONTINUED] celecoxib  (CELEBREX ) 200 MG capsule Take 1 capsule (200 mg total) by mouth daily.     Allergies:   Bupropion hcl, Venlafaxine, Sertraline, Sertraline hcl, Codeine, Morphine and codeine, and Tizanidine hydrochloride   Social History   Socioeconomic History   Marital status: Married    Spouse name: Not on file   Number of children: Not on file   Years of education: Not on file   Highest education level: Not on file  Occupational History   Not on file  Tobacco Use    Smoking status: Former    Current packs/day: 0.00    Types: Cigarettes    Quit date: 05/31/2016    Years since quitting: 7.3   Smokeless tobacco: Never  Vaping Use   Vaping status: Never Used  Substance and Sexual Activity   Alcohol use: Yes    Comment: socially   Drug use: No   Sexual activity: Not on file  Other Topics Concern   Not on file  Social History Narrative   Not on file   Social Drivers of Health   Financial Resource Strain: Not on file  Food Insecurity: Low Risk  (03/02/2023)   Received from [facility]   Hunger Vital Sign    Worried About Running Out of Food in the Last Year: Never true    Ran Out of Food in the Last Year: Never true  Transportation Needs: No Transportation Needs (03/02/2023)   Received from [facility]    In the past 12 months, has lack of reliable transportation kept you from medical appointments, meetings, work or from getting things needed for daily living? : No  Physical Activity: Not on file  Stress: Not on file  Social Connections: Not on file     Family History: The patient's family history includes Breast cancer in her mother; Cancer in her father and mother; Colon cancer (age of onset: 20) in her mother; Colon polyps (age of onset: 48) in her mother. There is no history of Esophageal cancer, Rectal cancer, or Stomach cancer. ROS:   Please see the history of present illness.    All 14 point review of systems negative except as described per history of present illness.  EKGs/Labs/Other Studies Reviewed:    The following studies were reviewed today:   EKG:  EKG Interpretation Date/Time:  Thursday Sep 21 2023 09:09:13 EDT Ventricular Rate:  66 PR Interval:  158 QRS Duration:  84 QT Interval:  410 QTC Calculation: 429 R Axis:   -22  Text Interpretation: Sinus rhythm with Premature atrial complexes Otherwise normal ECG When compared with ECG of 23-May-2017 22:00, Premature atrial complexes are now Present  Confirmed by Ralene Burger [phone]) on 09/21/2023 9:15:24 AM    Recent Labs: No results found for requested labs within last 365 days.  Recent Lipid Panel    Component Value Date/Time   CHOL 239 (H) 05/31/2017 1011   TRIG 124 05/31/2017 1011   HDL 71 05/31/2017 1011   CHOLHDL 3.4 05/31/2017 1011   LDLCALC 143 (H) 05/31/2017 1011    Physical Exam:    VS:  BP 124/70 (BP Location: Left Arm, Patient Position: Sitting)   Pulse 66   Ht 5\' 11"  (1.803 m)   Wt 270 lb (122.5 kg)   SpO2 97%   BMI 37.66 kg/m     Wt Readings from Last 3 Encounters:  09/21/23 270 lb (122.5 kg)  06/09/23 257 lb 9.6 oz (116.8 kg)  12/07/22 240 lb (108.9 kg)     GEN:  Well nourished, well developed in no acute distress HEENT: Normal NECK: No JVD; No carotid bruits LYMPHATICS: No lymphadenopathy CARDIAC: RRR, no murmurs, no rubs, no gallops RESPIRATORY:  Clear to auscultation without rales, wheezing or rhonchi  ABDOMEN: Soft, non-tender, non-distended MUSCULOSKELETAL:  No edema; No deformity  SKIN: Warm and dry NEUROLOGIC:  Alert and oriented x 3 PSYCHIATRIC:  Normal affect   ASSESSMENT:    1. Encounter to establish care   2. Atypical chest pain   3. Malignant neoplasm of lower-outer quadrant of right breast of female, estrogen receptor positive (HCC)   4. Acquired hypothyroidism   5. Mixed hyperlipidemia    PLAN:    In order of problems listed above:  Chest pain with some atypical characteristic but she is a woman with some risk factors therefore we need to evaluate her for potential coronary artery disease.  I think the best test that we can perform on her will be coronary CT angio.  I explained procedure to her we will schedule her to have it done. Dyslipidemia I do not have any recent fasting lipid profile, will call primary care physician to get a copy of her fasting lipid profile. History of smoking she has abstained from smoking congratulated her and encouraged her to not to  smoke. Hypothyroidism.  Had been taking care of by her primary care physicians.  Medication Adjustments/Labs and Tests Ordered: Current medicines are reviewed at length with the patient today.  Concerns regarding medicines are outlined above.  Orders Placed This Encounter  Procedures   EKG 12-Lead   No orders of the defined types were placed in this encounter.   Signed, Manfred Seed, MD, [facility]. 09/21/2023 9:38 AM    [facility]

## 2023-09-21 NOTE — Addendum Note (Signed)
 Addended by: Shawnee Dellen D on: 09/21/2023 09:54 AM   Modules accepted: Orders

## 2023-09-29 ENCOUNTER — Other Ambulatory Visit (HOSPITAL_BASED_OUTPATIENT_CLINIC_OR_DEPARTMENT_OTHER): Admitting: Radiology

## 2023-10-06 ENCOUNTER — Other Ambulatory Visit (HOSPITAL_BASED_OUTPATIENT_CLINIC_OR_DEPARTMENT_OTHER): Admitting: Radiology

## 2023-10-12 ENCOUNTER — Other Ambulatory Visit (HOSPITAL_COMMUNITY): Payer: Self-pay

## 2023-10-12 MED FILL — Tamoxifen Citrate Tab 20 MG (Base Equivalent): ORAL | 90 days supply | Qty: 90 | Fill #1 | Status: AC

## 2023-10-13 DIAGNOSIS — Z0000 Encounter for general adult medical examination without abnormal findings: Secondary | ICD-10-CM | POA: Diagnosis not present

## 2023-10-16 ENCOUNTER — Encounter (HOSPITAL_COMMUNITY): Payer: Self-pay

## 2023-10-18 ENCOUNTER — Telehealth (HOSPITAL_COMMUNITY): Payer: Self-pay | Admitting: Emergency Medicine

## 2023-10-18 NOTE — Telephone Encounter (Signed)
 Reaching out to patient to offer assistance regarding upcoming cardiac imaging study; pt verbalizes understanding of appt date/time, parking situation and where to check in, pre-test NPO status and medications ordered, and verified current allergies; name and call back number provided for further questions should they arise Rockwell Alexandria RN Navigator Cardiac Imaging [facility] [phone] office [phone] cell

## 2023-10-19 ENCOUNTER — Encounter (HOSPITAL_BASED_OUTPATIENT_CLINIC_OR_DEPARTMENT_OTHER): Payer: Self-pay | Admitting: Radiology

## 2023-10-19 ENCOUNTER — Ambulatory Visit (HOSPITAL_BASED_OUTPATIENT_CLINIC_OR_DEPARTMENT_OTHER)
Admission: RE | Admit: 2023-10-19 | Discharge: 2023-10-19 | Disposition: A | Discharge status: Home / Self Care | Source: Ambulatory Visit | Attending: Cardiology | Admitting: Cardiology

## 2023-10-19 DIAGNOSIS — R072 Precordial pain: Secondary | ICD-10-CM | POA: Diagnosis not present

## 2023-10-19 MED ORDER — DILTIAZEM HCL 25 MG/5ML IV SOLN
10.0000 mg | INTRAVENOUS | Status: DC | PRN
Start: 2023-10-19 — End: 2023-10-20

## 2023-10-19 MED ORDER — IOHEXOL 350 MG/ML SOLN
95.0000 mL | Freq: Once | INTRAVENOUS | Status: AC | PRN
  Administered 2023-10-19: 95 mL via INTRAVENOUS

## 2023-10-19 MED ORDER — NITROGLYCERIN 0.4 MG SL SUBL
0.8000 mg | SUBLINGUAL_TABLET | Freq: Once | SUBLINGUAL | Status: AC
  Administered 2023-10-19: 0.8 mg via SUBLINGUAL

## 2023-10-19 MED ORDER — METOPROLOL TARTRATE 5 MG/5ML IV SOLN: 10.0000 mg | Freq: Once | INTRAVENOUS | Status: DC | PRN

## 2023-10-19 MED ORDER — NITROGLYCERIN 0.4 MG SL SUBL: 0.8000 mg | SUBLINGUAL_TABLET | Freq: Once | SUBLINGUAL | Status: DC

## 2023-10-22 ENCOUNTER — Ambulatory Visit: Payer: Self-pay | Admitting: Cardiology

## 2023-10-24 ENCOUNTER — Telehealth: Payer: Self-pay

## 2023-10-24 NOTE — Telephone Encounter (Signed)
 Left message on My Chart with CT angio results per Dr. Tonja Fray note. Routed to PCP.

## 2023-10-31 ENCOUNTER — Telehealth: Payer: Self-pay

## 2023-10-31 NOTE — Telephone Encounter (Signed)
 Pt viewed CT angio results on My Chart per Dr. Tonja Fray note. Routed to PCP.

## 2023-11-09 ENCOUNTER — Telehealth: Payer: Self-pay

## 2023-11-09 NOTE — Telephone Encounter (Signed)
 Pt viewed Ct angio results on My Chart per Dr. Karry note. Routed to PCP.

## 2023-11-21 ENCOUNTER — Other Ambulatory Visit (HOSPITAL_COMMUNITY): Payer: Self-pay

## 2023-11-21 ENCOUNTER — Other Ambulatory Visit: Payer: Self-pay

## 2023-11-21 ENCOUNTER — Ambulatory Visit: Attending: Cardiology | Admitting: Cardiology

## 2023-11-21 ENCOUNTER — Encounter: Payer: Self-pay | Admitting: Cardiology

## 2023-11-21 VITALS — BP 134/90 | HR 80 | Ht 71.0 in | Wt 273.8 lb

## 2023-11-21 DIAGNOSIS — R0609 Other forms of dyspnea: Secondary | ICD-10-CM

## 2023-11-21 DIAGNOSIS — I2510 Atherosclerotic heart disease of native coronary artery without angina pectoris: Secondary | ICD-10-CM

## 2023-11-21 DIAGNOSIS — E782 Mixed hyperlipidemia: Secondary | ICD-10-CM | POA: Diagnosis not present

## 2023-11-21 MED ORDER — PRAVASTATIN SODIUM 20 MG PO TABS
20.0000 mg | ORAL_TABLET | Freq: Every evening | ORAL | 3 refills | Status: AC
  Filled 2023-11-21: qty 90, 90d supply, fill #0

## 2023-11-21 NOTE — Patient Instructions (Addendum)
 Medication Instructions:   START: Pravastatin  20mg  1 tablet daily   Lab Work: Your physician recommends that you return for lab work in: 6 weeks You need to have labs done when you are fasting.  You can come Monday through Friday 8:30 am to 12:00 pm and 1:15 to 4:30. You do not need to make an appointment as the order has already been placed. The labs you are going to have done are AST, ALT Lipids.    Testing/Procedures: None Ordered   Follow-Up: At [facility], you and your health needs are our priority.  As part of our continuing mission to provide you with exceptional heart care, we have created designated Provider Care Teams.  These Care Teams include your primary Cardiologist (physician) and Advanced Practice Providers (APPs -  Physician Assistants and Nurse Practitioners) who all work together to provide you with the care you need, when you need it.  We recommend signing up for the patient portal called MyChart.  Sign up information is provided on this After Visit Summary.  MyChart is used to connect with patients for Virtual Visits (Telemedicine).  Patients are able to view lab/test results, encounter notes, upcoming appointments, etc.  Non-urgent messages can be sent to your provider as well.   To learn more about what you can do with MyChart, go to [email or web address].    Your next appointment:   6 month(s)  The format for your next appointment:   In Person  Provider:   Lamar Fitch, MD    Other Instructions NA

## 2023-11-21 NOTE — Progress Notes (Signed)
 Cardiology Office Note:    Date:  11/21/2023   ID:  Natalie Burton, DOB Mar 18, 1966, MRN [id number]  PCP:  Benson Eleanor Rung (Inactive)  Cardiologist:  Lamar Fitch, MD    Referring MD: No ref. provider found   Chief Complaint  Patient presents with   Follow-up    History of Present Illness:    Natalie Burton is a 58 y.o. female comes today 2 months to discuss results of her coronary CT angio overall she is doing fine.  Denies having chest pain tightness squeezing pressure burning chest.  We spent a great of time talking about her coronary CT angio.  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    Cancer (HCC)    breast   Family history of colon cancer    Headache    migraines   Hypothyroid    IBS (irritable bowel syndrome)    both IBS-C and IBS-D   Optic neuritis    age 35   Personal history of chemotherapy    Finished 2/29/21   Sleep apnea    does not use a cpap    Past Surgical History:  Procedure Laterality Date   ABDOMINAL HYSTERECTOMY  2008   vag   AUGMENTATION MAMMAPLASTY Bilateral 08/02/2019   with fat grafting   BREAST EXCISIONAL BIOPSY Right 2014   BREAST LUMPECTOMY WITH NEEDLE LOCALIZATION Right 03/18/2013   Procedure: BREAST LUMPECTOMY WITH NEEDLE LOCALIZATION;  Surgeon: Sherlean JINNY Laughter, MD;  Location: [facility];  Service: General;  Laterality: Right;   BREAST RECONSTRUCTION WITH PLACEMENT OF TISSUE EXPANDER AND ALLODERM Bilateral 01/22/2019   Procedure: BILATERAL BREAST RECONSTRUCTION WITH PLACEMENT OF TISSUE EXPANDER AND ALLODERM;  Surgeon: Arelia Filippo, MD;  Location: [facility];  Service: Plastics;  Laterality: Bilateral;   CHOLECYSTECTOMY  1991   lap choli   COLONOSCOPY  08/24/2016   Colonic polyp status post  polypectomy. Small internal hemorrhoids.   LIPOSUCTION WITH LIPOFILLING Bilateral 08/02/2019   Procedure: LIPOFILLING bilateral chest;  Surgeon: Arelia Filippo, MD;  Location: [facility];  Service: Plastics;  Laterality: Bilateral;   MASTECTOMY Bilateral 01/2019   MASTECTOMY W/ SENTINEL NODE BIOPSY Bilateral 01/22/2019   Procedure: BILATERAL SIMPLE MASTECTOMY WITH RIGHT SENTINEL LYMPH NODE MAPPING;  Surgeon: Vanderbilt Ned, MD;  Location: [facility];  Service: General;  Laterality: Bilateral;  RNFA REQUESTED   PORT-A-CATH REMOVAL Right 08/02/2019   Procedure: REMOVAL PORT-A-CATH;  Surgeon: Arelia Filippo, MD;  Location: [facility];  Service: Plastics;  Laterality: Right;   PORTACATH PLACEMENT N/A 03/12/2019   Procedure: INSERTION PORT-A-CATH WITH ULTRASOUND;  Surgeon: Vanderbilt Ned, MD;  Location: [facility];  Service: General;  Laterality: N/A;   REMOVAL OF BILATERAL TISSUE EXPANDERS WITH PLACEMENT OF BILATERAL BREAST IMPLANTS Bilateral 08/02/2019   Procedure: REMOVAL OF BILATERAL TISSUE EXPANDERS WITH PLACEMENT OF BILATERAL BREAST IMPLANTS;  Surgeon: Arelia Filippo, MD;  Location: [facility];  Service: Plastics;  Laterality: Bilateral;   SKIN BIOPSY Left 08/02/2019   Procedure: Biopsy Skin;  Surgeon: Arelia Filippo, MD;  Location: [facility];  Service: Plastics;  Laterality: Left;   TUBAL LIGATION  1998    Current Medications: Current Meds  Medication Sig   celecoxib  (CELEBREX ) 200 MG capsule Take 1 capsule (200 mg total) by mouth daily.  Indications: joint damage causing pain and loss of function.   cyclobenzaprine  (FLEXERIL ) 10 MG tablet Take 1 tablet (10 mg total) by mouth 3 (three) times daily as needed for muscle spasms corbin.). (Patient taking differently: Take 10 mg by mouth 3 (three) times daily as needed for muscle spasms.)   escitalopram  (LEXAPRO ) 10 MG tablet Take 2 tablets (20 mg total) by mouth daily.   escitalopram  (LEXAPRO ) 10 MG tablet Take 1 tablet (10 mg total) by mouth daily.    esomeprazole  (NEXIUM ) 40 MG capsule Take 1 capsule (40 mg total) by mouth 2 (two) times daily.   esomeprazole  (NEXIUM ) 40 MG capsule Take 1 capsule (40 mg total) by mouth 2 (two) times daily.   LINZESS 145 MCG CAPS capsule Take 145 mcg by mouth daily as needed for constipation.   metoprolol  tartrate (LOPRESSOR ) 100 MG tablet Take one tablet 2 hours before cardiac CT for heart greater than 55   rizatriptan  (MAXALT ) 5 MG tablet Take 1 tablet (5 mg total) by mouth as needed for migraine. May repeat in 2 hours if needed. (Patient taking differently: Take 5 mg by mouth as needed for migraine.)   SYNTHROID  175 MCG tablet Take 1 tablet (175 mcg total) by mouth in the morning.   tamoxifen  (NOLVADEX ) 20 MG tablet Take 1 tablet (20 mg total) by mouth daily.   traMADol (ULTRAM) 50 MG tablet Take 50 mg by mouth every 6 (six) hours as needed for moderate pain (pain score 4-6).     Allergies:   Bupropion hcl, Venlafaxine, Sertraline, Sertraline hcl, Codeine, Morphine and codeine, and Tizanidine hydrochloride   Social History   Socioeconomic History   Marital status: Married    Spouse name: Not on file   Number of children: Not on file   Years of education: Not on file   Highest education level: Not on file  Occupational History   Not on file  Tobacco Use   Smoking status: Former    Current packs/day: 0.00    Types: Cigarettes    Quit date: 05/31/2016    Years since quitting: 7.4   Smokeless tobacco: Never  Vaping Use   Vaping status: Never Used  Substance and Sexual Activity   Alcohol use: Yes    Comment: socially   Drug use: No   Sexual activity: Not on file  Other Topics Concern   Not on file  Social History Narrative   Not on file   Social Drivers of Health   Financial Resource Strain: Not on file  Food Insecurity: Low Risk  (03/02/2023)   Received from [facility]   Hunger Vital Sign    Within the past 12 months, you worried that your food would run out before you got money to  buy more: Never true    Within the past 12 months, the food you bought just didn't last and you didn't have money to get more. : Never true  Transportation Needs: No Transportation Needs (03/02/2023)   Received from [facility]    In the past 12 months, has lack of reliable transportation kept you from medical appointments, meetings, work or from getting things needed for daily living? : No  Physical Activity: Not on file  Stress: Not on file  Social Connections: Not on file     Family History: The patient's family history includes Breast cancer in her mother; Cancer in her father and mother; Colon cancer (age of onset: 40) in her mother; Colon polyps (age  of onset: 74) in her mother. There is no history of Esophageal cancer, Rectal cancer, or Stomach cancer. ROS:   Please see the history of present illness.    All 14 point review of systems negative except as described per history of present illness  EKGs/Labs/Other Studies Reviewed:         Recent Labs: No results found for requested labs within last 365 days.  Recent Lipid Panel    Component Value Date/Time   CHOL 239 (H) 05/31/2017 1011   TRIG 124 05/31/2017 1011   HDL 71 05/31/2017 1011   CHOLHDL 3.4 05/31/2017 1011   LDLCALC 143 (H) 05/31/2017 1011    Physical Exam:    VS:  BP (!) 134/90 (BP Location: Left Arm, Patient Position: Sitting)   Pulse 80   Ht 5' 11 (1.803 m)   Wt 273 lb 12.8 oz (124.2 kg)   SpO2 96%   BMI 38.19 kg/m     Wt Readings from Last 3 Encounters:  11/21/23 273 lb 12.8 oz (124.2 kg)  09/21/23 270 lb (122.5 kg)  06/09/23 257 lb 9.6 oz (116.8 kg)     GEN:  Well nourished, well developed in no acute distress HEENT: Normal NECK: No JVD; No carotid bruits LYMPHATICS: No lymphadenopathy CARDIAC: RRR, no murmurs, no rubs, no gallops RESPIRATORY:  Clear to auscultation without rales, wheezing or rhonchi  ABDOMEN: Soft, non-tender, non-distended MUSCULOSKELETAL:  No edema;  No deformity  SKIN: Warm and dry LOWER EXTREMITIES: no swelling NEUROLOGIC:  Alert and oriented x 3 PSYCHIATRIC:  Normal affect   ASSESSMENT:    1. Coronary artery disease involving native coronary artery of native heart without angina pectoris   2. Dyspnea on exertion   3. Mixed hyperlipidemia    PLAN:    In order of problems listed above:  Coronary disease likely only mild nonobstructive disease involving proximal LAD, total plaque volume only 10 mm, calcium  score 3.05 that was 71 percentile.  We had a long discussion with today about the situation.  With her mixed dyslipidemia I think there is value of statin.  She did try some statin but did not feel well.  Will try to give her pravastatin  20 daily see if she can tolerate that.  Fasting lipid profile, AST ALT will be checked in about 6 weeks. Dyspnea exertion I encouraged her to BL be more active she does have some problems initially to find some way to exercise. Overall risk assessment I did discuss some basic of Mediterranean diet.   Medication Adjustments/Labs and Tests Ordered: Current medicines are reviewed at length with the patient today.  Concerns regarding medicines are outlined above.  No orders of the defined types were placed in this encounter.  Medication changes: No orders of the defined types were placed in this encounter.   Signed, Lamar DOROTHA Fitch, MD, [facility] 11/21/2023 2:05 PM    [facility]

## 2023-11-28 ENCOUNTER — Other Ambulatory Visit (HOSPITAL_COMMUNITY): Payer: Self-pay

## 2023-11-28 ENCOUNTER — Other Ambulatory Visit: Payer: Self-pay

## 2023-11-28 MED ORDER — ESCITALOPRAM OXALATE 10 MG PO TABS
10.0000 mg | ORAL_TABLET | Freq: Every day | ORAL | 1 refills | Status: DC
  Filled 2023-11-28: qty 90, 90d supply, fill #0
  Filled 2024-03-17: qty 90, 90d supply, fill #1

## 2023-11-28 MED ORDER — ESOMEPRAZOLE MAGNESIUM 40 MG PO CPDR
40.0000 mg | DELAYED_RELEASE_CAPSULE | Freq: Two times a day (BID) | ORAL | 3 refills | Status: AC
  Filled 2023-11-28: qty 180, 90d supply, fill #0
  Filled 2024-06-05: qty 180, 90d supply, fill #1

## 2023-12-12 ENCOUNTER — Other Ambulatory Visit (HOSPITAL_COMMUNITY): Payer: Self-pay

## 2023-12-12 ENCOUNTER — Other Ambulatory Visit: Payer: Self-pay

## 2023-12-13 ENCOUNTER — Other Ambulatory Visit: Payer: Self-pay

## 2023-12-13 ENCOUNTER — Other Ambulatory Visit (HOSPITAL_COMMUNITY): Payer: Self-pay

## 2023-12-13 DIAGNOSIS — Z9189 Other specified personal risk factors, not elsewhere classified: Secondary | ICD-10-CM | POA: Diagnosis not present

## 2023-12-13 DIAGNOSIS — R1032 Left lower quadrant pain: Secondary | ICD-10-CM | POA: Diagnosis not present

## 2023-12-13 DIAGNOSIS — K5730 Diverticulosis of large intestine without perforation or abscess without bleeding: Secondary | ICD-10-CM | POA: Diagnosis not present

## 2023-12-14 ENCOUNTER — Other Ambulatory Visit (HOSPITAL_BASED_OUTPATIENT_CLINIC_OR_DEPARTMENT_OTHER): Payer: Self-pay | Admitting: Specialist

## 2023-12-14 DIAGNOSIS — R1032 Left lower quadrant pain: Secondary | ICD-10-CM

## 2023-12-15 ENCOUNTER — Encounter (HOSPITAL_BASED_OUTPATIENT_CLINIC_OR_DEPARTMENT_OTHER): Payer: Self-pay | Admitting: *Deleted

## 2023-12-15 ENCOUNTER — Ambulatory Visit (HOSPITAL_BASED_OUTPATIENT_CLINIC_OR_DEPARTMENT_OTHER)
Admission: RE | Admit: 2023-12-15 | Discharge: 2023-12-15 | Disposition: A | Discharge status: Home / Self Care | Source: Ambulatory Visit | Attending: Specialist | Admitting: Specialist

## 2023-12-15 DIAGNOSIS — R1032 Left lower quadrant pain: Secondary | ICD-10-CM

## 2023-12-15 DIAGNOSIS — K760 Fatty (change of) liver, not elsewhere classified: Secondary | ICD-10-CM | POA: Diagnosis not present

## 2023-12-15 MED ORDER — IOHEXOL 300 MG/ML  SOLN
100.0000 mL | Freq: Once | INTRAMUSCULAR | Status: AC | PRN
  Administered 2023-12-15: 100 mL via INTRAVENOUS

## 2023-12-15 MED ORDER — BARIUM SULFATE 2 % PO SUSP
450.0000 mL | Freq: Once | ORAL | Status: AC
  Administered 2023-12-15: 900 mL via ORAL

## 2024-01-17 ENCOUNTER — Other Ambulatory Visit (HOSPITAL_COMMUNITY): Payer: Self-pay

## 2024-01-17 MED FILL — Tamoxifen Citrate Tab 20 MG (Base Equivalent): ORAL | 90 days supply | Qty: 90 | Fill #2 | Status: AC

## 2024-01-31 NOTE — Telephone Encounter (Signed)
 Patient has been scheduled. Aware of appt date and time.

## 2024-03-17 ENCOUNTER — Other Ambulatory Visit (HOSPITAL_COMMUNITY): Payer: Self-pay

## 2024-03-18 ENCOUNTER — Other Ambulatory Visit: Payer: Self-pay

## 2024-03-18 ENCOUNTER — Other Ambulatory Visit (HOSPITAL_COMMUNITY): Payer: Self-pay

## 2024-03-18 MED ORDER — CELECOXIB 200 MG PO CAPS
ORAL_CAPSULE | ORAL | 1 refills | Status: AC
  Filled 2024-03-18: qty 90, 90d supply, fill #0
  Filled 2024-06-05: qty 90, 90d supply, fill #1

## 2024-03-18 MED ORDER — SYNTHROID 175 MCG PO TABS
175.0000 ug | ORAL_TABLET | Freq: Every morning | ORAL | 3 refills | Status: AC
  Filled 2024-03-18: qty 90, 90d supply, fill #0
  Filled 2024-06-05: qty 90, 90d supply, fill #1

## 2024-03-19 ENCOUNTER — Other Ambulatory Visit: Payer: Self-pay

## 2024-04-28 ENCOUNTER — Other Ambulatory Visit (HOSPITAL_COMMUNITY): Payer: Self-pay

## 2024-04-28 MED FILL — Tamoxifen Citrate Tab 20 MG (Base Equivalent): ORAL | 90 days supply | Qty: 90 | Fill #3 | Status: AC

## 2024-06-05 ENCOUNTER — Other Ambulatory Visit (HOSPITAL_COMMUNITY): Payer: Self-pay

## 2024-06-05 MED ORDER — ESCITALOPRAM OXALATE 10 MG PO TABS
10.0000 mg | ORAL_TABLET | Freq: Every day | ORAL | 1 refills | Status: AC
  Filled 2024-06-05: qty 90, 90d supply, fill #0

## 2024-06-06 ENCOUNTER — Other Ambulatory Visit: Payer: Self-pay

## 2024-06-06 ENCOUNTER — Other Ambulatory Visit (HOSPITAL_COMMUNITY): Payer: Self-pay

## 2024-06-07 ENCOUNTER — Other Ambulatory Visit: Payer: Self-pay

## 2024-06-07 ENCOUNTER — Inpatient Hospital Stay: Admitting: Oncology

## 2024-06-07 NOTE — Progress Notes (Incomplete)
 " ADDENDUM: Her colon polyps reveal fragments of tubular adenoma but no high grade dysplasia. Dr. Charlanne will follow up.                                   [facility]  [address] [place],  [place]  [address] [phone]  Patient Care Team: Benson Eleanor Rung (Inactive) as PCP - General (Internal Medicine) Cornelius Wanda DEL, MD as Consulting Physician (Oncology) Jomarie Agent, MD as Consulting Physician (Radiation Oncology) Vanderbilt Ned, MD as Consulting Physician (General Surgery)  Clinic Day:  06/07/2024  Referring physician: No ref. 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  Due to a high EP Clin score, she received adjuvant TAC chemotherapy, and completed 6 cycles in February 2021.  FSH and estradiol  were both well within the postmenopausal phase, so she was recommended for adjuvant hormonal therapy with an aromatase inhibitor.  She did not tolerate any of the three aromatase inhibitors. Her only other option for endocrine therapy was tamoxifen  and she started this in December 2021. She continues to tolerate tamoxifen  without significant difficulty.   Nodules of the bilateral reconstructions  These are felt to be necrotic tissue from her reconstruction.  Most recent ultrasound revealed benign changes, and I feel 3 stable nodules today. She continues to follow with Dr. Arelia and will see soon.    Plan:   She continues Tamoxifen  without difficulty. She will be scheduling her bone density here soon. She had a colonoscopy done on 12/05/2022 and two 2-62mm polyps at the hepatic flexure were found and removed. A small incidental lipoma in the mid ascending colon was also found. Pathology revealed a tubular adenoma with no high-grade dysplasia. She will have repeat colonoscopy in 3 years. Her PCP does routine blood  work. I will see her back in 1 year. We can then discuss whether to stop the Tamoxifen  for up to 5 years or 10 years.   The patient understands the plans discussed today and is in agreement with them.  She knows to contact our office if she develops concerns prior to her next appointment.  I provided *** minutes of face-to-face time during this this encounter and > 50% was spent counseling as documented under my assessment and plan.   Wanda DEL Cornelius, MD  [facility] - A DEPT OF [facility] [address] [place] [address] Dept: [phone] Dept Fax: [phone]   No orders of the defined types were placed in this encounter.   CHIEF COMPLAINT:  CC: history of breast cancer   Current Treatment:  Tamoxifen   INTERVAL HISTORY:  Natalie Burton is here today for repeat clinical assessment for her history of breast cancer. She was diagnosed in July, 2020. Patient states that she feels *** and ***.     Her CT coronary morph with CTA on 10/19/2023 revealed no acute findings and minimal linear scarring over the left base. She had a CT abdomen and pelvis on 12/15/2023 which revealed no acute intra-abdominal or pelvic abnormality.   She completes routine labs with her PCP.     I will see her back in *** with ***.   She denies fever, chills, night sweats, or other signs of infection. She denies cardiorespiratory and gastrointestinal  issues. She  denies pain. Her appetite is *** and Her weight {Weight change:10426}.  Wt Readings from Last 3 Encounters:  11/21/23 273 lb 12.8 oz (124.2 kg)  09/21/23 270 lb (122.5 kg)  06/09/23 257 lb 9.6 oz (116.8 kg)    Patient states that she feels well and has no complaints of pain. She continues Tamoxifen  without difficulty. She will be scheduling her bone density here soon. She had a colonoscopy done on 12/05/2022 and two 2-61mm polyps at the hepatic flexure were found and removed. A small  incidental lipoma in the mid ascending colon was also found. Pathology revealed a tubular adenoma with no high-grade dysplasia. She will have repeat colonoscopy in 3 years. Her PCP does routine blood work. I will see her back in 1 year. We can then discuss whether to stop the Tamoxifen  for up to 5 years or 10 years.   She denies signs of infection such as sore throat, sinus drainage, cough, or urinary symptoms.  She denies fevers or recurrent chills. She denies pain. She denies nausea, vomiting, chest pain, dyspnea or cough. Her appetite is good and her weight has increased 17 pounds over last 6 months.  I have reviewed the past medical history, past surgical history, social history and family history with the patient and they are unchanged from previous note.   ALLERGIES:  is allergic to bupropion hcl, venlafaxine, sertraline, sertraline hcl, codeine, morphine and codeine, and tizanidine hydrochloride.  MEDICATIONS:  Current Outpatient Medications  Medication Sig Dispense Refill   celecoxib  (CELEBREX ) 200 MG capsule Take 1 capsule (200 mg total) by mouth daily.  Indications: joint damage causing pain and loss of function. 90 capsule 1   cyclobenzaprine  (FLEXERIL ) 10 MG tablet Take 1 tablet (10 mg total) by mouth 3 (three) times daily as needed for muscle spasms corbin.). (Patient taking differently: Take 10 mg by mouth 3 (three) times daily as needed for muscle spasms.) 30 tablet 3   escitalopram  (LEXAPRO ) 10 MG tablet Take 2 tablets (20 mg total) by mouth daily. 180 tablet 1   escitalopram  (LEXAPRO ) 10 MG tablet Take 1 tablet (10 mg total) by mouth daily. 90 tablet 1   esomeprazole  (NEXIUM ) 40 MG capsule Take 1 capsule (40 mg total) by mouth 2 (two) times daily. 180 capsule 3   esomeprazole  (NEXIUM ) 40 MG capsule Take 1 capsule (40 mg total) by mouth 2 (two) times daily. 180 capsule 3   LINZESS 145 MCG CAPS capsule Take 145 mcg by mouth daily as needed for constipation.     metoprolol   tartrate (LOPRESSOR ) 100 MG tablet Take one tablet 2 hours before cardiac CT for heart greater than 55 1 tablet 0   pravastatin  (PRAVACHOL ) 20 MG tablet Take 1 tablet (20 mg total) by mouth every evening. 90 tablet 3   rizatriptan  (MAXALT ) 5 MG tablet Take 1 tablet (5 mg total) by mouth as needed for migraine. May repeat in 2 hours if needed. (Patient taking differently: Take 5 mg by mouth as needed for migraine.) 10 tablet 0   SYNTHROID  175 MCG tablet Take 1 tablet (175 mcg total) by mouth in the morning. 90 tablet 3   tamoxifen  (NOLVADEX ) 20 MG tablet Take 1 tablet (20 mg total) by mouth daily. 90 tablet 3   traMADol (ULTRAM) 50 MG tablet Take 50 mg by mouth every 6 (six) hours as needed for moderate pain (pain score 4-6).     No current facility-administered medications for this visit.    HISTORY OF  PRESENT ILLNESS:   Oncology History  Breast cancer of lower-outer quadrant of right female breast (HCC)  11/27/2018 Initial Diagnosis   Breast cancer of lower-outer quadrant of right female breast (HCC)   11/27/2018 Cancer Staging   Staging form: Breast, AJCC 8th Edition - Clinical stage from 11/27/2018: Stage IIA (cT2, cN0(sn), cM0, G3, ER+, PR+, HER2-) - Signed by Cornelius Wanda DEL, MD on 07/27/2020 Histopathologic type: Infiltrating duct carcinoma, NOS Stage prefix: Initial diagnosis Method of lymph node assessment: Sentinel lymph node biopsy Nuclear grade: G3 Multigene prognostic tests performed: EndoPredict Histologic grading system: 3 grade system Laterality: Right Tumor size (mm): 32 Lymph-vascular invasion (LVI): LVI not present (absent)/not identified Diagnostic confirmation: Positive histology Specimen type: Excision Staged by: Managing physician Menopausal status: Postmenopausal EndoPredict EPclin risk score: 4.7 EndoPredict EPclin risk level: High risk EndoPredict 10-year percentage risk of distant recurrence (%): 33 EndoPredict 10-year risk of distant recurrence: High  risk Stage used in treatment planning: Yes National guidelines used in treatment planning: Yes Type of national guideline used in treatment planning: NCCN Staging comments: Bilateral mast & reconstruction, TAC x 6, hormonal therapy     REVIEW OF SYSTEMS:  Review of Systems  Constitutional: Negative.  Negative for appetite change, chills, diaphoresis, fatigue, fever and unexpected weight change.  HENT:  Negative.  Negative for hearing loss, lump/mass, mouth sores, nosebleeds, sore throat, tinnitus, trouble swallowing and voice change.   Eyes: Negative.  Negative for eye problems and icterus.  Respiratory: Negative.  Negative for chest tightness, cough, hemoptysis, shortness of breath and wheezing.   Cardiovascular: Negative.  Negative for chest pain, leg swelling and palpitations.  Gastrointestinal: Negative.  Negative for abdominal distention, abdominal pain, blood in stool, constipation, diarrhea, nausea, rectal pain and vomiting.  Endocrine: Negative.   Genitourinary: Negative.  Negative for bladder incontinence, difficulty urinating, dyspareunia, dysuria, frequency, hematuria, menstrual problem, nocturia, pelvic pain, vaginal bleeding and vaginal discharge.   Musculoskeletal:  Negative for arthralgias, back pain, flank pain, gait problem, myalgias, neck pain and neck stiffness.  Skin: Negative.  Negative for itching, rash and wound.  Neurological: Negative.  Negative for dizziness, extremity weakness, gait problem, headaches, light-headedness, numbness, seizures and speech difficulty.  Hematological: Negative.  Negative for adenopathy. Does not bruise/bleed easily.  Psychiatric/Behavioral: Negative.  Negative for confusion, decreased concentration, depression, sleep disturbance and suicidal ideas. The patient is not nervous/anxious.   All other systems reviewed and are negative.   VITALS:  There were no vitals taken for this visit.  Wt Readings from Last 3 Encounters:  11/21/23 273 lb  12.8 oz (124.2 kg)  09/21/23 270 lb (122.5 kg)  06/09/23 257 lb 9.6 oz (116.8 kg)    There is no height or weight on file to calculate BMI.  Performance status (ECOG): 1 - Symptomatic but completely ambulatory  PHYSICAL EXAM:  Physical Exam Vitals and nursing note reviewed.  Constitutional:      General: She is not in acute distress.    Appearance: Normal appearance. She is normal weight. She is not ill-appearing, toxic-appearing or diaphoretic.  HENT:     Head: Normocephalic and atraumatic.     Right Ear: Tympanic membrane, ear canal and external ear normal. There is no impacted cerumen.     Left Ear: Tympanic membrane, ear canal and external ear normal. There is no impacted cerumen.     Nose: Nose normal. No congestion or rhinorrhea.     Mouth/Throat:     Mouth: Mucous membranes are moist.     Pharynx:  Oropharynx is clear. No oropharyngeal exudate or posterior oropharyngeal erythema.  Eyes:     General: No scleral icterus.       Right eye: No discharge.        Left eye: No discharge.     Extraocular Movements: Extraocular movements intact.     Conjunctiva/sclera: Conjunctivae normal.     Pupils: Pupils are equal, round, and reactive to light.  Neck:     Vascular: No carotid bruit.  Cardiovascular:     Rate and Rhythm: Normal rate and regular rhythm.     Pulses: Normal pulses.     Heart sounds: Normal heart sounds. No murmur heard.    No friction rub. No gallop.  Pulmonary:     Effort: Pulmonary effort is normal. No respiratory distress.     Breath sounds: Normal breath sounds. No stridor. No wheezing, rhonchi or rales.  Chest:     Chest wall: No tenderness.     Comments: Stable approximately 1cm nodule in the upper outer quadrant of the right reconstruction 2 nodules in the upper outer left reconstruction that are less than 1cm.  Abdominal:     General: Bowel sounds are normal. There is no distension.     Palpations: Abdomen is soft. There is no hepatomegaly,  splenomegaly or mass.     Tenderness: There is no abdominal tenderness. There is no right CVA tenderness, left CVA tenderness, guarding or rebound.     Hernia: No hernia is present.  Musculoskeletal:        General: No swelling, tenderness, deformity or signs of injury. Normal range of motion.     Cervical back: Normal range of motion and neck supple. No rigidity or tenderness.     Right lower leg: No edema.     Left lower leg: No edema.  Lymphadenopathy:     Cervical: No cervical adenopathy.     Right cervical: No superficial, deep or posterior cervical adenopathy.    Left cervical: No superficial, deep or posterior cervical adenopathy.     Upper Body:     Right upper body: No supraclavicular, axillary or pectoral adenopathy.     Left upper body: No supraclavicular, axillary or pectoral adenopathy.  Skin:    General: Skin is warm and dry.     Coloration: Skin is not jaundiced or pale.     Findings: No bruising, erythema, lesion or rash.  Neurological:     General: No focal deficit present.     Mental Status: She is alert and oriented to person, place, and time. Mental status is at baseline.     Cranial Nerves: No cranial nerve deficit.     Sensory: No sensory deficit.     Motor: No weakness.     Coordination: Coordination normal.     Gait: Gait normal.     Deep Tendon Reflexes: Reflexes normal.  Psychiatric:        Mood and Affect: Mood normal.        Behavior: Behavior normal.        Thought Content: Thought content normal.        Judgment: Judgment normal.      LABORATORY DATA:  I have reviewed the data as listed    Component Value Date/Time   NA 139 06/08/2022 1437   NA 138 12/09/2021 0000   K 3.5 06/08/2022 1437   CL 102 06/08/2022 1437   CO2 26 06/08/2022 1437   GLUCOSE 104 (H) 06/08/2022 1437   BUN 11 06/08/2022  1437   BUN 13 12/09/2021 0000   CREATININE 0.89 06/08/2022 1437   CALCIUM  9.1 06/08/2022 1437   PROT 7.0 06/08/2022 1437   ALBUMIN 3.8 06/08/2022  1437   AST 16 06/08/2022 1437   ALT 28 06/08/2022 1437   ALKPHOS 51 06/08/2022 1437   BILITOT 0.5 06/08/2022 1437   GFRNONAA >60 06/08/2022 1437   GFRAA >60 07/30/2019 0901    No results found for: SPEP, UPEP  Lab Results  Component Value Date   WBC 7.1 06/08/2022   NEUTROABS 4.5 06/08/2022   HGB 13.7 06/08/2022   HCT 41.0 06/08/2022   MCV 93.4 06/08/2022   PLT 264 06/08/2022      Chemistry      Component Value Date/Time   NA 139 06/08/2022 1437   NA 138 12/09/2021 0000   K 3.5 06/08/2022 1437   CL 102 06/08/2022 1437   CO2 26 06/08/2022 1437   BUN 11 06/08/2022 1437   BUN 13 12/09/2021 0000   CREATININE 0.89 06/08/2022 1437   GLU 92 12/09/2021 0000      Component Value Date/Time   CALCIUM  9.1 06/08/2022 1437   ALKPHOS 51 06/08/2022 1437   AST 16 06/08/2022 1437   ALT 28 06/08/2022 1437   BILITOT 0.5 06/08/2022 1437     RADIOGRAPHIC STUDIES:  EXAM: 12/15/2023 CT ABDOMEN AND PELVIS WITH CONTRAST IMPRESSION: No acute intra-abdominal or pelvic abnormality.  EXAM: 10/19/2023 OVER-READ INTERPRETATION  CT CHEST IMPRESSION: 1. No acute findings. 2. Minimal linear scarring over the left base.  Natalie Burton Aretta Cook, acting as a scribe for Wanda VEAR Cornish, MD, have documented all relevant documentation on the behalf of Wanda VEAR Cornish, MD, as directed by Wanda VEAR Cornish, MD, while in the presence of Wanda VEAR Cornish, MD.  I, Wanda VEAR Cornish, MD , have reviewed this report as typed by the medical scribe, and it is complete and accurate.  "

## 2024-07-05 ENCOUNTER — Inpatient Hospital Stay: Admitting: Oncology
